# Patient Record
Sex: Female | Born: 1940 | Race: White | Hispanic: No | Smoking: Former smoker
Health system: Southern US, Community
[De-identification: ages and names within clinical notes are randomized; demographics above are authoritative.]

## PROBLEM LIST (undated history)

## (undated) DIAGNOSIS — E785 Hyperlipidemia, unspecified: Secondary | ICD-10-CM

## (undated) DIAGNOSIS — N3281 Overactive bladder: Secondary | ICD-10-CM

## (undated) DIAGNOSIS — K5790 Diverticulosis of intestine, part unspecified, without perforation or abscess without bleeding: Secondary | ICD-10-CM

## (undated) DIAGNOSIS — M810 Age-related osteoporosis without current pathological fracture: Secondary | ICD-10-CM

## (undated) DIAGNOSIS — F411 Generalized anxiety disorder: Secondary | ICD-10-CM

## (undated) DIAGNOSIS — N952 Postmenopausal atrophic vaginitis: Secondary | ICD-10-CM

## (undated) DIAGNOSIS — K635 Polyp of colon: Secondary | ICD-10-CM

## (undated) DIAGNOSIS — L409 Psoriasis, unspecified: Secondary | ICD-10-CM

## (undated) DIAGNOSIS — F17200 Nicotine dependence, unspecified, uncomplicated: Secondary | ICD-10-CM

## (undated) HISTORY — PX: TUBAL LIGATION: SHX77

## (undated) HISTORY — DX: Hyperlipidemia, unspecified: E78.5

## (undated) HISTORY — DX: Polyp of colon: K63.5

## (undated) HISTORY — DX: Nicotine dependence, unspecified, uncomplicated: F17.200

## (undated) HISTORY — DX: Generalized anxiety disorder: F41.1

## (undated) HISTORY — DX: Diverticulosis of intestine, part unspecified, without perforation or abscess without bleeding: K57.90

## (undated) HISTORY — DX: Psoriasis, unspecified: L40.9

## (undated) HISTORY — DX: Postmenopausal atrophic vaginitis: N95.2

## (undated) HISTORY — PX: APPENDECTOMY: SHX54

## (undated) HISTORY — DX: Overactive bladder: N32.81

## (undated) HISTORY — DX: Age-related osteoporosis without current pathological fracture: M81.0

---

## 1970-11-01 HISTORY — PX: ABDOMINAL HYSTERECTOMY: SHX81

## 1998-04-01 HISTORY — PX: PUBOVAGINAL SLING: SHX1035

## 1998-04-07 ENCOUNTER — Ambulatory Visit (HOSPITAL_COMMUNITY): Admission: RE | Admit: 1998-04-07 | Discharge: 1998-04-08 | Payer: Self-pay | Admitting: Urology

## 1999-03-24 ENCOUNTER — Ambulatory Visit (HOSPITAL_COMMUNITY): Admission: RE | Admit: 1999-03-24 | Discharge: 1999-03-24 | Payer: Self-pay | Admitting: Gastroenterology

## 1999-11-13 ENCOUNTER — Other Ambulatory Visit: Admission: RE | Admit: 1999-11-13 | Discharge: 1999-11-13 | Payer: Self-pay | Admitting: Obstetrics and Gynecology

## 2004-09-07 ENCOUNTER — Encounter: Admission: RE | Admit: 2004-09-07 | Discharge: 2004-09-07 | Payer: Self-pay | Admitting: Family Medicine

## 2004-12-25 HISTORY — PX: CARDIOVASCULAR STRESS TEST: SHX262

## 2005-04-23 ENCOUNTER — Ambulatory Visit (HOSPITAL_COMMUNITY): Admission: RE | Admit: 2005-04-23 | Discharge: 2005-04-23 | Payer: Self-pay | Admitting: Gastroenterology

## 2005-07-23 ENCOUNTER — Other Ambulatory Visit: Admission: RE | Admit: 2005-07-23 | Discharge: 2005-07-23 | Payer: Self-pay | Admitting: Family Medicine

## 2006-04-01 ENCOUNTER — Ambulatory Visit: Payer: Self-pay | Admitting: Family Medicine

## 2006-06-09 ENCOUNTER — Ambulatory Visit: Payer: Self-pay | Admitting: Family Medicine

## 2007-06-01 ENCOUNTER — Ambulatory Visit: Payer: Self-pay | Admitting: Family Medicine

## 2007-07-19 ENCOUNTER — Ambulatory Visit: Payer: Self-pay | Admitting: Family Medicine

## 2007-07-19 ENCOUNTER — Encounter: Admission: RE | Admit: 2007-07-19 | Discharge: 2007-07-19 | Payer: Self-pay | Admitting: Family Medicine

## 2007-08-23 ENCOUNTER — Ambulatory Visit: Payer: Self-pay | Admitting: Family Medicine

## 2008-02-01 ENCOUNTER — Ambulatory Visit: Payer: Self-pay | Admitting: Family Medicine

## 2008-04-18 ENCOUNTER — Ambulatory Visit: Payer: Self-pay | Admitting: Family Medicine

## 2008-07-29 ENCOUNTER — Ambulatory Visit: Payer: Self-pay | Admitting: Family Medicine

## 2008-11-28 ENCOUNTER — Ambulatory Visit: Payer: Self-pay | Admitting: Family Medicine

## 2009-01-27 ENCOUNTER — Ambulatory Visit: Payer: Self-pay | Admitting: Family Medicine

## 2009-07-10 ENCOUNTER — Ambulatory Visit: Payer: Self-pay | Admitting: Family Medicine

## 2009-08-14 ENCOUNTER — Ambulatory Visit: Payer: Self-pay | Admitting: Family Medicine

## 2010-05-19 ENCOUNTER — Ambulatory Visit: Payer: Self-pay | Admitting: Family Medicine

## 2010-06-03 ENCOUNTER — Ambulatory Visit: Payer: Self-pay | Admitting: Physician Assistant

## 2010-08-01 HISTORY — PX: COLONOSCOPY: SHX174

## 2010-08-31 LAB — HM COLONOSCOPY

## 2010-12-09 ENCOUNTER — Encounter: Payer: Self-pay | Admitting: Physician Assistant

## 2010-12-23 ENCOUNTER — Encounter (INDEPENDENT_AMBULATORY_CARE_PROVIDER_SITE_OTHER): Payer: Medicare Other | Admitting: Family Medicine

## 2010-12-23 DIAGNOSIS — E119 Type 2 diabetes mellitus without complications: Secondary | ICD-10-CM

## 2010-12-23 DIAGNOSIS — Z23 Encounter for immunization: Secondary | ICD-10-CM

## 2010-12-23 DIAGNOSIS — F329 Major depressive disorder, single episode, unspecified: Secondary | ICD-10-CM

## 2010-12-23 DIAGNOSIS — E7800 Pure hypercholesterolemia, unspecified: Secondary | ICD-10-CM

## 2011-03-19 NOTE — Op Note (Signed)
Marissa Oliver, Marissa Oliver                ACCOUNT NO.:  [id number]   MEDICAL RECORD NO.:  [id number]          PATIENT TYPE:  AMB   LOCATION:  ENDO                         FACILITY:  [facility]   PHYSICIAN:  Anselmo Rod, M.D.  DATE OF BIRTH:  [birth year]   DATE OF PROCEDURE:  04/23/2005  DATE OF DISCHARGE:                                 OPERATIVE REPORT   PROCEDURE PERFORMED:  Colonoscopy.   ENDOSCOPIST:  Charna Elizabeth, M.D.   INSTRUMENT USED:  Olympus video colonoscope.   INDICATIONS FOR PROCEDURE:  The patient is a 70 year old white female with a  personal history of polyps, undergoing a repeat colonoscopy to rule out  recurrent polyps.   PREPROCEDURE PREPARATION:  Informed consent was procured from the patient.  The patient was fasted for eight hours prior to the procedure and prepped  with a bottle of magnesium citrate and a gallon of GoLYTELY the night prior  to the procedure.  The risks and benefits of the procedure including a 10%  miss rate for colon polyps or cancers was discussed with the patient as  well.   PREPROCEDURE PHYSICAL:  The patient had stable vital signs.  Neck supple.  Chest clear to auscultation.  S1 and S2 regular.  Abdomen soft with normal  bowel sounds.   DESCRIPTION OF PROCEDURE:  The patient was placed in left lateral decubitus  position and sedated with 100 mg of Demerol and 10 mg of Versed in slow  incremental doses.  Once the patient was adequately sedated and maintained  on low flow oxygen and continuous cardiac monitoring, the Olympus video  colonoscope was advanced from the rectum to the cecum.  The appendicular  orifice and ileocecal valve were visualized and photographed.  There was  some inspissated stool in the appendicular orifice.  A few sigmoid  diverticula were seen, small internal hemorrhoids were appreciated on  retroflexion in the rectum.  No masses or polyps were identified.  The  patient tolerated the procedure well without  complication.   IMPRESSION:  1.  Small internal hemorrhoids.  2.  Sigmoid diverticulosis.  3.  No masses or polyps seen.   RECOMMENDATIONS:  1.  Continue high fiber diet with liberal fluid intake.  Brochures on      diverticulosis have been given to the patient for her education.  2.  Repeat colonoscopy is recommended in the next five years considering her      history of colonic polyps.  3.  She is to contact the office if she develops any abnormal symptoms prior      to that so that early evaluation can be undertaken.  4.  Outpatient followup as need arises in the future.       JNM/MEDQ  D:  04/23/2005  T:  04/23/2005  Job:  [id number]   cc:   Sharlot Gowda, M.D.  [address]  [place], [place] [address]  Fax: [phone]

## 2011-07-23 ENCOUNTER — Encounter: Payer: Self-pay | Admitting: Family Medicine

## 2011-07-26 ENCOUNTER — Other Ambulatory Visit: Payer: Medicare Other

## 2011-07-26 DIAGNOSIS — I10 Essential (primary) hypertension: Secondary | ICD-10-CM

## 2011-07-26 DIAGNOSIS — E119 Type 2 diabetes mellitus without complications: Secondary | ICD-10-CM

## 2011-07-26 DIAGNOSIS — E785 Hyperlipidemia, unspecified: Secondary | ICD-10-CM

## 2011-07-26 LAB — COMPREHENSIVE METABOLIC PANEL
BUN: 16 mg/dL (ref 6–23)
CO2: 25 mEq/L (ref 19–32)
Calcium: 9.6 mg/dL (ref 8.4–10.5)
Chloride: 103 mEq/L (ref 96–112)
Creat: 0.76 mg/dL (ref 0.50–1.10)
Total Bilirubin: 0.5 mg/dL (ref 0.3–1.2)

## 2011-07-26 LAB — LIPID PANEL
Cholesterol: 182 mg/dL (ref 0–200)
HDL: 53 mg/dL (ref >39)
Total CHOL/HDL Ratio: 3.4 Ratio
Triglycerides: 137 mg/dL (ref <150)
VLDL: 27 mg/dL (ref 0–40)

## 2011-07-27 NOTE — Progress Notes (Signed)
Patient scheduled for OV 08/11/11 @ 10:15.

## 2011-08-02 ENCOUNTER — Encounter: Payer: Self-pay | Admitting: Cardiovascular Disease

## 2011-08-12 ENCOUNTER — Ambulatory Visit (INDEPENDENT_AMBULATORY_CARE_PROVIDER_SITE_OTHER): Payer: Medicare Other | Admitting: Family Medicine

## 2011-08-12 ENCOUNTER — Encounter: Payer: Self-pay | Admitting: Family Medicine

## 2011-08-12 VITALS — BP 108/68 | HR 68 | Ht 62.0 in | Wt 175.0 lb

## 2011-08-12 DIAGNOSIS — Z23 Encounter for immunization: Secondary | ICD-10-CM

## 2011-08-12 DIAGNOSIS — F411 Generalized anxiety disorder: Secondary | ICD-10-CM

## 2011-08-12 DIAGNOSIS — M810 Age-related osteoporosis without current pathological fracture: Secondary | ICD-10-CM | POA: Insufficient documentation

## 2011-08-12 DIAGNOSIS — E7800 Pure hypercholesterolemia, unspecified: Secondary | ICD-10-CM

## 2011-08-12 DIAGNOSIS — E119 Type 2 diabetes mellitus without complications: Secondary | ICD-10-CM | POA: Insufficient documentation

## 2011-08-12 MED ORDER — PRAVASTATIN SODIUM 80 MG PO TABS: 80.0000 mg | ORAL_TABLET | Freq: Every day | ORAL | Status: DC

## 2011-08-12 MED ORDER — METFORMIN HCL 500 MG PO TABS: 500.0000 mg | ORAL_TABLET | Freq: Every day | ORAL | Status: DC

## 2011-08-12 MED ORDER — CITALOPRAM HYDROBROMIDE 20 MG PO TABS: 20.0000 mg | ORAL_TABLET | Freq: Every day | ORAL | Status: DC

## 2011-08-12 MED ORDER — ALENDRONATE SODIUM 70 MG PO TABS: 70.0000 mg | ORAL_TABLET | ORAL | Status: DC

## 2011-08-12 NOTE — Patient Instructions (Signed)
Please schedule your bone density test Please work on cutting back on your smoking, and quitting entirely Please try and watch the cholesterol a little more closely in your diet-- your cholesterol was higher than at last check  Low-Fat, Low-Saturated-Fat, Low-Cholesterol Diets Food Selection Guide BREADS, CEREALS, PASTA, RICE, DRIED PEAS, AND BEANS These products are high in carbohydrates and most are low in fat. Therefore, they can be increased in the diet as substitutes for fatty foods. They too, however, contain calories and should not be eaten in excess. Cereals can be eaten for snacks as well as for breakfast.  Include foods that contain fiber (fruits, vegetables, whole grains, and legumes). Research shows that fiber may lower blood cholesterol levels, especially the water-soluble fiber found in fruits, vegetables, oat products, and legumes. FRUITS AND VEGETABLES It is good to eat fruits and vegetables. Besides being sources of fiber, both are rich in vitamins and some minerals. They help you get the daily allowances of these nutrients. Fruits and vegetables can be used for snacks and desserts. MEATS Limit lean meat, chicken, [place], and fish to no more than 6 ounces per day. Beef, Pork, and Lamb  Use lean cuts of beef, pork, and lamb. Lean cuts include:   Extra-lean ground beef.   Arm roast.   Sirloin tip.   Center-cut ham.   Round steak.   Loin chops.   Rump roast.   Tenderloin.  Trim all fat off the outside of meats before cooking. It is not necessary to severely decrease the intake of red meat, but lean choices should be made. Lean meat is rich in protein and contains a highly absorbable form of iron. Premenopausal women, in particular, should avoid reducing lean red meat because this could increase the risk for low red blood cells (iron-deficiency anemia). Processed Meats Processed meats, such as bacon, bologna, salami, sausage, and hot dogs contain large quantities of  fat, are not rich in valuable nutrients, and should not be eaten very often. Organ Meats The organ meats, such as liver, sweetbreads, kidneys, and brain are very rich in cholesterol. They should be limited. Chicken and [place] These are good sources of protein. The fat of poultry can be reduced by removing the skin and underlying fat layers before cooking. Chicken and [place] can be substituted for lean red meat in the diet. Poultry should not be fried or covered with high-fat sauces. Fish and Shellfish Fish is a good source of protein. Shellfish contain cholesterol, but they usually are low in saturated fatty acids. The preparation of fish is important. Like chicken and [place], they should not be fried or covered with high-fat sauces. EGGS Egg yolks often are hidden in cooked and processed foods. Egg whites contain no fat or cholesterol. They can be eaten often. Try 1 to 2 egg whites instead of whole eggs in recipes or use egg substitutes that do not contain yolk. MILK AND DAIRY PRODUCTS Use skim or 1% milk instead of 2% or whole milk. Decrease whole milk, natural, and processed cheeses. Use nonfat or low-fat (2%) cottage cheese or low-fat cheeses made from vegetable oils. Choose nonfat or low-fat (1 to 2%) yogurt. Experiment with evaporated skim milk in recipes that call for heavy cream. Substitute low-fat yogurt or low-fat cottage cheese for sour cream in dips and salad dressings. Have at least 2 servings of low-fat dairy products, such as 2 glasses of skim (or 1%) milk each day to help get your daily calcium intake. FATS AND OILS Reduce the  total intake of fats, especially saturated fat. Butterfat, lard, and beef fats are high in saturated fat and cholesterol. These should be avoided as much as possible. Vegetable fats do not contain cholesterol, but certain vegetable fats, such as coconut oil, palm oil, and palm kernel oil are very high in saturated fats. These should be limited. These fats are often  used in bakery goods, processed foods, popcorn, oils, and nondairy creamers. Vegetable shortenings and some peanut butters contain hydrogenated oils, which are also saturated fats. Read the labels on these foods and check for saturated vegetable oils. Unsaturated vegetable oils and fats do not raise blood cholesterol. However, they should be limited because they are fats and are high in calories. Total fat should still be limited to 30% of your daily caloric intake. Desirable liquid vegetable oils are corn oil, cottonseed oil, olive oil, canola oil, safflower oil, soybean oil, and sunflower oil. Peanut oil is not as good, but small amounts are acceptable. Buy a heart-healthy tub margarine that has no partially hydrogenated oils in the ingredients. Mayonnaise and salad dressings often are made from unsaturated fats, but they should also be limited because of their high calorie and fat content. Seeds, nuts, peanut butter, olives, and avocados are high in fat, but the fat is mainly the unsaturated type. These foods should be limited mainly to avoid excess calories and fat. OTHER EATING TIPS Snacks  Most sweets should be limited as snacks. They tend to be rich in calories and fats, and their caloric content outweighs their nutritional value. Some good choices in snacks are graham crackers, melba toast, soda crackers, bagels (no egg), English muffins, fruits, and vegetables. These snacks are preferable to snack crackers, [place] fries, and chips. Popcorn should be air-popped or cooked in small amounts of liquid vegetable oil. Desserts Eat fruit, low-fat yogurt, and fruit ices instead of pastries, cake, and cookies. Sherbet, angel food cake, gelatin dessert, frozen low-fat yogurt, or other frozen products that do not contain saturated fat (pure fruit juice bars, frozen ice pops) are also acceptable.  COOKING METHODS Choose those methods that use little or no fat. They include:  Poaching.   Braising.    Steaming.   Grilling.   Baking.   Stir-frying.   Broiling.   Microwaving.  Foods can be cooked in a nonstick pan without added fat, or use a nonfat cooking spray in regular cookware. Limit fried foods and avoid frying in saturated fat. Add moisture to lean meats by using water, broth, cooking wines, and other nonfat or low-fat sauces along with the cooking methods mentioned above. Soups and stews should be chilled after cooking. The fat that forms on top after a few hours in the refrigerator should be skimmed off. When preparing meals, avoid using excess salt. Salt can contribute to raising blood pressure in some people. EATING AWAY FROM HOME Order entres, potatoes, and vegetables without sauces or butter. When meat exceeds the size of a deck of cards (3 to 4 ounces), the rest can be taken home for another meal. Choose vegetable or fruit salads and ask for low-calorie salad dressings to be served on the side. Use dressings sparingly. Limit high-fat toppings, such as bacon, crumbled eggs, cheese, sunflower seeds, and olives. Ask for heart-healthy tub margarine instead of butter. Document Released: 04/09/2002 Document Re-Released: 01/12/2010 [facility] Patient Information 2011 [place], [place].

## 2011-08-12 NOTE — Progress Notes (Signed)
Patient presents for med check.  She was last seen in February (late for her 6 month f/u). She continues to smoke.  Ex-husband has stage 3 lung cancer.  Smokes a couple of packs each week, and has questions regarding nicotine patches.  Diabetes follow-up:  Blood sugars are not checked.  Denies hypoglycemia.  Denies polydipsia and polyuria.  Last eye exam was January 2012.  Patient follows a low sugar diet and checks feet regularly without concerns.  Reports some throbbing pain in her left lateral calf.  Notices when she is sleeping at night, and sometimes during the day, and sometimes gets cramps in her feet.  Seeing Dr. Elease Hashimoto next week because she is having some palpitations and heart fluttering.  Drinking 4-5 cups/day.  Hyperlipidemia follow-up:  Patient admits to not following a low-fat, low cholesterol diet.  Compliant with medications and denies medication side effects. Admits to eating too many sweets and high fat foods.  Cholesterol went up 20 points since last check in February, and LDL is now 102.  Eggs once a week; Red meats 2-3x/week; +ranch dressing  Anxiety/depression is stable.  Continues to be easily irritated by her brother, but overall is doing fine.  Past Medical History  Diagnosis Date  . Dyslipidemia   . Diabetes mellitus 2005  . Smoker   . Hemorrhoids   . OAB (overactive bladder)   . Osteoporosis 07/2009  . GAD (generalized anxiety disorder)   . Diverticulosis   . Colon polyp     tubular adenoma (colonoscopy q5 yrs)  . Atrophic vaginitis     estrace cream rx'd by Dr. Vernie Ammons    Past Surgical History  Procedure Date  . Abdominal hysterectomy 1972    partial  . Pubovaginal sling 04/1998  . Tubal ligation   . Appendectomy   . Cardiovascular stress test 12/25/2004    EF 65%. OVERALL NORMAL STRESS CARDIOLITE. NO EVIDENCE  OF ISCHEMIA  . Colonoscopy 08/2010    History   Social History  . Marital Status: Single    Spouse Name: N/A    Number of Children: N/A  .  Years of Education: N/A   Occupational History  . Not on file.   Social History Main Topics  . Smoking status: Current Everyday Smoker -- 0.5 packs/day  . Smokeless tobacco: Never Used  . Alcohol Use: No  . Drug Use: No  . Sexually Active: Not on file   Other Topics Concern  . Not on file   Social History Narrative  . No narrative on file    Family History  Problem Relation Age of Onset  . Arthritis Mother   . Heart failure Mother   . Mental illness Father   . Kidney disease Father   . Stroke Father   . Heart attack Father   . Heart disease Brother   . Stroke Maternal Aunt   . Cancer Maternal Uncle   . Stroke Paternal Grandmother    Current outpatient prescriptions:alendronate (FOSAMAX) 70 MG tablet, Take 1 tablet (70 mg total) by mouth every 7 (seven) days. Take with a full glass of water on an empty stomach., Disp: 12 tablet, Rfl: 1;  citalopram (CELEXA) 20 MG tablet, Take 1 tablet (20 mg total) by mouth daily., Disp: 90 tablet, Rfl: 1;  metFORMIN (GLUCOPHAGE) 500 MG tablet, Take 1 tablet (500 mg total) by mouth daily., Disp: 90 tablet, Rfl: 1 pravastatin (PRAVACHOL) 80 MG tablet, Take 1 tablet (80 mg total) by mouth daily., Disp: 90 tablet, Rfl:  1;  DISCONTD: citalopram (CELEXA) 20 MG tablet, Take 20 mg by mouth daily.  , Disp: , Rfl: ;  DISCONTD: metFORMIN (GLUCOPHAGE) 500 MG tablet, Take 500 mg by mouth daily.  , Disp: , Rfl: ;  DISCONTD: pravastatin (PRAVACHOL) 80 MG tablet, Take 80 mg by mouth daily.  , Disp: , Rfl:   No Known Allergies  ROS: Denies fevers, URI symptoms, cough, shortness of breath, chest pain. + palpitations.  Denies swelling in feet, nausea, vomiting, diarrhea, skin rashes (other than having psoriasis), depression   PHYSICAL EXAM: BP 108/68  Pulse 68  Ht 5\' 2"  (1.575 m)  Wt 175 lb (79.379 kg)  BMI 32.01 kg/m2 Well developed, pleasant, overweight female in no distress Neck: no lymphadenopathy or thyromegaly Heart: regular rate and rhythm without  murmur Lungs: clear bilaterally Abdomen: soft, nontender, no organomegaly or mass Extremities: 2+ DP and PT pulses, no edema Skin: multiple erythematous papules and plaques, somewhat excoriated (consistent with her psoriasis) Psych: normal mood, affect, hygiene and grooming.  Slightly irritable in discussing politics today  ASSESSMENT/PLAN:  1. Type II or unspecified type diabetes mellitus without mention of complication, not stated as uncontrolled  metFORMIN (GLUCOPHAGE) 500 MG tablet, Hemoglobin A1c, Glucose, random  2. Need for prophylactic vaccination and inoculation against influenza  Flu vaccine greater than or equal to 3yo preservative free IM  3. Osteoporosis  alendronate (FOSAMAX) 70 MG tablet  4. Anxiety state, unspecified  citalopram (CELEXA) 20 MG tablet  5. Pure hypercholesterolemia  pravastatin (PRAVACHOL) 80 MG tablet, Lipid panel, Hepatic function panel   Low cholesterol diet reviewed.  Since lipids were at goal 8 months ago on same meds, will try dietary improvement rather than changing medication. Smoking cessation counseling given Recommended she gradually cut back on caffeine Schedule bone density test  6 months--recheck FLP, hepatic panel and glucose prior to OV

## 2011-08-17 ENCOUNTER — Institutional Professional Consult (permissible substitution): Payer: Medicare Other | Admitting: Cardiovascular Disease

## 2011-09-15 ENCOUNTER — Institutional Professional Consult (permissible substitution): Payer: Medicare Other | Admitting: Cardiovascular Disease

## 2011-10-22 ENCOUNTER — Emergency Department (HOSPITAL_COMMUNITY)
Admission: EM | Admit: 2011-10-22 | Discharge: 2011-10-23 | Disposition: A | Discharge status: Home / Self Care | Payer: Medicare Other | Attending: Emergency Medicine | Admitting: Emergency Medicine

## 2011-10-22 ENCOUNTER — Emergency Department (HOSPITAL_COMMUNITY): Payer: Medicare Other

## 2011-10-22 ENCOUNTER — Encounter (HOSPITAL_COMMUNITY): Payer: Self-pay | Admitting: *Deleted

## 2011-10-22 DIAGNOSIS — M79609 Pain in unspecified limb: Secondary | ICD-10-CM | POA: Insufficient documentation

## 2011-10-22 DIAGNOSIS — F17200 Nicotine dependence, unspecified, uncomplicated: Secondary | ICD-10-CM | POA: Insufficient documentation

## 2011-10-22 DIAGNOSIS — E119 Type 2 diabetes mellitus without complications: Secondary | ICD-10-CM | POA: Insufficient documentation

## 2011-10-22 DIAGNOSIS — M79604 Pain in right leg: Secondary | ICD-10-CM

## 2011-10-22 DIAGNOSIS — M549 Dorsalgia, unspecified: Secondary | ICD-10-CM

## 2011-10-22 DIAGNOSIS — K449 Diaphragmatic hernia without obstruction or gangrene: Secondary | ICD-10-CM | POA: Insufficient documentation

## 2011-10-22 DIAGNOSIS — R112 Nausea with vomiting, unspecified: Secondary | ICD-10-CM | POA: Insufficient documentation

## 2011-10-22 DIAGNOSIS — R1031 Right lower quadrant pain: Secondary | ICD-10-CM | POA: Insufficient documentation

## 2011-10-22 LAB — COMPREHENSIVE METABOLIC PANEL
ALT: 15 U/L (ref 0–35)
AST: 14 U/L (ref 0–37)
Albumin: 4.2 g/dL (ref 3.5–5.2)
Alkaline Phosphatase: 99 U/L (ref 39–117)
BUN: 17 mg/dL (ref 6–23)
CO2: 27 mEq/L (ref 19–32)
Calcium: 9.9 mg/dL (ref 8.4–10.5)
Chloride: 102 mEq/L (ref 96–112)
Creatinine, Ser: 0.7 mg/dL (ref 0.50–1.10)
GFR calc Af Amer: >90 mL/min (ref >90)
GFR calc non Af Amer: 85 mL/min — ABNORMAL LOW (ref >90)
Glucose, Bld: 153 mg/dL — ABNORMAL HIGH (ref 70–99)
Potassium: 3.7 mEq/L (ref 3.5–5.1)
Sodium: 138 mEq/L (ref 135–145)
Total Bilirubin: 0.3 mg/dL (ref 0.3–1.2)
Total Protein: 7.2 g/dL (ref 6.0–8.3)

## 2011-10-22 LAB — URINALYSIS, ROUTINE W REFLEX MICROSCOPIC
Bilirubin Urine: NEGATIVE
Glucose, UA: NEGATIVE mg/dL
Hgb urine dipstick: NEGATIVE
Ketones, ur: 15 mg/dL — AB
Leukocytes, UA: NEGATIVE
Nitrite: NEGATIVE
Protein, ur: NEGATIVE mg/dL
Specific Gravity, Urine: 1.015 (ref 1.005–1.030)
Urobilinogen, UA: 0.2 mg/dL (ref 0.0–1.0)
pH: 8 (ref 5.0–8.0)

## 2011-10-22 LAB — CBC
HCT: 40 % (ref 36.0–46.0)
Hemoglobin: 13.9 g/dL (ref 12.0–15.0)
MCH: 29.7 pg (ref 26.0–34.0)
MCHC: 34.8 g/dL (ref 30.0–36.0)
MCV: 85.5 fL (ref 78.0–100.0)
Platelets: 191 10*3/uL (ref 150–400)
RBC: 4.68 MIL/uL (ref 3.87–5.11)
RDW: 13.3 % (ref 11.5–15.5)
WBC: 6.7 10*3/uL (ref 4.0–10.5)

## 2011-10-22 LAB — LACTIC ACID, PLASMA: Lactic Acid, Venous: 1 mmol/L (ref 0.5–2.2)

## 2011-10-22 LAB — LIPASE, BLOOD: Lipase: 12 U/L (ref 11–59)

## 2011-10-22 MED ORDER — ONDANSETRON HCL 4 MG/2ML IJ SOLN
INTRAMUSCULAR | Status: AC
  Filled 2011-10-22: qty 2

## 2011-10-22 MED ORDER — SODIUM CHLORIDE 0.9 % IV BOLUS (SEPSIS)
1000.0000 mL | Freq: Once | INTRAVENOUS | Status: AC
  Administered 2011-10-22: 1000 mL via INTRAVENOUS

## 2011-10-22 MED ORDER — HYDROMORPHONE HCL PF 2 MG/ML IJ SOLN
2.0000 mg | Freq: Once | INTRAMUSCULAR | Status: AC
  Administered 2011-10-22: 2 mg via INTRAMUSCULAR
  Filled 2011-10-22: qty 1

## 2011-10-22 MED ORDER — HYDROMORPHONE HCL PF 1 MG/ML IJ SOLN
1.0000 mg | Freq: Once | INTRAMUSCULAR | Status: AC
  Administered 2011-10-23: 1 mg via INTRAVENOUS
  Filled 2011-10-22: qty 1

## 2011-10-22 MED ORDER — IOHEXOL 300 MG/ML  SOLN
100.0000 mL | Freq: Once | INTRAMUSCULAR | Status: AC | PRN
  Administered 2011-10-22: 100 mL via INTRAVENOUS

## 2011-10-22 MED ORDER — HYDROMORPHONE HCL PF 1 MG/ML IJ SOLN
1.0000 mg | Freq: Once | INTRAMUSCULAR | Status: AC
  Administered 2011-10-22: 1 mg via INTRAVENOUS
  Filled 2011-10-22: qty 1

## 2011-10-22 MED ORDER — DIAZEPAM 5 MG/ML IJ SOLN
2.5000 mg | Freq: Once | INTRAMUSCULAR | Status: AC
  Administered 2011-10-23: 2.5 mg via INTRAVENOUS
  Filled 2011-10-22: qty 2

## 2011-10-22 MED ORDER — OXYCODONE-ACETAMINOPHEN 5-325 MG PO TABS: 1.0000 | ORAL_TABLET | ORAL | Status: AC | PRN

## 2011-10-22 NOTE — ED Provider Notes (Signed)
History    70yF with poorly localized pain to R hip/R lower back/RLQ. Onset around 1500 today. Gradual onset. Doesn't remember what was doing when first noticed. "it just hurts." Denies trauma. No urinary complaints. Nausea and vomiting x1 when pain was severe. Sometimes worse with movement of hip. Currently denies. NO hx of similar pain. No fever or chills. No numbness, weakness or tingling. Not on blood thinning medications. Denies hx of cancer. denies hx of spinal surgery.  CSN: [id number]  Arrival date & time 10/22/11  1821   First MD Initiated Contact with Patient 10/22/11 2048      Chief Complaint  Patient presents with  . Abdominal Pain    (Consider location/radiation/quality/duration/timing/severity/associated sxs/prior treatment) HPI  Past Medical History  Diagnosis Date  . Dyslipidemia   . Diabetes mellitus 2005  . Smoker   . Hemorrhoids   . OAB (overactive bladder)   . Osteoporosis 07/2009  . GAD (generalized anxiety disorder)   . Diverticulosis   . Colon polyp     tubular adenoma (colonoscopy q5 yrs)  . Atrophic vaginitis     estrace cream rx'd by Dr. Vernie Ammons    Past Surgical History  Procedure Date  . Abdominal hysterectomy 1972    partial  . Pubovaginal sling 04/1998  . Tubal ligation   . Appendectomy   . Cardiovascular stress test 12/25/2004    EF 65%. OVERALL NORMAL STRESS CARDIOLITE. NO EVIDENCE  OF ISCHEMIA  . Colonoscopy 08/2010    Family History  Problem Relation Age of Onset  . Arthritis Mother   . Heart failure Mother   . Mental illness Father   . Kidney disease Father   . Stroke Father   . Heart attack Father   . Heart disease Brother   . Stroke Maternal Aunt   . Cancer Maternal Uncle   . Stroke Paternal Grandmother     History  Substance Use Topics  . Smoking status: Current Everyday Smoker -- 0.5 packs/day  . Smokeless tobacco: Never Used  . Alcohol Use: No    OB History    Grav Para Term Preterm Abortions TAB SAB Ect Mult  Living                  Review of Systems   Review of symptoms negative unless otherwise noted in HPI.   Allergies  Review of patient's allergies indicates no known allergies.  Home Medications   Current Outpatient Rx  Name Route Sig Dispense Refill  . METFORMIN HCL 500 MG PO TABS Oral Take 1 tablet (500 mg total) by mouth daily. 90 tablet 1  . PRAVASTATIN SODIUM 80 MG PO TABS Oral Take 1 tablet (80 mg total) by mouth daily. 90 tablet 1  . ALENDRONATE SODIUM 70 MG PO TABS Oral Take 1 tablet (70 mg total) by mouth every 7 (seven) days. Take with a full glass of water on an empty stomach. 12 tablet 1  . CITALOPRAM HYDROBROMIDE 20 MG PO TABS Oral Take 1 tablet (20 mg total) by mouth daily. 90 tablet 1    BP 120/48  Pulse 69  Temp(Src) 98.3 F (36.8 C) (Oral)  Resp 22  SpO2 94%  Physical Exam  Nursing note and vitals reviewed. Constitutional: She appears well-developed and well-nourished. No distress.  HENT:  Head: Normocephalic and atraumatic.  Eyes: Conjunctivae are normal. Pupils are equal, round, and reactive to light. Right eye exhibits no discharge. Left eye exhibits no discharge.  Neck: Neck supple.  Cardiovascular: Normal rate,  regular rhythm and normal heart sounds.  Exam reveals no gallop and no friction rub.   No murmur heard. Pulmonary/Chest: Effort normal and breath sounds normal. No respiratory distress.  Abdominal: Soft. She exhibits no distension and no mass. There is no tenderness. There is no guarding.       Abdomen soft and nontender. No masses palpated. No hernia appreciated.  Musculoskeletal: She exhibits no edema.       Moderate tenderness in area of R SI joint and laterally. No skin changes. No midline spinal tenderness. Neg straight leg test. No sigificant increase in pain with ROM of R hip.  Neurological: She is alert. No cranial nerve deficit. She exhibits normal muscle tone. Coordination normal.  Skin: Skin is warm and dry. She is not diaphoretic.    Psychiatric: She has a normal mood and affect. Her behavior is normal. Thought content normal.    ED Course  Procedures (including critical care time)  Labs Reviewed  URINALYSIS, ROUTINE W REFLEX MICROSCOPIC - Abnormal; Notable for the following:    APPearance CLOUDY (*)    Ketones, ur 15 (*)    All other components within normal limits  COMPREHENSIVE METABOLIC PANEL - Abnormal; Notable for the following:    Glucose, Bld 153 (*)    GFR calc non Af Amer 85 (*)    All other components within normal limits  CBC  LIPASE, BLOOD  LACTIC ACID, PLASMA   Ct Abdomen Pelvis W Contrast  10/22/2011  *RADIOLOGY REPORT*  Clinical Data: Right lower quadrant pain.  Nausea and vomiting.  CT ABDOMEN AND PELVIS WITH CONTRAST  Technique:  Multidetector CT imaging of the abdomen and pelvis was performed following the standard protocol during bolus administration of intravenous contrast.  Contrast: OMNIPAQUE IOHEXOL 300 MG/ML IV SOLN  Comparison: None.  Findings: A small hiatal hernia noted.  The abdominal parenchymal organs are normal in appearance.  Gallbladder is unremarkable.  No evidence of hydronephrosis.  No soft tissue masses or lymphadenopathy identified within the abdomen or pelvis.  Previous hysterectomy noted.  No evidence of inflammatory process or abnormal fluid collections.  Appendix is not well visualized, however there is no evidence of inflammatory process in the area the cecum.  No evidence of bowel wall thickening or dilatation. Severe pubic symphysis degenerative changes noted.  IMPRESSION:  1.  No evidence of appendicitis or other acute findings. 2.  Small hiatal hernia. 3.  Severe pubic symphysis degenerative changes.  Original Report Authenticated By: Danae Orleans, M.D.    11:41 PM Pt requesting additional pain meds prior to DC. I think this is reasonable. Pt has tolerated 3mg  dilaudid thus far with no significant CNS or respiratory depression. Discussed findings with pt and  family. Understand need for close fu. Strict return precautions discussed. Questions answered their satisfaction.  1. Abdominal pain   2. Back pain   3. Leg pain, right       MDM  70yf with R lower back/flank/abdominal pain. Conside AAA, renal colic, appy, colitis, sciatica, uti, cauda equina, pathologic fx/sprain/sciatica or other musculoskeletal etiology. CT unremarkable and not suggestive of etiology. UA not suggestive of infection. Pain not reproducible on exam so doubt fx, sciatica. No neuro complaints and non ocal neuro exam to suggest cauda equina. Pain is much more lateral as well. Suspect musculoskeletal given pain seems to be somewhat worse when pt flexes hip. Pt afebrile and nontoxic. Appears fairly comfortable when at rest. Plan symptomatic control at this time and outpt fu.  Raeford Razor, MD 10/22/11 [phone]

## 2011-10-22 NOTE — ED Notes (Addendum)
Per EMS- pt in c/o right leg pain radiating into back and abdomen, also n/v, general illness today, pt with 20g in LAC saline lock, given 4mg  zofran PTA- [facility] 119 for EMS

## 2011-11-18 LAB — HM DEXA SCAN

## 2011-12-02 ENCOUNTER — Encounter: Payer: Self-pay | Admitting: *Deleted

## 2011-12-02 ENCOUNTER — Telehealth: Payer: Self-pay | Admitting: *Deleted

## 2011-12-02 NOTE — Telephone Encounter (Signed)
Called patient and let her know that Dr.Knapp reviewed her Dexa results and she still has osteopenia, not osteoporosis. Continue alendronate, Ca, Vitamin D and exercise. Quit smoking. Recheck 2 years.

## 2012-02-14 ENCOUNTER — Other Ambulatory Visit: Payer: Medicare Other

## 2012-02-14 DIAGNOSIS — E7800 Pure hypercholesterolemia, unspecified: Secondary | ICD-10-CM

## 2012-02-14 DIAGNOSIS — E119 Type 2 diabetes mellitus without complications: Secondary | ICD-10-CM

## 2012-02-15 LAB — LIPID PANEL
HDL: 44 mg/dL (ref >39)
LDL Cholesterol: 93 mg/dL (ref 0–99)
Triglycerides: 169 mg/dL — ABNORMAL HIGH (ref <150)
VLDL: 34 mg/dL (ref 0–40)

## 2012-02-15 LAB — HEPATIC FUNCTION PANEL
ALT: 18 U/L (ref 0–35)
Albumin: 5 g/dL (ref 3.5–5.2)
Bilirubin, Direct: 0.1 mg/dL (ref 0.0–0.3)
Total Protein: 7.1 g/dL (ref 6.0–8.3)

## 2012-03-01 ENCOUNTER — Ambulatory Visit (INDEPENDENT_AMBULATORY_CARE_PROVIDER_SITE_OTHER): Payer: Medicare Other | Admitting: Family Medicine

## 2012-03-01 ENCOUNTER — Encounter: Payer: Self-pay | Admitting: Family Medicine

## 2012-03-01 VITALS — BP 118/76 | HR 68 | Ht 62.0 in | Wt 172.0 lb

## 2012-03-01 DIAGNOSIS — F411 Generalized anxiety disorder: Secondary | ICD-10-CM

## 2012-03-01 DIAGNOSIS — Z23 Encounter for immunization: Secondary | ICD-10-CM

## 2012-03-01 DIAGNOSIS — M810 Age-related osteoporosis without current pathological fracture: Secondary | ICD-10-CM

## 2012-03-01 DIAGNOSIS — N390 Urinary tract infection, site not specified: Secondary | ICD-10-CM

## 2012-03-01 DIAGNOSIS — R300 Dysuria: Secondary | ICD-10-CM

## 2012-03-01 DIAGNOSIS — E7800 Pure hypercholesterolemia, unspecified: Secondary | ICD-10-CM

## 2012-03-01 DIAGNOSIS — F17200 Nicotine dependence, unspecified, uncomplicated: Secondary | ICD-10-CM

## 2012-03-01 DIAGNOSIS — E119 Type 2 diabetes mellitus without complications: Secondary | ICD-10-CM

## 2012-03-01 LAB — POCT URINALYSIS DIPSTICK
Bilirubin, UA: NEGATIVE
Glucose, UA: NEGATIVE
Ketones, UA: NEGATIVE
Nitrite, UA: NEGATIVE
Spec Grav, UA: 1.015
pH, UA: 6

## 2012-03-01 MED ORDER — PRAVASTATIN SODIUM 80 MG PO TABS: 80.0000 mg | ORAL_TABLET | Freq: Every day | ORAL | Status: DC

## 2012-03-01 MED ORDER — SULFAMETHOXAZOLE-TRIMETHOPRIM 800-160 MG PO TABS: 1.0000 | ORAL_TABLET | Freq: Two times a day (BID) | ORAL | Status: AC

## 2012-03-01 MED ORDER — METFORMIN HCL 500 MG PO TABS: 500.0000 mg | ORAL_TABLET | Freq: Every day | ORAL | Status: DC

## 2012-03-01 MED ORDER — ALENDRONATE SODIUM 70 MG PO TABS: 70.0000 mg | ORAL_TABLET | ORAL | Status: DC

## 2012-03-01 MED ORDER — CITALOPRAM HYDROBROMIDE 20 MG PO TABS: 20.0000 mg | ORAL_TABLET | Freq: Every day | ORAL | Status: DC

## 2012-03-01 NOTE — Patient Instructions (Addendum)
Schedule annual diabetic eye exam.  Try and cut back on sweets and sugar in your diet.  For now, the medications seem to be controlling the diabetes and cholesterol (triglycerides were a little high, due to the sweets).  After completing antibiotics for urinary infection, if you are still having symptoms, schedule office visit with Dr. Lynelle Doctor.  If your symptoms have resolved, then schedule a nurse visit to have your urine rechecked.  Please quit smoking!  [facility] has free smoking cessation classes, and you can get free counseling through 1-800-QUITNOW.

## 2012-03-01 NOTE — Progress Notes (Signed)
Chief complaint: lab follow up/med check. Also complains of burning when she urinates x 10 days  HPI:  Diabetes follow-up:  Blood sugars are not checked.  Denies hypoglycemia.  Denies polydipsia and polyuria.  Last eye exam was 15 months ago.  She checks feet regularly without concerns.  Hyperlipidemia follow-up:  Compliant with medications and denies medication side effects.  Doesn't particularly watch her diet.  Admits to eating sweets, candy, ice cream  Dysuria:  Started while she was out of town. Thought it was related to vaginal dryness.  Symptoms got better, then it seemed to get worse. +dysuria, and some urgency and frequency.  She had seen Dr. Vernie Ammons who had prescribed a vaginal cream but she couldn't afford it ($100/tube).  Hadn't bothered her much, until she was out of town. Denies vaginal discharge.  Anxiety--reports that citalopram works well; needs refill.  Past Medical History  Diagnosis Date  . Dyslipidemia   . Diabetes mellitus 2005  . Smoker   . Hemorrhoids   . OAB (overactive bladder)   . Osteoporosis 07/2009  . GAD (generalized anxiety disorder)   . Diverticulosis   . Colon polyp     tubular adenoma (colonoscopy q5 yrs)  . Atrophic vaginitis     estrace cream rx'd by Dr. Vernie Ammons    Past Surgical History  Procedure Date  . Abdominal hysterectomy 1972    partial  . Pubovaginal sling 04/1998  . Tubal ligation   . Appendectomy   . Cardiovascular stress test 12/25/2004    EF 65%. OVERALL NORMAL STRESS CARDIOLITE. NO EVIDENCE  OF ISCHEMIA  . Colonoscopy 08/2010    History   Social History  . Marital Status: Divorced    Spouse Name: N/A    Number of Children: N/A  . Years of Education: N/A   Occupational History  . Not on file.   Social History Main Topics  . Smoking status: Current Everyday Smoker -- 0.5 packs/day  . Smokeless tobacco: Never Used  . Alcohol Use: No  . Drug Use: No  . Sexually Active: Not on file   Other Topics Concern  . Not on  file   Social History Narrative  . No narrative on file    Family History  Problem Relation Age of Onset  . Arthritis Mother   . Heart failure Mother   . Mental illness Father   . Kidney disease Father   . Stroke Father   . Heart attack Father   . Heart disease Brother   . Cancer Brother     prostate  . Stroke Maternal Aunt   . Cancer Maternal Uncle   . Stroke Paternal Grandmother    Current Outpatient Prescriptions on File Prior to Visit  Medication Sig Dispense Refill  . alendronate (FOSAMAX) 70 MG tablet Take 1 tablet (70 mg total) by mouth every 7 (seven) days. Take with a full glass of water on an empty stomach.  12 tablet  1  . citalopram (CELEXA) 20 MG tablet Take 1 tablet (20 mg total) by mouth daily.  90 tablet  1  . metFORMIN (GLUCOPHAGE) 500 MG tablet Take 1 tablet (500 mg total) by mouth daily.  90 tablet  1  . pravastatin (PRAVACHOL) 80 MG tablet Take 1 tablet (80 mg total) by mouth daily.  90 tablet  1   No Known Allergies  ROS: Denies fevers, nausea, vomiting, diarrhea, hematuria, skin rashes (has psoriasis). Denies chest pain, shortness of breath.  Occasional palpitation, possibly related to caffeine.  Denies headaches, dizziness, myalgias.  PHYSICAL EXAM: BP 118/76  Pulse 68  Ht 5\' 2"  (1.575 m)  Wt 172 lb (78.019 kg)  BMI 31.46 kg/m2 Well developed, pleasant overweight elderly female in no distress Neck: no lymphadenopathy, thyromegaly or carotid bruit Heart: regular rate and rhythm  Lungs: Scattered ronchi/wheeze, cleared with cough, good air movement Abdomen: soft, nontender, no mass Extremities: no edema, 2+ pulse.  No lesions, normal diabetic foot exam. Skin: no rashes Psych: somewhat irritable today.  Full range of affect  Lab Results  Component Value Date   HGBA1C 6.6* 02/14/2012   Lab Results  Component Value Date   CHOL 171 02/14/2012   HDL 44 02/14/2012   LDLCALC 93 02/14/2012   TRIG 169* 02/14/2012   CHOLHDL 3.9 02/14/2012     Chemistry        Component Value Date/Time   NA 138 10/22/2011 2125   K 3.7 10/22/2011 2125   CL 102 10/22/2011 2125   CO2 27 10/22/2011 2125   BUN 17 10/22/2011 2125   CREATININE 0.70 10/22/2011 2125   CREATININE 0.76 07/26/2011 0831      Component Value Date/Time   CALCIUM 9.9 10/22/2011 2125   ALKPHOS 97 02/14/2012 0837   AST 17 02/14/2012 0837   ALT 18 02/14/2012 0837   BILITOT 0.5 02/14/2012 0837     Urine dip: 1+ blood, 3+ leuks   ASSESSMENT/PLAN:  1. Type II or unspecified type diabetes mellitus without mention of complication, not stated as uncontrolled  metFORMIN (GLUCOPHAGE) 500 MG tablet  2. Burning with urination  POCT Urinalysis Dipstick  3. Pure hypercholesterolemia  pravastatin (PRAVACHOL) 80 MG tablet  4. Urinary tract infection, site not specified  sulfamethoxazole-trimethoprim (BACTRIM DS,SEPTRA DS) 800-160 MG per tablet   treat with Septra DS.  Pt didn't give enough urine specimen to send for culture--will try and return to provide specimen for sample.   5. Osteoporosis  alendronate (FOSAMAX) 70 MG tablet   DEXA UTD, continue meds  6. Anxiety state, unspecified  citalopram (CELEXA) 20 MG tablet   controlled  7. Tobacco use disorder     encouraged tobacco cessation, counseled regarding available options for help in quitting.  8. Need for Tdap vaccination  Tdap vaccine greater than or equal to 7yo IM   DM--due for diabetes eye exam. Controlled.  Continue current meds. Normal diabetic foot exam  UTI--Septra DS x 1 week.  Nurse visit to recheck urine dip if symptoms resolve; recommend OV if persistent symptoms.   F/u 6 months for med check, with fasting labs prior (orders entered)

## 2012-03-02 ENCOUNTER — Other Ambulatory Visit: Payer: Medicare Other

## 2012-03-02 DIAGNOSIS — N390 Urinary tract infection, site not specified: Secondary | ICD-10-CM

## 2012-03-04 LAB — CULTURE, URINE COMPREHENSIVE
Colony Count: NO GROWTH
Organism ID, Bacteria: NO GROWTH

## 2012-03-08 ENCOUNTER — Other Ambulatory Visit (INDEPENDENT_AMBULATORY_CARE_PROVIDER_SITE_OTHER): Payer: Medicare Other

## 2012-03-08 ENCOUNTER — Other Ambulatory Visit: Payer: Self-pay | Admitting: *Deleted

## 2012-03-08 DIAGNOSIS — N390 Urinary tract infection, site not specified: Secondary | ICD-10-CM

## 2012-03-08 LAB — POCT URINALYSIS DIPSTICK
Bilirubin, UA: NEGATIVE
Glucose, UA: NEGATIVE
Nitrite, UA: NEGATIVE
Urobilinogen, UA: NEGATIVE
pH, UA: 5

## 2012-03-10 LAB — CULTURE, URINE COMPREHENSIVE: Colony Count: NO GROWTH

## 2012-05-23 ENCOUNTER — Ambulatory Visit (INDEPENDENT_AMBULATORY_CARE_PROVIDER_SITE_OTHER): Payer: Medicare Other | Admitting: Medical

## 2012-05-23 ENCOUNTER — Encounter: Payer: Self-pay | Admitting: Medical

## 2012-05-23 VITALS — BP 110/60 | HR 80 | Temp 98.3°F | Resp 16 | Wt 171.0 lb

## 2012-05-23 DIAGNOSIS — R509 Fever, unspecified: Secondary | ICD-10-CM

## 2012-05-23 DIAGNOSIS — R5381 Other malaise: Secondary | ICD-10-CM

## 2012-05-23 LAB — POCT URINALYSIS DIPSTICK: Bilirubin, UA: NEGATIVE

## 2012-05-23 MED ORDER — CIPROFLOXACIN HCL 500 MG PO TABS: 500.0000 mg | ORAL_TABLET | Freq: Two times a day (BID) | ORAL | Status: AC

## 2012-05-23 NOTE — Progress Notes (Signed)
Subjective:   HPI  Marissa Oliver is a 71 y.o. female who presents with few day hx/o cold sweats, fever, runny nose, feeling bad since Friday 4 days ago.  Can't sleep.  Having some pain in left lower outer leg.  Using some Ibuprofen for headache and fever.  No sick contacts.  No other aggravating or relieving factors.  Been a while since last illness.  No known tick bites or rash.  Never checks her sugars, so unsure of recent sugar numbers.   No other c/o.  The following portions of the patient's history were reviewed and updated as appropriate: allergies, current medications, past family history, past medical history, past social history, past surgical history and problem list.  Past Medical History  Diagnosis Date  . Dyslipidemia   . Diabetes mellitus 2005  . Smoker   . Hemorrhoids   . OAB (overactive bladder)   . Osteoporosis 07/2009  . GAD (generalized anxiety disorder)   . Diverticulosis   . Colon polyp     tubular adenoma (colonoscopy q5 yrs)  . Atrophic vaginitis     estrace cream rx'd by Dr. Vernie Ammons    No Known Allergies   Review of Systems Gen: fever up to 101, +chills, sweats Heent: no cough, no ear pain, no ST Heart: no CP, palpitations Lungs: no SOB, dyspnea GI: no NVD, abdominal pain GU: negative Skin: no rash Neuro: no numbness or tingling, feels generally tired ROS reviewed and was negative other than noted in HPI or above.    Objective:   Physical Exam  General appearance: alert, no distress, WD/WN HEENT: normocephalic, sclerae anicteric, slight serous fluid behind TMs, nares patent, no discharge or erythema, pharynx normal Oral cavity: MMM, no lesions Neck: supple, no lymphadenopathy, no thyromegaly, no masses Heart: RRR, normal S1, S2, no murmurs Lungs: +cough with lung exam, few rhonchi scattered, but no no wheezes or rales Abdomen: +bs, soft, non tender, non distended, no masses, no hepatomegaly, no splenomegaly MSK: legs nontender, no obvious  deformity Pulses: 2+ symmetric, upper and lower extremities, normal cap refill  UA dipstick: Color, UA yellow Clarity, UA hazy Glucose, UA neg Bilirubin, UA neg Ketones, UA large Spec Grav, UA 1.015 Blood, UA large pH, UA 6.0 Protein, UA mod Urobilinogen, UA 1.0 Nitrite, UA neg Leukocytes, UA Trace  CXR: lung fields appear hyperinflated, but no mass, no pneumonia, no CHF or cardiomegaly.  No acute disease.  Will send CXR for radiology overread.    Glucose 139.  Assessment and Plan :     Encounter Diagnoses  Name Primary?  . Fever Yes  . Malaise    Her symptoms are vague.  She did have abnormal urinalysis and had UTI back in early part of this year.  We will send urine for culture and put her on Cipro empirically given the symptom and urine findings.    advised she rest, hydrate well, can use Ibuprofen or Aleve for fever/malaise, begin Cipro, and if not improving or worse in 2-3 days, return.

## 2012-05-26 ENCOUNTER — Telehealth: Payer: Self-pay | Admitting: Internal Medicine

## 2012-05-26 NOTE — Telephone Encounter (Signed)
Message copied by Joslyn Hy on Fri May 26, 2012  3:48 PM ------      Message from: Jac Canavan      Created: Fri May 26, 2012  3:42 PM       Call and see how she is feeling.  i saw her recently for fever.  Chest xray overread didn't show anything significant.  I she better?

## 2012-05-26 NOTE — Telephone Encounter (Signed)
Pt is coming in Tuesday to see Dr. Susann Givens. Pt is feeling good. But last couple days pt has felt bad and running low grade fever

## 2012-05-29 NOTE — Telephone Encounter (Signed)
May want to come in today if feeling worse

## 2012-05-29 NOTE — Telephone Encounter (Signed)
Patient was notified if feeling may need to come in today. Patient states that she is feeling better today so she will just come tomorrow. CLS

## 2012-05-30 ENCOUNTER — Encounter: Payer: Self-pay | Admitting: Family Medicine

## 2012-05-30 ENCOUNTER — Ambulatory Visit (INDEPENDENT_AMBULATORY_CARE_PROVIDER_SITE_OTHER): Payer: Medicare Other | Admitting: Family Medicine

## 2012-05-30 VITALS — BP 112/60 | HR 69 | Wt 170.0 lb

## 2012-05-30 DIAGNOSIS — R319 Hematuria, unspecified: Secondary | ICD-10-CM

## 2012-05-30 LAB — POCT URINALYSIS DIPSTICK
Bilirubin, UA: NEGATIVE
Nitrite, UA: NEGATIVE
Urobilinogen, UA: NEGATIVE
pH, UA: 5

## 2012-05-30 NOTE — Patient Instructions (Signed)
Call 800 Quit Now ,if you run into trouble with your smoking

## 2012-05-30 NOTE — Progress Notes (Signed)
  Subjective:    Patient ID: Marissa Oliver, female    DOB: 07/15/1941, 71 y.o.   MRN: [id number]  HPI She is here for recheck. She was seen approximately one week ago and treated with an antibiotic. The urine at that point did show red blood cells. The record indicates one other episode where she had blood in her urine. She's had no frequency or dysuria. In general she feels roughly 80% better. She also quit smoking approximately 8 days ago.   Review of Systems     Objective:   Physical Exam alert and in no distress. Tympanic membranes and canals are normal. Throat is clear. Tonsils are normal. Neck is supple without adenopathy or thyromegaly. Cardiac exam shows a regular sinus rhythm without murmurs or gallops. Lungs are clear to auscultation. Urine microscopic shows scattered red cells with epithelial cells.      Assessment & Plan:   1. Blood in urine  POCT Urinalysis Dipstick   I will do watchful waiting concerning the blood in her urine since the microscopic was negative. Encouraged her to continue with her smoking cessation. Did give her the 800 number to call. If he has further difficulty. Encouraged her to call me.

## 2012-07-27 ENCOUNTER — Encounter: Payer: Self-pay | Admitting: *Deleted

## 2012-08-14 ENCOUNTER — Other Ambulatory Visit: Payer: Medicare Other

## 2012-08-15 ENCOUNTER — Other Ambulatory Visit: Payer: Medicare Other

## 2012-08-15 DIAGNOSIS — Z23 Encounter for immunization: Secondary | ICD-10-CM

## 2012-08-15 MED ORDER — INFLUENZA VIRUS VACC SPLIT PF IM SUSP: 0.5000 mL | Freq: Once | INTRAMUSCULAR | Status: DC

## 2012-09-04 ENCOUNTER — Encounter: Payer: Medicare Other | Admitting: Family Medicine

## 2012-09-07 ENCOUNTER — Ambulatory Visit (INDEPENDENT_AMBULATORY_CARE_PROVIDER_SITE_OTHER): Payer: Medicare Other | Admitting: Medical

## 2012-09-07 ENCOUNTER — Encounter: Payer: Self-pay | Admitting: Medical

## 2012-09-07 VITALS — BP 140/70 | HR 64 | Temp 97.8°F | Resp 16 | Wt 169.0 lb

## 2012-09-07 DIAGNOSIS — E119 Type 2 diabetes mellitus without complications: Secondary | ICD-10-CM

## 2012-09-07 DIAGNOSIS — E7800 Pure hypercholesterolemia, unspecified: Secondary | ICD-10-CM

## 2012-09-07 DIAGNOSIS — R300 Dysuria: Secondary | ICD-10-CM

## 2012-09-07 LAB — POCT URINALYSIS DIPSTICK
Bilirubin, UA: NEGATIVE
Glucose, UA: NEGATIVE
Leukocytes, UA: NEGATIVE
Nitrite, UA: NEGATIVE
Urobilinogen, UA: NEGATIVE

## 2012-09-07 LAB — COMPREHENSIVE METABOLIC PANEL
Albumin: 4.1 g/dL (ref 3.5–5.2)
Alkaline Phosphatase: 84 U/L (ref 39–117)
Calcium: 9.5 mg/dL (ref 8.4–10.5)
Chloride: 102 mEq/L (ref 96–112)
Glucose, Bld: 92 mg/dL (ref 70–99)
Potassium: 3.9 mEq/L (ref 3.5–5.3)
Sodium: 138 mEq/L (ref 135–145)
Total Protein: 6.9 g/dL (ref 6.0–8.3)

## 2012-09-07 LAB — LIPID PANEL
Cholesterol: 166 mg/dL (ref 0–200)
Total CHOL/HDL Ratio: 3.3 Ratio

## 2012-09-07 LAB — HEMOGLOBIN A1C
Hgb A1c MFr Bld: 6.9 % — ABNORMAL HIGH (ref <5.7)
Mean Plasma Glucose: 151 mg/dL — ABNORMAL HIGH (ref <117)

## 2012-09-07 NOTE — Progress Notes (Signed)
Subjective: Here for c/o dysuria.   Burns with urination, has urgency, some frequency.  Has some vaginal dryness.  Urine was milky appearing the other day.  Trying to drink a lot water to clear this up.   Denies fever, back pain, but does have left flank pressure.  Denies prolapse but has had sling procedure in the past.  Sees urology, Dr. Ronal Fear.  Diabetes - not checking glucose.  Doesn't eat a lot of fast food.  Cooks mostly at home.  Cooks a lot of sweets lately.  Not really exercising.  Compliant with medication for cholesterol and blood pressure.   No new c/o. Fasting today, was suppose to come back soon for fasting labs, so she is here for this.   Been on fosamax about 2 years, no c/o.   Past Medical History  Diagnosis Date  . Dyslipidemia   . Diabetes mellitus 2005  . Smoker   . Hemorrhoids   . OAB (overactive bladder)   . Osteoporosis 07/2009  . GAD (generalized anxiety disorder)   . Diverticulosis   . Colon polyp     tubular adenoma (colonoscopy q5 yrs)  . Atrophic vaginitis     estrace cream rx'd by Dr. Vernie Ammons   ROS as in HPI    Objective:   Physical Exam  Filed Vitals:   09/07/12 1124  BP: 140/70  Pulse: 64  Temp: 97.8 F (36.6 C)  Resp: 16    General appearance: alert, no distress, WD/WN Skin: few small ecchymotic lesions of both forearms, chronic per pt HEENT: normocephalic, sclerae anicteric, TMs pearly, nares patent, no discharge or erythema, pharynx normal Oral cavity: MMM, no lesions, denture upper, edentulous lower Neck: supple, no lymphadenopathy, no thyromegaly, no masses Heart: RRR, normal S1, S2, no murmurs Lungs: CTA bilaterally, no wheezes, rhonchi, or rales Abdomen: +bs, soft, non tender, non distended, no masses, no hepatomegaly, no splenomegaly Pulses: 2+ symmetric, upper and lower extremities, normal cap refill Feet: 2nd left base of toenail with purplish eccchymosi from recently dropping plate on her toe, left volar foot with round 4mm  raised flesh color papular lesion, no sores or ulcers, no obvious callous    Assessment and Plan :    Encounter Diagnoses  Name Primary?  . Dysuria Yes  . Type II or unspecified type diabetes mellitus without mention of complication, not stated as uncontrolled   . Pure hypercholesterolemia    Dysuria - urinalysis not strongly suggestive of UTI.  She has had similar symptoms in recent past, hx/o bladder sling.  Will send urine for culture.   DM type II - labs today, c/t Metformin 500mg  daily.  Hyperlipidemia - labs today, c/t same medication.  Follow-up pending lab

## 2012-09-08 ENCOUNTER — Encounter: Payer: Medicare Other | Admitting: Family Medicine

## 2012-09-08 LAB — MICROALBUMIN / CREATININE URINE RATIO: Microalb, Ur: 0.77 mg/dL (ref 0.00–1.89)

## 2012-09-09 LAB — URINE CULTURE: Organism ID, Bacteria: NO GROWTH

## 2012-09-18 ENCOUNTER — Other Ambulatory Visit: Payer: Self-pay | Admitting: Family Medicine

## 2012-12-05 ENCOUNTER — Ambulatory Visit (INDEPENDENT_AMBULATORY_CARE_PROVIDER_SITE_OTHER): Payer: Medicare Other | Admitting: Medical

## 2012-12-05 ENCOUNTER — Encounter: Payer: Self-pay | Admitting: Medical

## 2012-12-05 VITALS — BP 140/70 | HR 69 | Temp 98.4°F | Resp 16 | Wt 187.0 lb

## 2012-12-05 DIAGNOSIS — R300 Dysuria: Secondary | ICD-10-CM

## 2012-12-05 DIAGNOSIS — R350 Frequency of micturition: Secondary | ICD-10-CM

## 2012-12-05 DIAGNOSIS — N3941 Urge incontinence: Secondary | ICD-10-CM

## 2012-12-05 DIAGNOSIS — N952 Postmenopausal atrophic vaginitis: Secondary | ICD-10-CM

## 2012-12-05 DIAGNOSIS — R3915 Urgency of urination: Secondary | ICD-10-CM

## 2012-12-05 LAB — POCT URINALYSIS DIPSTICK
Bilirubin, UA: NEGATIVE
Glucose, UA: NEGATIVE
Spec Grav, UA: <=1.005
Urobilinogen, UA: NEGATIVE

## 2012-12-05 MED ORDER — CIPROFLOXACIN HCL 500 MG PO TABS: 500.0000 mg | ORAL_TABLET | Freq: Two times a day (BID) | ORAL | Status: DC

## 2012-12-05 NOTE — Progress Notes (Signed)
Subjective:  Marissa Oliver is a 72 y.o. female who complains of abnormal smelling urine, burning with urination, dysuria, frequency, suprapubic pressure, urgency and burning with urination. She has had symptoms for 3 days. Patient also complains of hx/o urinary tract infection few times in the last several months.. Patient denies back pain, fever and vaginal discharge. Patient does not have a history of recurrent UTI. Patient does not have a history of pyelonephritis.  She has seen urology in the past for bladder symptoms, has been treated for atrophic vaginitis, but says the cream was too expensive so not using Estrace cream.does have mild incontinence with full bladder, having trouble getting to the bathroom fast enough   Past Medical History  Diagnosis Date  . Dyslipidemia   . Diabetes mellitus 2005  . Smoker   . Hemorrhoids   . OAB (overactive bladder)   . Osteoporosis 07/2009  . GAD (generalized anxiety disorder)   . Diverticulosis   . Colon polyp     tubular adenoma (colonoscopy q5 yrs)  . Atrophic vaginitis     estrace cream rx'd by Dr. Vernie Ammons   Past Surgical History  Procedure Date  . Abdominal hysterectomy 1972    partial  . Pubovaginal sling 04/1998  . Tubal ligation   . Appendectomy   . Cardiovascular stress test 12/25/2004    EF 65%. OVERALL NORMAL STRESS CARDIOLITE. NO EVIDENCE  OF ISCHEMIA  . Colonoscopy 08/2010   ROS as in subjective  Objective:    Filed Vitals:   12/05/12 1335  BP: 140/70  Pulse: 69  Temp: 98.4 F (36.9 C)  Resp: 16    General appearance: alert, no distress, WD/WN, female Oral cavity: MMM Abdomen: +bs, soft, non tender, non distended, no masses, no hepatomegaly, no splenomegaly, no bruits Back: no CVA tenderness Pulses: 2+ symmetric GU/Gyn - declines  Urinalysis:   Color, UA yellow Clarity, UA clear Glucose, UA neg Bilirubin, UA neg Ketones, UA neg Spec Grav, UA <=1.005 Blood, UA small pH, UA 6.0 Protein, UA neg Urobilinogen, UA  negative Nitrite, UA neg Leukocytes, UA Trace   Assessment and Plan:   Encounter Diagnoses  Name Primary?  . Dysuria Yes  . Urinary urgency   . Urinary frequency   . Atrophic vaginitis   . Urge incontinence    Discussed her concerns today, symptoms, urinalysis findings.  Will send for culture.  Will treat empirically for UTI.  Urine cultures in May, July and November negative.  Reviewed prior Urology notes which suggested Atrophic changes were a primary issue with these symptoms.   Advised if culture is negative, consider restarting treatment for atrophic vaginitis, recheck for pelvic exam, and consider further eval for incontinence.

## 2012-12-07 LAB — URINE CULTURE: Colony Count: >=100000

## 2013-03-13 ENCOUNTER — Telehealth: Payer: Self-pay | Admitting: Internal Medicine

## 2013-03-13 MED ORDER — PRAVASTATIN SODIUM 80 MG PO TABS: 80.0000 mg | ORAL_TABLET | Freq: Every day | ORAL | Status: DC

## 2013-03-13 MED ORDER — METFORMIN HCL 500 MG PO TABS: 500.0000 mg | ORAL_TABLET | Freq: Every day | ORAL | Status: DC

## 2013-03-13 NOTE — Telephone Encounter (Signed)
Pt states that she is out of her meds and needs about a 2 week supply of them sent to [facility] on [address]. She has moved her appt to next week

## 2013-03-14 ENCOUNTER — Encounter: Payer: Medicare Other | Admitting: Medical

## 2013-03-21 ENCOUNTER — Ambulatory Visit (INDEPENDENT_AMBULATORY_CARE_PROVIDER_SITE_OTHER): Payer: Medicare Other | Admitting: Medical

## 2013-03-21 ENCOUNTER — Encounter: Payer: Self-pay | Admitting: Medical

## 2013-03-21 VITALS — BP 140/70 | HR 74 | Temp 97.7°F | Resp 16 | Wt 174.0 lb

## 2013-03-21 DIAGNOSIS — E119 Type 2 diabetes mellitus without complications: Secondary | ICD-10-CM

## 2013-03-21 DIAGNOSIS — M949 Disorder of cartilage, unspecified: Secondary | ICD-10-CM

## 2013-03-21 DIAGNOSIS — IMO0001 Reserved for inherently not codable concepts without codable children: Secondary | ICD-10-CM

## 2013-03-21 DIAGNOSIS — E785 Hyperlipidemia, unspecified: Secondary | ICD-10-CM

## 2013-03-21 DIAGNOSIS — F17200 Nicotine dependence, unspecified, uncomplicated: Secondary | ICD-10-CM

## 2013-03-21 DIAGNOSIS — R030 Elevated blood-pressure reading, without diagnosis of hypertension: Secondary | ICD-10-CM

## 2013-03-21 DIAGNOSIS — Z23 Encounter for immunization: Secondary | ICD-10-CM

## 2013-03-21 DIAGNOSIS — M899 Disorder of bone, unspecified: Secondary | ICD-10-CM

## 2013-03-21 DIAGNOSIS — M8580 Other specified disorders of bone density and structure, unspecified site: Secondary | ICD-10-CM

## 2013-03-21 DIAGNOSIS — E669 Obesity, unspecified: Secondary | ICD-10-CM

## 2013-03-21 LAB — HEPATIC FUNCTION PANEL
ALT: 22 U/L (ref 0–35)
AST: 19 U/L (ref 0–37)
Albumin: 4.7 g/dL (ref 3.5–5.2)

## 2013-03-21 LAB — LIPID PANEL
Cholesterol: 179 mg/dL (ref 0–200)
HDL: 43 mg/dL (ref >39)
Total CHOL/HDL Ratio: 4.2 Ratio

## 2013-03-21 NOTE — Progress Notes (Signed)
Subjective: Here for med check, routine f/u on diabetes, cholesterol, medications.   Not checking glucose.  Doesn't eat a lot of fast food.  Cooks mostly at home.  Cooks a lot of sweets lately.  Not really exercising.  Compliant with medication for cholesterol.   No new c/o. Fasting today.   Been on fosamax about 2 years, no c/o.  No new issues.    Past Medical History  Diagnosis Date  . Dyslipidemia   . Diabetes mellitus 2005  . Smoker   . Hemorrhoids   . OAB (overactive bladder)   . Osteoporosis 07/2009  . GAD (generalized anxiety disorder)   . Diverticulosis   . Colon polyp     tubular adenoma (colonoscopy q5 yrs)  . Atrophic vaginitis     estrace cream rx'd by Dr. Vernie Ammons   ROS as in HPI    Objective:   Physical Exam  Filed Vitals:   03/21/13 1426  BP: 140/70  Pulse: 74  Temp: 97.7 F (36.5 C)  Resp: 16    General appearance: alert, no distress, WD/WN Oral cavity: MMM, no lesions, denture upper, edentulous lower Neck: supple, no lymphadenopathy, no thyromegaly, no masses Heart: RRR, normal S1, S2, no murmurs Lungs: CTA bilaterally, no wheezes, rhonchi, or rales Abdomen: +bs, soft, non tender, non distended, no masses, no hepatomegaly, no splenomegaly Pulses: 2+ symmetric, upper and lower extremities, normal cap refill   Assessment and Plan :    Encounter Diagnoses  Name Primary?  . Type II or unspecified type diabetes mellitus without mention of complication, not stated as uncontrolled Yes  . Hyperlipidemia   . Elevated blood pressure   . Obesity, unspecified   . Osteopenia   . Tobacco use disorder   . Need for shingles vaccine    advised she c/t current medications, advised 10 lb weight loss through healthy diet, exercise.  Advised she start walking regularly for exercise, cut out the sweets which seems to be her biggest current issue.   Advised she begin Lisinopril for elevated BP and renal protection, but she wants to try losing some weight and eating  better first.  reviewed last bone density scan from 11/2011, osteopenia.  C/t Fosamax, advised OTC Vit D daily.  Advised smoking cessation.   Advised she check insurer about shingles vaccine, counseled on the vaccine.

## 2013-03-22 LAB — HEMOGLOBIN A1C: Mean Plasma Glucose: 143 mg/dL — ABNORMAL HIGH (ref <117)

## 2013-03-22 LAB — VITAMIN D 25 HYDROXY (VIT D DEFICIENCY, FRACTURES): Vit D, 25-Hydroxy: 16 ng/mL — ABNORMAL LOW (ref 30–89)

## 2013-03-28 ENCOUNTER — Other Ambulatory Visit: Payer: Self-pay | Admitting: Medical

## 2013-03-28 ENCOUNTER — Other Ambulatory Visit: Payer: Self-pay | Admitting: Family Medicine

## 2013-03-28 ENCOUNTER — Telehealth: Payer: Self-pay | Admitting: Family Medicine

## 2013-03-28 MED ORDER — METFORMIN HCL 500 MG PO TABS: 500.0000 mg | ORAL_TABLET | Freq: Every day | ORAL | Status: DC

## 2013-03-28 MED ORDER — CITALOPRAM HYDROBROMIDE 20 MG PO TABS: 20.0000 mg | ORAL_TABLET | Freq: Every day | ORAL | Status: DC

## 2013-03-28 MED ORDER — EZETIMIBE-SIMVASTATIN 10-20 MG PO TABS: 1.0000 | ORAL_TABLET | Freq: Every day | ORAL | Status: DC

## 2013-03-28 MED ORDER — ALENDRONATE SODIUM 70 MG PO TABS: 70.0000 mg | ORAL_TABLET | ORAL | Status: DC

## 2013-03-28 NOTE — Telephone Encounter (Signed)
PT called for lab results and also needs refill on all 4 of her meds to mail order that we have on file. Pt   Ph [phone]

## 2013-03-28 NOTE — Telephone Encounter (Signed)
See this and other messages

## 2013-03-28 NOTE — Telephone Encounter (Signed)
Sent medications to the pharmacy. CLS

## 2013-03-29 ENCOUNTER — Telehealth: Payer: Self-pay | Admitting: Family Medicine

## 2013-03-29 ENCOUNTER — Other Ambulatory Visit: Payer: Self-pay | Admitting: Family Medicine

## 2013-03-29 MED ORDER — PRAVASTATIN SODIUM 80 MG PO TABS: 80.0000 mg | ORAL_TABLET | Freq: Every day | ORAL | Status: DC

## 2013-03-29 MED ORDER — ATORVASTATIN CALCIUM 40 MG PO TABS: 40.0000 mg | ORAL_TABLET | Freq: Every day | ORAL | Status: DC

## 2013-03-29 MED ORDER — METFORMIN HCL 500 MG PO TABS: 500.0000 mg | ORAL_TABLET | Freq: Every day | ORAL | Status: DC

## 2013-03-29 NOTE — Telephone Encounter (Signed)
Pt called and said we call in Vytorin and it has a $75.00 copay and she cannot afford this.  Pt states she told Karren Burly yesterday that the Simvisating 40 mg and the Atorvastating 40 mg with 90 day  Supply that she could get for free and that is what she would like to try.  She is going out of town and it waiting a phone call. [phone].  Please advise.

## 2013-03-29 NOTE — Telephone Encounter (Signed)
I spoke with Marissa Oliver.  OK to call in Atorvastatin 40 mg for 90 day mail order as pt gets it free.  Pt aware.  Pt is out of her Metformin and out of her Prevastatin I will call in 2 week supply until she receives her mail order.  Offered her Crestor 10 mg samples and she declined.

## 2013-03-30 ENCOUNTER — Telehealth: Payer: Self-pay | Admitting: Family Medicine

## 2013-03-30 ENCOUNTER — Other Ambulatory Visit: Payer: Self-pay | Admitting: Medical

## 2013-03-30 MED ORDER — METFORMIN HCL 500 MG PO TABS: 500.0000 mg | ORAL_TABLET | Freq: Every day | ORAL | Status: DC

## 2013-03-30 MED ORDER — CITALOPRAM HYDROBROMIDE 20 MG PO TABS: 20.0000 mg | ORAL_TABLET | Freq: Every day | ORAL | Status: DC

## 2013-03-30 MED ORDER — ERGOCALCIFEROL 1.25 MG (50000 UT) PO CAPS: 50000.0000 [IU] | ORAL_CAPSULE | ORAL | Status: DC

## 2013-03-30 MED ORDER — ALENDRONATE SODIUM 70 MG PO TABS: 70.0000 mg | ORAL_TABLET | ORAL | Status: DC

## 2013-03-30 MED ORDER — ATORVASTATIN CALCIUM 40 MG PO TABS: 40.0000 mg | ORAL_TABLET | Freq: Every day | ORAL | Status: DC

## 2013-03-30 NOTE — Telephone Encounter (Signed)
Patient called and left a message for me to call her back. I called and she was not at home. CLS

## 2013-04-02 ENCOUNTER — Telehealth: Payer: Self-pay | Admitting: Internal Medicine

## 2013-04-02 MED ORDER — ERGOCALCIFEROL 1.25 MG (50000 UT) PO CAPS: 50000.0000 [IU] | ORAL_CAPSULE | ORAL | Status: DC

## 2013-04-02 NOTE — Telephone Encounter (Signed)
Mail order will not provide her VIT D 50,000

## 2013-04-02 NOTE — Telephone Encounter (Signed)
All refills was sent out to [facility]. CLS

## 2013-04-10 ENCOUNTER — Telehealth: Payer: Self-pay | Admitting: Internal Medicine

## 2013-04-10 MED ORDER — ALENDRONATE SODIUM 70 MG PO TABS: 70.0000 mg | ORAL_TABLET | ORAL | Status: DC

## 2013-04-10 MED ORDER — METFORMIN HCL 500 MG PO TABS: 500.0000 mg | ORAL_TABLET | Freq: Every day | ORAL | Status: DC

## 2013-04-10 NOTE — Telephone Encounter (Signed)
Pt needed a 90 day refill for metformin 500mg ,  Fosamax 70mg 

## 2013-04-12 ENCOUNTER — Other Ambulatory Visit: Payer: Self-pay | Admitting: Medical

## 2013-04-12 NOTE — Telephone Encounter (Signed)
Is this ok?

## 2013-08-28 ENCOUNTER — Ambulatory Visit (INDEPENDENT_AMBULATORY_CARE_PROVIDER_SITE_OTHER): Payer: Medicare Other | Admitting: Medical

## 2013-08-28 ENCOUNTER — Encounter: Payer: Self-pay | Admitting: Medical

## 2013-08-28 VITALS — BP 110/60 | HR 80 | Temp 97.7°F | Resp 16 | Wt 172.0 lb

## 2013-08-28 DIAGNOSIS — Z23 Encounter for immunization: Secondary | ICD-10-CM

## 2013-08-28 DIAGNOSIS — F17200 Nicotine dependence, unspecified, uncomplicated: Secondary | ICD-10-CM

## 2013-08-28 DIAGNOSIS — E559 Vitamin D deficiency, unspecified: Secondary | ICD-10-CM | POA: Insufficient documentation

## 2013-08-28 DIAGNOSIS — E119 Type 2 diabetes mellitus without complications: Secondary | ICD-10-CM

## 2013-08-28 DIAGNOSIS — R300 Dysuria: Secondary | ICD-10-CM

## 2013-08-28 DIAGNOSIS — E785 Hyperlipidemia, unspecified: Secondary | ICD-10-CM | POA: Insufficient documentation

## 2013-08-28 LAB — POCT GLYCOSYLATED HEMOGLOBIN (HGB A1C): Hemoglobin A1C: 7.1

## 2013-08-28 LAB — POCT URINALYSIS DIPSTICK
Bilirubin, UA: NEGATIVE
Glucose, UA: NEGATIVE
Nitrite, UA: NEGATIVE
Urobilinogen, UA: NEGATIVE

## 2013-08-28 MED ORDER — METFORMIN HCL 500 MG PO TABS: 500.0000 mg | ORAL_TABLET | Freq: Two times a day (BID) | ORAL | Status: DC

## 2013-08-28 MED ORDER — CITALOPRAM HYDROBROMIDE 20 MG PO TABS: 20.0000 mg | ORAL_TABLET | Freq: Every day | ORAL | Status: DC

## 2013-08-28 MED ORDER — ALENDRONATE SODIUM 70 MG PO TABS: 70.0000 mg | ORAL_TABLET | ORAL | Status: DC

## 2013-08-28 NOTE — Progress Notes (Signed)
Subjective:    Marissa Oliver is a 72 y.o. female who presents for chronic disease f/u on Type 2 diabetes mellitus, dyslipidemia, Vit D deficiency.  Diabetes: Home blood sugar records: she doesn'r check her sugars, doesn't have a glucometer Current symptoms/problems include none and have been unchanged Daily foot checks: not checking,  Any foot concerns: no Last eye exam: has visit scheduled December Has spent last 10 weeks caring for aunt in [place].   Medication compliance: Current diet: in general, a "healthy" diet  , some good and some bad, drinks 1 soda daily, sometimes eats candy.  Weakness is potatoes.  Eats stewed potatoes with butter. Current exercise: none Known diabetic complications:none Cardiovascular risk factors: advanced age (older than 40 for men, 28 for women), diabetes mellitus and dyslipidemia   Vit D deficiency - taking Vit D  Dyslipidemia - last visit I advised change to vytorin but she couldn't afford, so instead is using Lipitor.  Smokes 1 pack every 3 days.  No alcohol.    Been having recent burning with urination, drinking more water, has pressure over left kidney.     The following portions of the patient's history were reviewed and updated as appropriate: allergies, current medications, past family history, past medical history, past social history, past surgical history and problem list.  ROS as in subjective above    Objective:    BP 110/60  Pulse 80  Temp(Src) 97.7 F (36.5 C) (Oral)  Resp 16  Wt 172 lb (78.019 kg)  BMI 31.45 kg/m2  General appearance: alert, no distress, WD/WN Neck: supple, no lymphadenopathy, no thyromegaly, no masses Heart: RRR, normal S1, S2, no murmurs Lungs: CTA bilaterally, no wheezes, rhonchi, or rales Abdomen: +bs, soft, non tender, non distended, no masses, no hepatomegaly, no splenomegaly Pulses: 2+ symmetric, upper and lower extremities, normal cap refill Ext: no edema   Assessment:   Encounter Diagnoses   Name Primary?  . Type II or unspecified type diabetes mellitus without mention of complication, not stated as uncontrolled Yes  . Dyslipidemia   . Unspecified vitamin D deficiency   . Need for prophylactic vaccination and inoculation against influenza   . Tobacco use disorder   . Dysuria      Plan:   Diabetes Mellitus type 2: Education: Reviewed 'ABCs' of diabetes management (respective goals in parentheses):  A1C (<7), blood pressure (<130/80), and cholesterol (LDL <100)   Diabetes mellitus Type II, under satisfactory control.   Increase Metformin to 500mg  BID.   She doesn't seem to compelled to change her diet, and she likes her potatoes and bread.  Compliance at present is estimated to be fair. Efforts to improve compliance (if necessary) will be directed at dietary modifications: diabetic diet, increased exercise and regular blood sugar monitoring: daily.    Blood pressure: normal blood pressure .   An ACE/ARB is not currently part of their treatment regimen.   Dyslipidemia under fair control. .  A statin is currently part of their treatment regimen.   Discussed general issues about diabetes pathophysiology and management. Counseling at today's visit: focused on the need to adhere to the prescribed ADA diet. Addressed ADA diet. Suggested low cholesterol diet. Encouraged aerobic exercise. Discussed foot care. Reminded to get yearly retinal exam. Discussed sick day management.    Vit D deficiency - pending labs, compliant with prescription Vit D  Tobacco use - advised she stop tobacco  Counseled on the influenza virus vaccine.  Vaccine information sheet given.  Influenza vaccine given  after consent obtained.  Dysuria - culture sent  Follow up: 28mo

## 2013-08-29 ENCOUNTER — Other Ambulatory Visit: Payer: Self-pay | Admitting: Medical

## 2013-08-29 LAB — LIPID PANEL
Cholesterol: 132 mg/dL (ref 0–200)
HDL: 47 mg/dL (ref >39)
Total CHOL/HDL Ratio: 2.8 Ratio
VLDL: 23 mg/dL (ref 0–40)

## 2013-08-29 LAB — COMPREHENSIVE METABOLIC PANEL
ALT: 18 U/L (ref 0–35)
Alkaline Phosphatase: 90 U/L (ref 39–117)
CO2: 24 mEq/L (ref 19–32)
Creat: 0.73 mg/dL (ref 0.50–1.10)
Glucose, Bld: 141 mg/dL — ABNORMAL HIGH (ref 70–99)
Potassium: 4.1 mEq/L (ref 3.5–5.3)
Sodium: 134 mEq/L — ABNORMAL LOW (ref 135–145)
Total Bilirubin: 0.5 mg/dL (ref 0.3–1.2)

## 2013-08-29 LAB — VITAMIN D 25 HYDROXY (VIT D DEFICIENCY, FRACTURES): Vit D, 25-Hydroxy: 40 ng/mL (ref 30–89)

## 2013-08-29 LAB — MICROALBUMIN / CREATININE URINE RATIO: Microalb, Ur: 0.6 mg/dL (ref 0.00–1.89)

## 2013-08-29 MED ORDER — ATORVASTATIN CALCIUM 40 MG PO TABS: 40.0000 mg | ORAL_TABLET | Freq: Every day | ORAL | Status: DC

## 2013-08-30 ENCOUNTER — Other Ambulatory Visit: Payer: Self-pay | Admitting: Medical

## 2013-08-30 ENCOUNTER — Other Ambulatory Visit: Payer: Self-pay | Admitting: *Deleted

## 2013-08-30 LAB — URINE CULTURE: Colony Count: >=100000

## 2013-08-30 MED ORDER — CIPROFLOXACIN HCL 500 MG PO TABS: 500.0000 mg | ORAL_TABLET | Freq: Two times a day (BID) | ORAL | Status: DC

## 2013-10-02 ENCOUNTER — Encounter: Payer: Self-pay | Admitting: Internal Medicine

## 2013-11-16 ENCOUNTER — Telehealth: Payer: Self-pay | Admitting: Medical

## 2013-11-16 NOTE — Telephone Encounter (Signed)
I fax the form over to [facility]. CLS

## 2013-11-19 ENCOUNTER — Emergency Department (HOSPITAL_COMMUNITY)
Admission: EM | Admit: 2013-11-19 | Discharge: 2013-11-19 | Disposition: A | Discharge status: Home / Self Care | Payer: Medicare HMO | Attending: Emergency Medicine | Admitting: Emergency Medicine

## 2013-11-19 ENCOUNTER — Encounter (HOSPITAL_COMMUNITY): Payer: Self-pay | Admitting: Emergency Medicine

## 2013-11-19 ENCOUNTER — Emergency Department (HOSPITAL_COMMUNITY): Payer: Medicare HMO

## 2013-11-19 DIAGNOSIS — Z87448 Personal history of other diseases of urinary system: Secondary | ICD-10-CM | POA: Insufficient documentation

## 2013-11-19 DIAGNOSIS — Z860100 Personal history of colon polyps, unspecified: Secondary | ICD-10-CM | POA: Insufficient documentation

## 2013-11-19 DIAGNOSIS — M810 Age-related osteoporosis without current pathological fracture: Secondary | ICD-10-CM | POA: Insufficient documentation

## 2013-11-19 DIAGNOSIS — Z8679 Personal history of other diseases of the circulatory system: Secondary | ICD-10-CM | POA: Insufficient documentation

## 2013-11-19 DIAGNOSIS — E119 Type 2 diabetes mellitus without complications: Secondary | ICD-10-CM | POA: Insufficient documentation

## 2013-11-19 DIAGNOSIS — E785 Hyperlipidemia, unspecified: Secondary | ICD-10-CM | POA: Insufficient documentation

## 2013-11-19 DIAGNOSIS — Z8742 Personal history of other diseases of the female genital tract: Secondary | ICD-10-CM | POA: Insufficient documentation

## 2013-11-19 DIAGNOSIS — F411 Generalized anxiety disorder: Secondary | ICD-10-CM | POA: Insufficient documentation

## 2013-11-19 DIAGNOSIS — M25559 Pain in unspecified hip: Secondary | ICD-10-CM | POA: Insufficient documentation

## 2013-11-19 DIAGNOSIS — Z87891 Personal history of nicotine dependence: Secondary | ICD-10-CM | POA: Insufficient documentation

## 2013-11-19 DIAGNOSIS — Z79899 Other long term (current) drug therapy: Secondary | ICD-10-CM | POA: Insufficient documentation

## 2013-11-19 LAB — HM DEXA SCAN

## 2013-11-19 MED ORDER — HYDROMORPHONE HCL PF 1 MG/ML IJ SOLN
0.5000 mg | Freq: Once | INTRAMUSCULAR | Status: AC
  Administered 2013-11-19: 0.5 mg via INTRAMUSCULAR
  Filled 2013-11-19: qty 1

## 2013-11-19 MED ORDER — OXYCODONE-ACETAMINOPHEN 5-325 MG PO TABS: 1.0000 | ORAL_TABLET | Freq: Four times a day (QID) | ORAL | Status: DC | PRN

## 2013-11-19 MED ORDER — OXYCODONE-ACETAMINOPHEN 5-325 MG PO TABS
1.0000 | ORAL_TABLET | Freq: Once | ORAL | Status: AC
  Administered 2013-11-19: 1 via ORAL
  Filled 2013-11-19: qty 1

## 2013-11-19 NOTE — ED Notes (Signed)
Bed: [facility] Expected date: 11/19/13 Expected time: 8:23 PM Means of arrival: Ambulance Comments: Hip pain

## 2013-11-19 NOTE — Discharge Instructions (Signed)
Hip Pain  The hips join the upper legs to the lower pelvis. The bones, cartilage, tendons, and muscles of the hip joint perform a lot of work each day holding your body weight and allowing you to move around.  Hip pain is a common symptom. It can range from a minor ache to severe pain on 1 or both hips. Pain may be felt on the inside of the hip joint near the groin, or the outside near the buttocks and upper thigh. There may be swelling or stiffness as well. It occurs more often when a person walks or performs activity. There are many reasons hip pain can develop.  CAUSES   It is important to work with your caregiver to identify the cause since many conditions can impact the bones, cartilage, muscles, and tendons of the hips. Causes for hip pain include:   Broken (fractured) bones.   Separation of the thighbone from the hip socket (dislocation).   Torn cartilage of the hip joint.   Swelling (inflammation) of a tendon (tendonitis), the sac within the hip joint (bursitis), or a joint.   A weakening in the abdominal wall (hernia), affecting the nerves to the hip.   Arthritis in the hip joint or lining of the hip joint.   Pinched nerves in the back, hip, or upper thigh.   A bulging disc in the spine (herniated disc).   Rarely, bone infection or cancer.  DIAGNOSIS   The location of your hip pain will help your caregiver understand what may be causing the pain. A diagnosis is based on your medical history, your symptoms, results from your physical exam, and results from diagnostic tests. Diagnostic tests may include X-ray exams, a computerized magnetic scan (magnetic resonance imaging, MRI), or bone scan.  TREATMENT   Treatment will depend on the cause of your hip pain. Treatment may include:   Limiting activities and resting until symptoms improve.   Crutches or other walking supports (a cane or brace).   Ice, elevation, and compression.   Physical therapy or home exercises.   Shoe inserts or special  shoes.   Losing weight.   Medications to reduce pain.   Undergoing surgery.  HOME CARE INSTRUCTIONS    Only take over-the-counter or prescription medicines for pain, discomfort, or fever as directed by your caregiver.   Put ice on the injured area:   Put ice in a plastic bag.   Place a towel between your skin and the bag.   Leave the ice on for 15-20 minutes at a time, 03-04 times a day.   Keep your leg raised (elevated) when possible to lessen swelling.   Avoid activities that cause pain.   Follow specific exercises as directed by your caregiver.   Sleep with a pillow between your legs on your most comfortable side.   Record how often you have hip pain, the location of the pain, and what it feels like. This information may be helpful to you and your caregiver.   Ask your caregiver about returning to work or sports and whether you should drive.   Follow up with your caregiver for further exams, therapy, or testing as directed.  SEEK MEDICAL CARE IF:    Your pain or swelling continues or worsens after 1 week.   You are feeling unwell or have chills.   You have increasing difficulty with walking.   You have a loss of sensation or other new symptoms.   You have questions or concerns.  SEEK   IMMEDIATE MEDICAL CARE IF:    You cannot put weight on the affected hip.   You have fallen.   You have a sudden increase in pain and swelling in your hip.   You have a fever.  MAKE SURE YOU:    Understand these instructions.   Will watch your condition.   Will get help right away if you are not doing well or get worse.  Document Released: 04/07/2010 Document Revised: 01/10/2012 Document Reviewed: 04/07/2010  [employer] Patient Information 2014 [employer].

## 2013-11-19 NOTE — ED Notes (Signed)
Pt arrived to the ED with a complaint of non traumatic hip pain.  Pt states that the pain has been going on for about a week.  Per EMS pt was able to walk from the bedroom to the living room to get onto the EMS stretcher.  Pt rates her pain 10/10.  Pt states pain is on the right side in the lower pelvic area radiating to the right flank area.  Pt states she has had no problems urinating

## 2013-11-19 NOTE — ED Notes (Signed)
Pt is able to move her leg in a full range of motion.  Pt is doing so in an effort to relieve her pain.

## 2013-11-19 NOTE — ED Provider Notes (Signed)
CSN: [id number]     Arrival date & time 11/19/13  2033 History   First MD Initiated Contact with Patient 11/19/13 2052     Chief Complaint  Patient presents with  . Hip Pain   (Consider location/radiation/quality/duration/timing/severity/associated sxs/prior Treatment) Patient is a 73 y.o. female presenting with hip pain. The history is provided by the patient.  Hip Pain This is a new problem. Pertinent negatives include no chest pain, no abdominal pain, no headaches and no shortness of breath.   patient has had pain in her right hip area for over a week. It is worse with movement and worse with certain positions. She states she had a bone density test today and if her to have her hip out in that position. No numbness weakness. No diarrhea constipation. No dysuria. No nausea or vomiting. No rash. No trauma Past Medical History  Diagnosis Date  . Dyslipidemia   . Diabetes mellitus 2005  . Smoker   . Hemorrhoids   . OAB (overactive bladder)   . Osteoporosis 07/2009  . GAD (generalized anxiety disorder)   . Diverticulosis   . Colon polyp     tubular adenoma (colonoscopy q5 yrs)  . Atrophic vaginitis     estrace cream rx'd by Dr. Karsten Ro   Past Surgical History  Procedure Laterality Date  . Abdominal hysterectomy  1972    partial  . Pubovaginal sling  04/1998  . Tubal ligation    . Appendectomy    . Cardiovascular stress test  12/25/2004    EF 65%. OVERALL NORMAL STRESS CARDIOLITE. NO EVIDENCE  OF ISCHEMIA  . Colonoscopy  08/2010   Family History  Problem Relation Age of Onset  . Arthritis Mother   . Heart failure Mother   . Mental illness Father   . Kidney disease Father   . Stroke Father   . Heart attack Father   . Heart disease Brother   . Cancer Brother     prostate  . Stroke Maternal Aunt   . Cancer Maternal Uncle   . Stroke Paternal Grandmother    History  Substance Use Topics  . Smoking status: Former Smoker -- 0.50 packs/day    Quit date: 05/22/2012  .  Smokeless tobacco: Never Used  . Alcohol Use: No   OB History   Grav Para Term Preterm Abortions TAB SAB Ect Mult Living                 Review of Systems  Constitutional: Negative for activity change and appetite change.  Eyes: Negative for pain.  Respiratory: Negative for chest tightness and shortness of breath.   Cardiovascular: Negative for chest pain and leg swelling.  Gastrointestinal: Negative for nausea, vomiting, abdominal pain and diarrhea.  Genitourinary: Negative for flank pain.  Musculoskeletal: Negative for back pain and neck stiffness.  Skin: Negative for rash.  Neurological: Negative for weakness, numbness and headaches.  Psychiatric/Behavioral: Negative for behavioral problems.    Allergies  Review of patient's allergies indicates no known allergies.  Home Medications   Current Outpatient Rx  Name  Route  Sig  Dispense  Refill  . alendronate (FOSAMAX) 70 MG tablet   Oral   Take 70 mg by mouth every 7 (seven) days. Sunday Take with a full glass of water on an empty stomach.         . citalopram (CELEXA) 20 MG tablet   Oral   Take 1 tablet (20 mg total) by mouth daily.   90 tablet  1   . ergocalciferol (VITAMIN D2) 50000 UNITS capsule   Oral   Take 1 capsule (50,000 Units total) by mouth once a week.   12 capsule   3     90 day supply   . metFORMIN (GLUCOPHAGE) 500 MG tablet   Oral   Take 1 tablet (500 mg total) by mouth 2 (two) times daily with a meal.   180 tablet   1   . pravastatin (PRAVACHOL) 20 MG tablet   Oral   Take 20 mg by mouth daily.         . traMADol (ULTRAM) 50 MG tablet   Oral   Take by mouth every 6 (six) hours as needed (pain).         Marland Kitchen oxyCODONE-acetaminophen (PERCOCET/ROXICET) 5-325 MG per tablet   Oral   Take 1-2 tablets by mouth every 6 (six) hours as needed for severe pain.   10 tablet   0    BP 181/71  Pulse 65  Temp(Src) 97.4 F (36.3 C) (Oral)  Resp 18  SpO2 95% Physical Exam  Nursing note and  vitals reviewed. Constitutional: She is oriented to person, place, and time. She appears well-developed and well-nourished.  HENT:  Head: Normocephalic and atraumatic.  Eyes: EOM are normal. Pupils are equal, round, and reactive to light.  Neck: Normal range of motion. Neck supple.  Cardiovascular: Normal rate, regular rhythm and normal heart sounds.   No murmur heard. Pulmonary/Chest: Effort normal and breath sounds normal. No respiratory distress. She has no wheezes. She has no rales.  Abdominal: Soft. Bowel sounds are normal. She exhibits no distension. There is no tenderness. There is no rebound and no guarding.  Musculoskeletal: Normal range of motion.  Some pain to right hip with some flexion and external rotation. No rash. Range of motion intact. No CVA tenderness. No abdominal pain.  Neurological: She is alert and oriented to person, place, and time. No cranial nerve deficit.  Skin: Skin is warm and dry.  Psychiatric: She has a normal mood and affect. Her speech is normal.    ED Course  Procedures (including critical care time) Labs Review Labs Reviewed - No data to display Imaging Review Dg Hip Complete Right  11/19/2013   CLINICAL DATA:  Intermittent right hip pain for 1 week, no known injury  EXAM: RIGHT HIP - COMPLETE 2+ VIEW  COMPARISON:  None.  FINDINGS: No fracture or dislocation. Mild degenerative change of the right hip with joint space loss, subchondral sclerosis and osteophytosis. No definite evidence of avascular necrosis.  Similar mild appearing degenerative change is suspected within the contralateral left hip the likely less severe.  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  Multiple phleboliths overlie the lower pelvis. Moderate colonic stool burden. Regional soft tissues appear normal.  IMPRESSION: 1. No acute findings. 2. Mild degenerate change of the right hip. 3. 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   Electronically Signed   By: Sandi Mariscal M.D.   On: 11/19/2013 21:42    EKG Interpretation   None       MDM   1. Hip pain    Patient with right hip pain. Worse with movement. No rash. X-ray showed just some degenerative changes. No abdominal tenderness. No masses. No dysuria. Will discharge home with pain medicines. If develops dysuria or fever may need more urgent followup    Jasper Riling. Alvino Chapel, MD 11/19/13  2339 

## 2013-11-21 ENCOUNTER — Encounter: Payer: Self-pay | Admitting: Internal Medicine

## 2014-01-29 ENCOUNTER — Telehealth: Payer: Self-pay | Admitting: Family Medicine

## 2014-01-29 ENCOUNTER — Encounter: Payer: Self-pay | Admitting: Medical

## 2014-01-29 ENCOUNTER — Ambulatory Visit (INDEPENDENT_AMBULATORY_CARE_PROVIDER_SITE_OTHER): Payer: Medicare HMO | Admitting: Medical

## 2014-01-29 ENCOUNTER — Other Ambulatory Visit: Payer: Self-pay | Admitting: Medical

## 2014-01-29 VITALS — BP 140/70 | HR 72 | Temp 97.7°F | Resp 14 | Ht 61.0 in | Wt 173.0 lb

## 2014-01-29 DIAGNOSIS — Z01411 Encounter for gynecological examination (general) (routine) with abnormal findings: Secondary | ICD-10-CM

## 2014-01-29 DIAGNOSIS — E785 Hyperlipidemia, unspecified: Secondary | ICD-10-CM

## 2014-01-29 DIAGNOSIS — R32 Unspecified urinary incontinence: Secondary | ICD-10-CM | POA: Insufficient documentation

## 2014-01-29 DIAGNOSIS — N952 Postmenopausal atrophic vaginitis: Secondary | ICD-10-CM

## 2014-01-29 DIAGNOSIS — M949 Disorder of cartilage, unspecified: Secondary | ICD-10-CM

## 2014-01-29 DIAGNOSIS — F17200 Nicotine dependence, unspecified, uncomplicated: Secondary | ICD-10-CM

## 2014-01-29 DIAGNOSIS — E559 Vitamin D deficiency, unspecified: Secondary | ICD-10-CM

## 2014-01-29 DIAGNOSIS — M8580 Other specified disorders of bone density and structure, unspecified site: Secondary | ICD-10-CM | POA: Insufficient documentation

## 2014-01-29 DIAGNOSIS — N318 Other neuromuscular dysfunction of bladder: Secondary | ICD-10-CM

## 2014-01-29 DIAGNOSIS — M899 Disorder of bone, unspecified: Secondary | ICD-10-CM

## 2014-01-29 DIAGNOSIS — Z2882 Immunization not carried out because of caregiver refusal: Secondary | ICD-10-CM

## 2014-01-29 DIAGNOSIS — F411 Generalized anxiety disorder: Secondary | ICD-10-CM

## 2014-01-29 DIAGNOSIS — R9389 Abnormal findings on diagnostic imaging of other specified body structures: Secondary | ICD-10-CM

## 2014-01-29 DIAGNOSIS — Z1239 Encounter for other screening for malignant neoplasm of breast: Secondary | ICD-10-CM

## 2014-01-29 DIAGNOSIS — Z0000 Encounter for general adult medical examination without abnormal findings: Secondary | ICD-10-CM

## 2014-01-29 DIAGNOSIS — E119 Type 2 diabetes mellitus without complications: Secondary | ICD-10-CM

## 2014-01-29 DIAGNOSIS — N3281 Overactive bladder: Secondary | ICD-10-CM | POA: Insufficient documentation

## 2014-01-29 LAB — POCT URINALYSIS DIPSTICK
Bilirubin, UA: NEGATIVE
Blood, UA: NEGATIVE
GLUCOSE UA: NEGATIVE
Ketones, UA: NEGATIVE
Leukocytes, UA: NEGATIVE
NITRITE UA: NEGATIVE
PH UA: 6
Protein, UA: NEGATIVE
Spec Grav, UA: <=1.005
Urobilinogen, UA: NEGATIVE

## 2014-01-29 LAB — HEMOGLOBIN A1C
Hgb A1c MFr Bld: 7.4 % — ABNORMAL HIGH (ref <5.7)
Mean Plasma Glucose: 166 mg/dL — ABNORMAL HIGH (ref <117)

## 2014-01-29 LAB — CBC WITH DIFFERENTIAL/PLATELET
BASOS ABS: 0 10*3/uL (ref 0.0–0.1)
BASOS PCT: 0 % (ref 0–1)
Eosinophils Absolute: 0.1 10*3/uL (ref 0.0–0.7)
Eosinophils Relative: 1 % (ref 0–5)
HCT: 40.3 % (ref 36.0–46.0)
Hemoglobin: 13.8 g/dL (ref 12.0–15.0)
LYMPHS PCT: 36 % (ref 12–46)
Lymphs Abs: 1.9 10*3/uL (ref 0.7–4.0)
MCH: 29.7 pg (ref 26.0–34.0)
MCHC: 34.2 g/dL (ref 30.0–36.0)
MCV: 86.7 fL (ref 78.0–100.0)
MONO ABS: 0.3 10*3/uL (ref 0.1–1.0)
Monocytes Relative: 6 % (ref 3–12)
NEUTROS ABS: 3 10*3/uL (ref 1.7–7.7)
Neutrophils Relative %: 57 % (ref 43–77)
PLATELETS: 184 10*3/uL (ref 150–400)
RBC: 4.65 MIL/uL (ref 3.87–5.11)
RDW: 14.1 % (ref 11.5–15.5)
WBC: 5.3 10*3/uL (ref 4.0–10.5)

## 2014-01-29 LAB — LIPID PANEL
CHOL/HDL RATIO: 2.9 ratio
Cholesterol: 137 mg/dL (ref 0–200)
HDL: 48 mg/dL (ref >39)
LDL CALC: 62 mg/dL (ref 0–99)
Triglycerides: 136 mg/dL (ref <150)
VLDL: 27 mg/dL (ref 0–40)

## 2014-01-29 LAB — COMPREHENSIVE METABOLIC PANEL
ALT: 26 U/L (ref 0–35)
AST: 17 U/L (ref 0–37)
Albumin: 4.7 g/dL (ref 3.5–5.2)
Alkaline Phosphatase: 88 U/L (ref 39–117)
BILIRUBIN TOTAL: 0.4 mg/dL (ref 0.2–1.2)
BUN: 13 mg/dL (ref 6–23)
CO2: 26 mEq/L (ref 19–32)
Calcium: 9.5 mg/dL (ref 8.4–10.5)
Chloride: 104 mEq/L (ref 96–112)
Creat: 0.74 mg/dL (ref 0.50–1.10)
Glucose, Bld: 143 mg/dL — ABNORMAL HIGH (ref 70–99)
POTASSIUM: 4.1 meq/L (ref 3.5–5.3)
Sodium: 139 mEq/L (ref 135–145)
Total Protein: 6.9 g/dL (ref 6.0–8.3)

## 2014-01-29 MED ORDER — ERGOCALCIFEROL 1.25 MG (50000 UT) PO CAPS: 50000.0000 [IU] | ORAL_CAPSULE | ORAL | Status: DC

## 2014-01-29 NOTE — Assessment & Plan Note (Signed)
In general doing okay, stopped Celexa several months ago and does not want to go on any type of medication for mood

## 2014-01-29 NOTE — Patient Instructions (Signed)
  Thank you for giving me the opportunity to serve you today.    Your diagnosis today includes: Encounter Diagnoses  Name Primary?  . Routine general medical examination at a health care facility Yes  . Type II or unspecified type diabetes mellitus without mention of complication, not stated as uncontrolled   . Dyslipidemia   . Osteopenia   . Atrophic vaginitis   . Screening for breast cancer   . Urinary incontinence   . Tobacco use disorder   . Unspecified vitamin D deficiency   . Overactive bladder   . Generalized anxiety disorder   . Vaccine refused by parent   . Abnormal pelvic exam      Specific recommendations today include:  Check your insurance coverage for having a repeat pneumococcal vaccine. You have had one prior but the current recommendation is a second booster with pneumococcal 13/pneumonia vaccine  Diabetes type 2-we will call with lab results and medication recommendations.   Presumably we will continue metformin 500 twice daily  Continue your cholesterol medication, we will check labs today  I am referring you to gynecology for abnormal pelvic exam, but also discussed with him other medication options for overactive bladder and atrophic vaginitis  Stop tobacco please!  Restart prescription vitamin D once weekly  Go for mammogram  We will plan a followup with gastroenterology next year for potential repeat colonoscopy  Osteopenia-let me think about options as the Fosamax may not be working as good as we would like  Follow up: pending labs and referrals

## 2014-01-29 NOTE — Assessment & Plan Note (Signed)
Saw urology in November at my request, was given a cream but couldn't afford the cream

## 2014-01-29 NOTE — Assessment & Plan Note (Signed)
Still smoking with no desire to quit

## 2014-01-29 NOTE — Assessment & Plan Note (Signed)
Does not check her sugars. She is compliant with medication, says she is trying to eat pretty healthy. Has seen her eye doctor within the last 12 months, checks her feet regularly, no foot concerns no other diabetic symptoms

## 2014-01-29 NOTE — Telephone Encounter (Signed)
She can return for nurse visit for Prevnar 13. I believe she had PPSV 23 over a year ago, but double check.

## 2014-01-29 NOTE — Assessment & Plan Note (Signed)
She ran out of prescription vitamin D. She is not taking any vitamin D at the moment

## 2014-01-29 NOTE — Assessment & Plan Note (Signed)
Taking Fosamax once weekly for several years.

## 2014-01-29 NOTE — Assessment & Plan Note (Signed)
Lately overactive bladder hasn't been as much of a problem . I referred her to urology back in November, however she says that she couldn't afford the medication I prescribed

## 2014-01-29 NOTE — Assessment & Plan Note (Signed)
Compliant with medication without complaint 

## 2014-01-29 NOTE — Telephone Encounter (Signed)
Patient called back and said she can get the pneumonia vaccine for medical purpose's Dx. She also needs a Rx for Vitamin D. She said she will pick it up tomorrow. CLS  She made her own G[facility] appointment at [facility]. CLS

## 2014-01-29 NOTE — Progress Notes (Signed)
Subjective:   HPI  Marissa Oliver is a 73 y.o. female who presents for a complete physical and f/u on chronic medical problems.  Medical care team includes:  Dr. Cathie Olden, cardiology in the past  Dermatology, Dr. Allyson Sabal  Opthalmology, Dr. Charlotta Newton, PA-C here for primary care  Dr. Consuella Lose, Urology   Preventative care: Last ophthalmology visit:YES LAST EYE EXAM 10/2013 [place] Last dental visit:N/A FALSE TEETH Last colonoscopy:08/2010 Last mammogram:2012 Last gynecological exam:? Last EKG:10/2004 Last labs:2014  Prior vaccinations: TD or Tdap:03/2012 Influenza:08/2013 Pneumococcal:12/2010 Shingles/Zostavax:N/A Other: bone density- 11/2013  Advanced directive:YES Health care power of attorney:YES Living will:YES  Concerns: Can't afford some of the medicaitons.  Wants to stop Celexa.  Already stopped this.    Current smoker Still smoking with no desire to quit  Atrophic vaginitis Saw urology in November at my request, was given a cream but couldn't afford the cream  Anxiety state, unspecified In general doing okay, stopped Celexa several months ago and does not want to go on any type of medication for mood  Dyslipidemia Compliant with medication without complaint  Overactive bladder Lately overactive bladder hasn't been as much of a problem . I referred her to urology back in November, however she says that she couldn't afford the medication I prescribed  Type II or unspecified type diabetes mellitus without mention of complication, not stated as uncontrolled Does not check her sugars. She is compliant with medication, says she is trying to eat pretty healthy. Has seen her eye doctor within the last 12 months, checks her feet regularly, no foot concerns no other diabetic symptoms  Unspecified vitamin D deficiency She ran out of prescription vitamin D. She is not taking any vitamin D at the moment  Osteopenia Taking Fosamax once  weekly for several years.     Reviewed their medical, surgical, family, social, medication, and allergy history and updated chart as appropriate.  Past Medical History  Diagnosis Date  . Dyslipidemia   . Diabetes mellitus 2005  . Smoker   . Hemorrhoids   . OAB (overactive bladder)   . GAD (generalized anxiety disorder)   . Diverticulosis   . Colon polyp     tubular adenoma (colonoscopy q5 yrs)  . Atrophic vaginitis     estrace cream rx'd by Dr. Karsten Ro  . Osteopenia 11/2013     Bone Density, loss of bone density compared to prior exam 2012    Past Surgical History  Procedure Laterality Date  . Abdominal hysterectomy  1972    partial  . Pubovaginal sling  04/1998  . Tubal ligation    . Appendectomy    . Cardiovascular stress test  12/25/2004    EF 65%. OVERALL NORMAL STRESS CARDIOLITE. NO EVIDENCE  OF ISCHEMIA  . Colonoscopy  08/2010    History   Social History  . Marital Status: Divorced    Spouse Name: N/A    Number of Children: N/A  . Years of Education: N/A   Occupational History  . Not on file.   Social History Main Topics  . Smoking status: Current Every Day Smoker -- 0.50 packs/day    Last Attempt to Quit: 05/22/2012  . Smokeless tobacco: Never Used  . Alcohol Use: No  . Drug Use: No  . Sexual Activity: Not Currently   Other Topics Concern  . Not on file   Social History Narrative  . No narrative on file    Family History  Problem Relation Age of Onset  .  Arthritis Mother   . Heart failure Mother   . Mental illness Father   . Kidney disease Father   . Stroke Father   . Heart attack Father   . Heart disease Brother   . Cancer Brother     prostate  . Stroke Maternal Aunt   . Cancer Maternal Uncle   . Stroke Paternal Grandmother     Current outpatient prescriptions:alendronate (FOSAMAX) 70 MG tablet, Take 70 mg by mouth every 7 (seven) days. Sunday Take with a full glass of water on an empty stomach., Disp: , Rfl: ;  citalopram (CELEXA)  20 MG tablet, Take 1 tablet (20 mg total) by mouth daily., Disp: 90 tablet, Rfl: 1;  metFORMIN (GLUCOPHAGE) 500 MG tablet, Take 1 tablet (500 mg total) by mouth 2 (two) times daily with a meal., Disp: 180 tablet, Rfl: 1 pravastatin (PRAVACHOL) 20 MG tablet, Take 20 mg by mouth daily., Disp: , Rfl: ;  ergocalciferol (VITAMIN D2) 50000 UNITS capsule, Take 1 capsule (50,000 Units total) by mouth once a week., Disp: 12 capsule, Rfl: 3  No Known Allergies     Review of Systems Constitutional: -fever, -chills, -sweats, -unexpected weight change, -decreased appetite, -fatigue Allergy: -sneezing, -itching, -congestion Dermatology: -changing moles, --rash, -lumps ENT: -runny nose, -ear pain, -sore throat, -hoarseness, -sinus pain, -teeth pain, - ringing in ears, -hearing loss, -nosebleeds Cardiology: -chest pain, -palpitations, -swelling, -difficulty breathing when lying flat, -waking up short of breath Respiratory: -cough, -shortness of breath, -difficulty breathing with exercise or exertion, -wheezing, -coughing up blood Gastroenterology: -abdominal pain, -nausea, -vomiting, -diarrhea, -constipation, -blood in stool, -changes in bowel movement, -difficulty swallowing or eating Hematology: -bleeding, -bruising  Musculoskeletal: -joint aches, -muscle aches, -joint swelling, -back pain, -neck pain, -cramping, -changes in gait Ophthalmology: denies vision changes, eye redness, itching, discharge Urology: -burning with urination, -difficulty urinating, -blood in urine, -urinary frequency, -urgency, -incontinence Neurology: -headache, -weakness, -tingling, -numbness, -memory loss, -falls, -dizziness Psychology: -depressed mood, -agitation, -sleep problems     Objective:   Physical Exam  BP 140/70  Pulse 72  Temp(Src) 97.7 F (36.5 C) (Oral)  Resp 14  Ht 5\' 1"  (1.549 m)  Wt 173 lb (78.472 kg)  BMI 32.70 kg/m2  General appearance: alert, no distress, WD/WN, white female Skin: scattered brown  aging spots of arms bilat, small areas of ecchymosis of forearms bilat, several small relatively flat cherry hemangiomas of right and left upper chest, left breast with 30mm round cherry hemangioma, right cheek with 47mm somewhat raised round lesion, slight pearly characteristic HEENT: normocephalic, conjunctiva/corneas normal, sclerae anicteric, PERRLA, EOMi, nares patent, no discharge or erythema, pharynx normal Oral cavity: MMM, tongue normal, teeth - denture upper and lower Neck: supple, no lymphadenopathy, no thyromegaly, no masses, normal ROM, no bruits Chest: non tender, normal shape and expansion Heart: RRR, normal S1, S2, no murmurs Lungs: CTA bilaterally, no wheezes, rhonchi, or rales Abdomen: +bs, soft, non tender, non distended, no masses, no hepatomegaly, no splenomegaly, no bruits Back: non tender, normal ROM, no scoliosis Musculoskeletal: upper extremities non tender, no obvious deformity, normal ROM throughout, lower extremities non tender, no obvious deformity, normal ROM throughout Extremities: no edema, no cyanosis, no clubbing Pulses: 2+ symmetric, upper extremities, normal cap refill, 1+ pedal pulses Neurological: feet sensation seem somewhat decrease bilat in general with monofilament exam, otherwise alert, oriented x 3, CN2-12 intact, strength normal upper extremities and lower extremities, sensation normal throughout, DTRs 2+ throughout, no cerebellar signs, gait normal Psychiatric: normal affect, behavior normal, pleasant  Breast:  nontender, no masses or lumps, no skin changes, no nipple discharge or inversion, no axillary lymphadenopathy Gyn: atrophic external changes, urethra normal appearing, there seems to be an area with nodular tissue of superior vaginal wall just posterior to urethra, seems somewhat abnormal, unable to do speculum exam due to discomfort, no abnormal vaginal discharge.  Uterus and adnexa not enlarged, nontender, no masses.  Exam chaperoned by  nurse. Rectal: anus normal tone, occult negative stool, no mass    Assessment and Plan :    Encounter Diagnoses  Name Primary?  . Routine general medical examination at a health care facility Yes  . Type II or unspecified type diabetes mellitus without mention of complication, not stated as uncontrolled   . Dyslipidemia   . Osteopenia   . Atrophic vaginitis   . Screening for breast cancer   . Urinary incontinence   . Tobacco use disorder   . Unspecified vitamin D deficiency   . Overactive bladder   . Generalized anxiety disorder   . Vaccine refused by parent   . Abnormal pelvic exam       Physical exam - discussed healthy lifestyle, diet, exercise, preventative care, vaccinations, and addressed their concerns.  Handout given. Blood pressure was elevated today but it is usually normal. Consider adding ACEi given history of diabetes Labs today for routine followup on diabetes and cholesterol.  She is compliant with medication Anxiety-of late things are going okay, she has already stopped Celexa and refuses to restart this Osteopenia-discussed her most recent bone density scan in the changes from prior, consider other options of Fosamax may not be working as well as we would like Vitamin D deficiency-restart prescription vitamin D Referral to gynecology for abnormal vaginal exam, atrophic vaginitis and overactive bladder.  We referred to urology last year, she says no exam was done, and the medications were too expensive. She will go for updated mammogram Advise she go back and see dermatology yearly for screening with Dr. Allyson Sabal   Specific recommendations today include:  Check your insurance coverage for having a repeat pneumococcal vaccine. You have had one prior but the current recommendation is a second booster with pneumococcal 13/pneumonia vaccine  Diabetes type 2-we will call with lab results and medication recommendations.   Presumably we will continue metformin 500 twice  daily  Continue your cholesterol medication, we will check labs today  I am referring you to gynecology for abnormal pelvic exam, but also discussed with him other medication options for overactive bladder and atrophic vaginitis  Stop tobacco please!  Restart prescription vitamin D once weekly  Go for mammogram  We will plan a followup with gastroenterology next year for potential repeat colonoscopy  Osteopenia-let me think about options as the Fosamax may not be working as good as we would like  Follow up: pending labs and referrals

## 2014-01-29 NOTE — Telephone Encounter (Signed)
PATIENT IS AWARE OF HER APPOINTMENT TO HAVE HER MAMMOGRAM ON 02/06/14 @ 915 AM AT [place] ON 02/13/14 @ 1000 AM. CLS [phone] FAX # [phone].

## 2014-01-30 ENCOUNTER — Other Ambulatory Visit (INDEPENDENT_AMBULATORY_CARE_PROVIDER_SITE_OTHER): Payer: Medicare HMO

## 2014-01-30 ENCOUNTER — Telehealth: Payer: Self-pay | Admitting: Family Medicine

## 2014-01-30 ENCOUNTER — Other Ambulatory Visit: Payer: Self-pay | Admitting: Medical

## 2014-01-30 DIAGNOSIS — Z23 Encounter for immunization: Secondary | ICD-10-CM

## 2014-01-30 MED ORDER — PRAVASTATIN SODIUM 20 MG PO TABS: 20.0000 mg | ORAL_TABLET | Freq: Every day | ORAL | Status: DC

## 2014-01-30 MED ORDER — METFORMIN HCL 1000 MG PO TABS: 1000.0000 mg | ORAL_TABLET | Freq: Two times a day (BID) | ORAL | Status: DC

## 2014-01-30 NOTE — Telephone Encounter (Signed)
Pt dropped off advanced directive. I am sending back in folder to be signed off on.

## 2014-01-31 ENCOUNTER — Telehealth: Payer: Self-pay | Admitting: Medical

## 2014-02-06 LAB — HM MAMMOGRAPHY

## 2014-02-12 LAB — HM MAMMOGRAPHY: HM Mammogram: NEGATIVE

## 2014-02-14 ENCOUNTER — Other Ambulatory Visit: Payer: Self-pay

## 2014-02-14 NOTE — Telephone Encounter (Signed)
Based on nurse's med reconciliation last visit she is on pravastatin which is what I wanted her to use

## 2014-02-14 NOTE — Telephone Encounter (Signed)
Advised patient to use pravastatin. Patient has received Rx from pharmacy, and will call if she has any problems with this medication. Patient is requesting a Rx for fosamax or another Rx for osteoporosis. She mentioned that you were going to check with Dr. Redmond School and decide. Whatever is decided she does NOT want it to be sent to the pharmacy. She prefers to pick up written rx and she will need a 3 month supply. Thanks

## 2014-02-14 NOTE — Telephone Encounter (Signed)
Marissa Oliver, Looks like this patient was on Atorvastatin2014, now pravastatin, which do you want her to use? She is confused.

## 2014-02-19 ENCOUNTER — Encounter: Payer: Self-pay | Admitting: Internal Medicine

## 2014-02-26 ENCOUNTER — Encounter: Payer: Self-pay | Admitting: Medical

## 2014-02-26 ENCOUNTER — Encounter: Payer: Self-pay | Admitting: Gynecology

## 2014-02-26 ENCOUNTER — Other Ambulatory Visit (HOSPITAL_COMMUNITY)
Admission: RE | Admit: 2014-02-26 | Discharge: 2014-02-26 | Disposition: A | Discharge status: Home / Self Care | Payer: Medicare HMO | Source: Ambulatory Visit | Attending: Gynecology | Admitting: Gynecology

## 2014-02-26 ENCOUNTER — Ambulatory Visit (INDEPENDENT_AMBULATORY_CARE_PROVIDER_SITE_OTHER): Payer: Medicare HMO | Admitting: Gynecology

## 2014-02-26 VITALS — BP 128/88 | Wt 173.0 lb

## 2014-02-26 DIAGNOSIS — Z1272 Encounter for screening for malignant neoplasm of vagina: Secondary | ICD-10-CM

## 2014-02-26 DIAGNOSIS — N952 Postmenopausal atrophic vaginitis: Secondary | ICD-10-CM

## 2014-02-26 DIAGNOSIS — N3281 Overactive bladder: Secondary | ICD-10-CM

## 2014-02-26 DIAGNOSIS — N318 Other neuromuscular dysfunction of bladder: Secondary | ICD-10-CM

## 2014-02-26 DIAGNOSIS — Z1151 Encounter for screening for human papillomavirus (HPV): Secondary | ICD-10-CM | POA: Insufficient documentation

## 2014-02-26 DIAGNOSIS — Z124 Encounter for screening for malignant neoplasm of cervix: Secondary | ICD-10-CM | POA: Insufficient documentation

## 2014-02-26 MED ORDER — NONFORMULARY OR COMPOUNDED ITEM: Status: DC

## 2014-02-26 NOTE — Patient Instructions (Addendum)
Hormone Therapy At menopause, your body begins making less estrogen and progesterone hormones. This causes the body to stop having menstrual periods. This is because estrogen and progesterone hormones control your periods and menstrual cycle. A lack of estrogen may cause symptoms such as:  Hot flushes (or hot flashes).  Vaginal dryness.  Dry skin.  Loss of sex drive.  Risk of bone loss (osteoporosis). When this happens, you may choose to take hormone therapy to get back the estrogen lost during menopause. When the hormone estrogen is given alone, it is usually referred to as ET (Estrogen Therapy). When the hormone progestin is combined with estrogen, it is generally called HT (Hormone Therapy). This was formerly known as hormone replacement therapy (HRT). Your caregiver can help you make a decision on what will be best for you. The decision to use HT seems to change often as new studies are done. Many studies do not agree on the benefits of hormone replacement therapy. LIKELY BENEFITS OF HT INCLUDE PROTECTION FROM:  Hot Flushes (also called hot flashes) - A hot flush is a sudden feeling of heat that spreads over the face and body. The skin may redden like a blush. It is connected with sweats and sleep disturbance. Women going through menopause may have hot flushes a few times a month or several times per day depending on the woman.  Osteoporosis (bone loss)- Estrogen helps guard against bone loss. After menopause, a woman's bones slowly lose calcium and become weak and brittle. As a result, bones are more likely to break. The hip, wrist, and spine are affected most often. Hormone therapy can help slow bone loss after menopause. Weight bearing exercise and taking calcium with vitamin D also can help prevent bone loss. There are also medications that your caregiver can prescribe that can help prevent osteoporosis.  Vaginal Dryness - Loss of estrogen causes changes in the vagina. Its lining may  become thin and dry. These changes can cause pain and bleeding during sexual intercourse. Dryness can also lead to infections. This can cause burning and itching. (Vaginal estrogen treatment can help relieve pain, itching, and dryness.)  Urinary Tract Infections are more common after menopause because of lack of estrogen. Some women also develop urinary incontinence because of low estrogen levels in the vagina and bladder.  Possible other benefits of estrogen include a positive effect on mood and short-term memory in women. RISKS AND COMPLICATIONS  Using estrogen alone without progesterone causes the lining of the uterus to grow. This increases the risk of lining of the uterus (endometrial) cancer. Your caregiver should give another hormone called progestin if you have a uterus.  Women who take combined (estrogen and progestin) HT appear to have an increased risk of breast cancer. The risk appears to be small, but increases throughout the time that HT is taken.  Combined therapy also makes the breast tissue slightly denser which makes it harder to read mammograms (breast X-rays).  Combined, estrogen and progesterone therapy can be taken together every day, in which case there may be spotting of blood. HT therapy can be taken cyclically in which case you will have menstrual periods. Cyclically means HT is taken for a set amount of days, then not taken, then this process is repeated.  HT may increase the risk of stroke, heart attack, breast cancer and forming blood clots in your leg.  Transdermal estrogen (estrogen that is absorbed through the skin with a patch or a cream) may have more positive results with:    Cholesterol.  Blood pressure.  Blood clots. Having the following conditions may indicate you should not have HT:  Endometrial cancer.  Liver disease.  Breast cancer.  Heart disease.  History of blood clots.  Stroke. TREATMENT   If you choose to take HT and have a uterus,  usually estrogen and progestin are prescribed.  Your caregiver will help you decide the best way to take the medications.  Possible ways to take estrogen include:  Pills.  Patches.  Gels.  Sprays.  Vaginal estrogen cream, rings and tablets.  It is best to take the lowest dose possible that will help your symptoms and take them for the shortest period of time that you can.  Hormone therapy can help relieve some of the problems (symptoms) that affect women at menopause. Before making a decision about HT, talk to your caregiver about what is best for you. Be well informed and comfortable with your decisions. HOME CARE INSTRUCTIONS   Follow your caregivers advice when taking the medications.  A Pap test is done to screen for cervical cancer.  The first Pap test should be done at age 51.  Between ages 71 and 85, Pap tests are repeated every 2 years.  Beginning at age 56, you are advised to have a Pap test every 3 years as long as your past 3 Pap tests have been normal.  Some women have medical problems that increase the chance of getting cervical cancer. Talk to your caregiver about these problems. It is especially important to talk to your caregiver if a new problem develops soon after your last Pap test. In these cases, your caregiver may recommend more frequent screening and Pap tests.  The above recommendations are the same for women who have or have not gotten the vaccine for HPV (Human Papillomavirus).  If you had a hysterectomy for a problem that was not a cancer or a condition that could lead to cancer, then you no longer need Pap tests. However, even if you no longer need a Pap test, a regular exam is a good idea to make sure no other problems are starting.   If you are between ages 14 and 52, and you have had normal Pap tests going back 10 years, you no longer need Pap tests. However, even if you no longer need a Pap test, a regular exam is a good idea to make sure no  other problems are starting.   If you have had past treatment for cervical cancer or a condition that could lead to cancer, you need Pap tests and screening for cancer for at least 20 years after your treatment.  If Pap tests have been discontinued, risk factors (such as a new sexual partner) need to be re-assessed to determine if screening should be resumed.  Some women may need screenings more often if they are at high risk for cervical cancer.  Get mammograms done as per the advice of your caregiver. SEEK IMMEDIATE MEDICAL CARE IF:  You develop abnormal vaginal bleeding.  You have pain or swelling in your legs, shortness of breath, or chest pain.  You develop dizziness or headaches.  You have lumps or changes in your breasts or armpits.  You have slurred speech.  You develop weakness or numbness of your arms or legs.  You have pain, burning, or bleeding when urinating.  You develop abdominal pain. Document Released: 07/17/2003 Document Revised: 01/10/2012 Document Reviewed: 11/04/2010 [facility] Patient Information 2014 [place], [place]. Oxybutynin tablets What is this medicine?  OXYBUTYNIN (ox i BYOO ti nin) is used to treat overactive bladder. This medicine reduces the amount of bathroom visits. It may also help to control wetting accidents. This medicine may be used for other purposes; ask your health care provider or pharmacist if you have questions. COMMON BRAND NAME(S): Ditropan What should I tell my health care provider before I take this medicine? They need to know if you have any of these conditions: -dementia -difficulty passing urine -glaucoma -intestinal obstruction -kidney disease -liver disease -an unusual or allergic reaction to oxybutynin, other medicines, foods, dyes, or preservatives -pregnant or trying to get pregnant -breast-feeding How should I use this medicine? Take this medicine by mouth with a glass of water. Follow the directions on the  prescription label. You can take this medicine with or without food. Take your medicine at regular intervals. Do not take your medicine more often than directed. Talk to your pediatrician regarding the use of this medicine in children. Special care may be needed. While this drug may be prescribed for children as young as 5 years for selected conditions, precautions do apply. Overdosage: If you think you have taken too much of this medicine contact a poison control center or emergency room at once. NOTE: This medicine is only for you. Do not share this medicine with others. What if I miss a dose? If you miss a dose, take it as soon as you can. If it is almost time for your next dose, take only that dose. Do not take double or extra doses. What may interact with this medicine? -antihistamines for allergy, cough and cold -atropine -certain medicines for bladder problems like oxybutynin, tolterodine -certain medicines for Parkinson's disease like benztropine, trihexyphenidyl -certain medicines for stomach problems like dicyclomine, hyoscyamine -certain medicines for travel sickness like scopolamine -clarithromycin -erythromycin -ipratropium -medicines for fungal infections, like fluconazole, itraconazole, ketoconazole or voriconazole This list may not describe all possible interactions. Give your health care provider a list of all the medicines, herbs, non-prescription drugs, or dietary supplements you use. Also tell them if you smoke, drink alcohol, or use illegal drugs. Some items may interact with your medicine. What should I watch for while using this medicine? It may take a few weeks to notice the full benefit from this medicine. You may need to limit your intake tea, coffee, caffeinated sodas, and alcohol. These drinks may make your symptoms worse. You may get drowsy or dizzy. Do not drive, use machinery, or do anything that needs mental alertness until you know how this medicine affects you.  Do not stand or sit up quickly, especially if you are an older patient. This reduces the risk of dizzy or fainting spells. Alcohol may interfere with the effect of this medicine. Avoid alcoholic drinks. Your mouth may get dry. Chewing sugarless gum or sucking hard candy, and drinking plenty of water may help. Contact your doctor if the problem does not go away or is severe. This medicine may cause dry eyes and blurred vision. If you wear contact lenses, you may feel some discomfort. Lubricating drops may help. See your eyecare professional if the problem does not go away or is severe. Avoid extreme heat. This medicine can cause you to sweat less than normal. Your body temperature could increase to dangerous levels, which may lead to heat stroke. What side effects may I notice from receiving this medicine? Side effects that you should report to your doctor or health care professional as soon as possible: -allergic reactions like skin rash,  itching or hives, swelling of the face, lips, or tongue -agitation -breathing problems -confusion -fever -flushing (reddening of the skin) -hallucinations -memory loss -pain or difficulty passing urine -palpitations -unusually weak or tired Side effects that usually do not require medical attention (report to your doctor or health care professional if they continue or are bothersome): -constipation -headache -sexual difficulties (impotence) This list may not describe all possible side effects. Call your doctor for medical advice about side effects. You may report side effects to FDA at 1-800-FDA-1088. Where should I keep my medicine? Keep out of the reach of children. Store at room temperature between 15 and 30 degrees C (59 and 86 degrees F). Protect from moisture and humidity. Throw away any unused medicine after the expiration date. NOTE: This sheet is a summary. It may not cover all possible information. If you have questions about this medicine, talk  to your doctor, pharmacist, or health care provider.  2014, Elsevier/Gold Standard. (2008-05-14 16:50:01)

## 2014-02-26 NOTE — Progress Notes (Signed)
   Patient is a 73 year old who was instructed by her primary care provider to see a gynecologist in reference to abnormal finding during her medical exam on 01/29/2014 as follows:  "Gyn: atrophic external changes, urethra normal appearing, there seems to be an area with nodular tissue of superior vaginal wall just posterior to urethra, seems somewhat abnormal, unable to do speculum exam due to discomfort, no abnormal vaginal discharge. Uterus and adnexa not enlarged, nontender, no masses"  The patient has complained of vaginal dryness and irritation. She's currently on no estrogen replacement therapy. Patient also with history of overactive bladder had seen the urologist and had been placed on Mason but patient short time because of cost and is still having symptoms.  Patient with hysterectomy many years ago for benign entity denies any history of abnormal Pap smears prior to her hysterectomy are after. She does not recall when she had a Pap smear.  Exam: Bartholin urethra Skene was within normal limits Vagina: Atrophic changes Vaginal cuff intact Bimanual exam no palpable mass or tenderness Rectal exam deferred  Assessment/plan: Postmenopausal patient with prior history of total abdominal hysterectomy for benign entities with no abnormalities and pelvic exam with the exception of vaginal atrophy. Patient denies any past history of breast cancer. We discussed placing her on a low dose vaginal estrogen to apply twice a week. Also because of the expanse from her previous anticholinergic agent she is going to be prescribed Ditropan 5 mg 1 by mouth daily which is more cost-effective. Patient denies any history of glaucoma. Literature and information on medication was provided. She will continue to follow with her primary care provider. See past medical history list outlined her past medical history and medications. Pap smear was done today. Patient with history of osteopenia had been on Fosamax for  over 4 years but is no longer taking it.

## 2014-03-06 ENCOUNTER — Other Ambulatory Visit: Payer: Self-pay | Admitting: Medical

## 2014-03-06 MED ORDER — RISEDRONATE SODIUM 35 MG PO TABS: 35.0000 mg | ORAL_TABLET | ORAL | Status: DC

## 2014-03-07 ENCOUNTER — Telehealth: Payer: Self-pay

## 2014-03-07 NOTE — Telephone Encounter (Signed)
Patient states insurance cost for Actonel is too great and needs another generic Rx other than alendronate. Plan?

## 2014-03-07 NOTE — Telephone Encounter (Signed)
Advised patient Marissa Oliver had sent Actonel to her mail order pharmacy, with instructions and that it was to replace her fosamax. Patient requests written Rx for Vit D 50,000 units, 1 tablet po q week #12 mailed to patient.

## 2014-03-07 NOTE — Telephone Encounter (Signed)
Patient says she doesn't want to spend money on this issue and she is not going to take any medications for osteoporosis.

## 2014-03-07 NOTE — Telephone Encounter (Signed)
Message copied by Louie Bun on Thu Mar 07, 2014 12:51 PM ------      Message from: Carlena Hurl      Created: Wed Mar 06, 2014  9:03 AM       I changed her to Actonel instead of Boniva for osteoporosis.   Begin this instead, once weekly on empty stomach first thing in the morning, remain upright for an hour (don't lie down).  This will replace Fosamax.            shane       ----- Message -----         From: Jake Shark Scales         Sent: 02/05/2014  12:46 PM           To: Carlena Hurl, PA-C                        ----- Message -----         From: Carlena Hurl, PA-C         Sent: 01/30/2014   7:59 AM           To: Jake Shark Scales            Discuss bisphosphonates with MD       ------

## 2014-03-07 NOTE — Telephone Encounter (Signed)
pls have her talk to her insurer or pharmacist about other option such as Boniva, Reclast?

## 2014-03-11 NOTE — Telephone Encounter (Signed)
I recommend she take at least 1200mg  calcium + 2000 Vit D daily OTC.   She does have osteoporosis per scan, so I do recommend some form of preventative treatment which includes regular exercise, some form of weight bearing exercise, and ideally a medication like Fosamax to help reduce bone loss.    options include once monthly Boniva, the Actonel that was sent, there is a nasal spray, and there is once yearly reclast infusion.  If she wants to refrain for now, then document her refusal after giving her those options.

## 2014-03-11 NOTE — Telephone Encounter (Signed)
I went over the message from Marissa Oliver [facility] with the patient. She states that she will take the medication if it fits into her budget because she doesn't have a lot of money. She states that she called over to her pharmacy to try and get the cost but they said they needed prior auth first. CLS

## 2014-03-15 ENCOUNTER — Encounter: Payer: Self-pay | Admitting: Cardiovascular Disease

## 2014-03-16 ENCOUNTER — Telehealth: Payer: Self-pay | Admitting: Medical

## 2014-03-18 NOTE — Telephone Encounter (Signed)
Recv'd response back on P.A., ins requires step therapy needs trial of Ibandronate.  Do you want to switch?

## 2014-03-18 NOTE — Telephone Encounter (Signed)
What are they requiring? She failed Fosamax

## 2014-03-19 ENCOUNTER — Other Ambulatory Visit: Payer: Self-pay | Admitting: Medical

## 2014-03-19 MED ORDER — IBANDRONATE SODIUM 150 MG PO TABS: 150.0000 mg | ORAL_TABLET | ORAL | Status: DC

## 2014-03-19 NOTE — Telephone Encounter (Signed)
I sent Boniva once monthly

## 2014-03-19 NOTE — Progress Notes (Signed)
I fax the patients Rx over to her pharmacy. CLS

## 2014-03-19 NOTE — Telephone Encounter (Signed)
Ins company called & pt must have trial of Boniva/ibandronate.  Per pt she has not tried this medication.  Per insurance company ibandronate 150 mg  is Tier 1 medication.  She is willing to try if it's not too expensive. Please switch to Ibandronate 150mg  to mail order pharmacy for 90 days

## 2014-03-28 ENCOUNTER — Ambulatory Visit (INDEPENDENT_AMBULATORY_CARE_PROVIDER_SITE_OTHER): Payer: Medicare HMO | Admitting: Cardiovascular Disease

## 2014-03-28 ENCOUNTER — Encounter: Payer: Self-pay | Admitting: Cardiovascular Disease

## 2014-03-28 VITALS — BP 164/75 | HR 67 | Ht 61.0 in | Wt 169.0 lb

## 2014-03-28 DIAGNOSIS — R9431 Abnormal electrocardiogram [ECG] [EKG]: Secondary | ICD-10-CM

## 2014-03-28 DIAGNOSIS — E7800 Pure hypercholesterolemia, unspecified: Secondary | ICD-10-CM

## 2014-03-28 DIAGNOSIS — E785 Hyperlipidemia, unspecified: Secondary | ICD-10-CM

## 2014-03-28 DIAGNOSIS — F17200 Nicotine dependence, unspecified, uncomplicated: Secondary | ICD-10-CM

## 2014-03-28 NOTE — Progress Notes (Signed)
Marissa Oliver Date of Birth  August 16, 1941       [place] [address], [address], [place], [place]  [address]   [place], [place]  [address] [place]   Fax  [phone]     Fax [phone]  Problem List: 1. Hypertension 2. Diabetes Mellitus 3. Hyperlipidemia   History of Present Illness:  Marissa Oliver presents today for further evaluation of some palpitations that she's had recently.  I've seen her in the past for evaluation but do not have the old chart available. She has risk factors for cardiac disease including diabetes mellitus, cigarette smoking, dyslipidemia. She is the sister of another patient of mine - [employer].    She has had some fluttering in her chest, lasts only 1 second or so.  Not associated with any CP, dyspnea, syncope.   No angina.  Not exercising.   She has a history of abnormal EKG. We performed a stress/ Lexiscan  myoview  in 2006 which was normal.  She continues to smoke.  _+ Fhx of CAD ( brother)   Current Outpatient Prescriptions on File Prior to Visit  Medication Sig Dispense Refill  . ergocalciferol (VITAMIN D2) 50000 UNITS capsule Take 1 capsule (50,000 Units total) by mouth once a week.  12 capsule  3  . pravastatin (PRAVACHOL) 20 MG tablet Take 1 tablet (20 mg total) by mouth daily.  90 tablet  3   No current facility-administered medications on file prior to visit.    No Known Allergies  Past Medical History  Diagnosis Date  . Dyslipidemia   . Diabetes mellitus 2005  . Smoker   . Hemorrhoids   . OAB (overactive bladder)   . GAD (generalized anxiety disorder)   . Diverticulosis   . Colon polyp     tubular adenoma (colonoscopy q5 yrs)  . Atrophic vaginitis     estrace cream rx'd by Dr. Karsten Ro  . Osteopenia 11/2013     Bone Density, loss of bone density compared to prior exam 2012  . Psoriasis     mild, sees [facility]    Past Surgical History   Procedure Laterality Date  . Abdominal hysterectomy  1972    partial  . Pubovaginal sling  04/1998  . Tubal ligation    . Appendectomy    . Cardiovascular stress test  12/25/2004    EF 65%. OVERALL NORMAL STRESS CARDIOLITE. NO EVIDENCE  OF ISCHEMIA  . Colonoscopy  08/2010    History  Smoking status  . Current Every Day Smoker -- 0.50 packs/day  . Last Attempt to Quit: 05/22/2012  Smokeless tobacco  . Never Used    History  Alcohol Use No    Family History  Problem Relation Age of Onset  . Arthritis Mother   . Heart failure Mother   . Mental illness Father   . Kidney disease Father   . Stroke Father   . Heart attack Father   . Heart disease Brother   . Cancer Brother     prostate  . Stroke Maternal Aunt   . Cancer Maternal Uncle   . Stroke Paternal Grandmother     Reviw of Systems:  Reviewed in the HPI.  All other systems are negative.  Physical Exam: Blood pressure 164/75, pulse 67, height 5\' 1"  (1.549 m), weight 169 lb (76.658 kg). Wt Readings from Last 3 Encounters:  03/28/14 169  lb (76.658 kg)  02/26/14 173 lb (78.472 kg)  01/29/14 173 lb (78.472 kg)     General: Well developed, well nourished, in no acute distress.  Head: Normocephalic, atraumatic, sclera non-icteric, mucus membranes are moist,   Neck: Supple. Carotids are 2 + without bruits. No JVD   Lungs: Clear   Heart: RR, normal S1S2  Abdomen: Soft, non-tender, non-distended with normal bowel sounds.  Msk:  Strength and tone are normal   Extremities: No clubbing or cyanosis. No edema.  Distal pedal pulses are 2+ and equal    Neuro: CN II - XII intact.  Alert and oriented X 3.   Psych:  Normal   ECG: Mar 28, 2014:  NSR at 67/  T wave inversion  V1-V3  Assessment / Plan:

## 2014-03-28 NOTE — Assessment & Plan Note (Addendum)
She has an abnormal ECG.  She has not had any angina or dyspnea.  She has palpitations that I think are PVCs.    I've encouraged her to exercise on a regular basis. We have advised her to stop smoking. This point she's not having any symptoms of angina do not think that a stress test as indicated. I would have a low threshold to perform a stress test if he develops any symptoms.    I will continue to see her on a yearly basis.

## 2014-03-28 NOTE — Assessment & Plan Note (Signed)
Well controlled at present.  Continue current meds.

## 2014-03-28 NOTE — Patient Instructions (Signed)
Your physician recommends that you continue on your current medications as directed. Please refer to the Current Medication list given to you today.  Your physician wants you to follow-up in: 1 year with Dr. Nahser.  You will receive a reminder letter in the mail two months in advance. If you don't receive a letter, please call our office to schedule the follow-up appointment.  

## 2014-03-28 NOTE — Assessment & Plan Note (Signed)
Advised her to stop.

## 2014-05-21 ENCOUNTER — Telehealth: Payer: Self-pay

## 2014-05-21 NOTE — Telephone Encounter (Signed)
Called pt to inform her she is past due for diabetes check left message to please call and make appointment

## 2014-07-15 ENCOUNTER — Telehealth: Payer: Self-pay | Admitting: Medical

## 2014-07-15 ENCOUNTER — Ambulatory Visit (INDEPENDENT_AMBULATORY_CARE_PROVIDER_SITE_OTHER): Payer: Medicare HMO | Admitting: Medical

## 2014-07-15 ENCOUNTER — Encounter: Payer: Self-pay | Admitting: Medical

## 2014-07-15 VITALS — BP 120/70 | HR 77 | Temp 98.0°F | Resp 14 | Wt 168.0 lb

## 2014-07-15 DIAGNOSIS — E785 Hyperlipidemia, unspecified: Secondary | ICD-10-CM

## 2014-07-15 DIAGNOSIS — E669 Obesity, unspecified: Secondary | ICD-10-CM

## 2014-07-15 DIAGNOSIS — T457X5A Adverse effect of anticoagulant antagonists, vitamin K and other coagulants, initial encounter: Secondary | ICD-10-CM

## 2014-07-15 DIAGNOSIS — E119 Type 2 diabetes mellitus without complications: Secondary | ICD-10-CM

## 2014-07-15 LAB — COMPREHENSIVE METABOLIC PANEL
ALK PHOS: 88 U/L (ref 39–117)
ALT: 18 U/L (ref 0–35)
AST: 16 U/L (ref 0–37)
Albumin: 4.6 g/dL (ref 3.5–5.2)
BUN: 15 mg/dL (ref 6–23)
CO2: 27 meq/L (ref 19–32)
Calcium: 9.6 mg/dL (ref 8.4–10.5)
Chloride: 104 mEq/L (ref 96–112)
Creat: 0.7 mg/dL (ref 0.50–1.10)
GLUCOSE: 112 mg/dL — AB (ref 70–99)
Potassium: 4 mEq/L (ref 3.5–5.3)
SODIUM: 138 meq/L (ref 135–145)
TOTAL PROTEIN: 6.8 g/dL (ref 6.0–8.3)
Total Bilirubin: 0.5 mg/dL (ref 0.2–1.2)

## 2014-07-15 LAB — POCT GLYCOSYLATED HEMOGLOBIN (HGB A1C): Hemoglobin A1C: 7.1

## 2014-07-15 MED ORDER — PRAVASTATIN SODIUM 20 MG PO TABS: 20.0000 mg | ORAL_TABLET | Freq: Every day | ORAL | Status: DC

## 2014-07-15 MED ORDER — SAXAGLIPTIN-METFORMIN ER 5-500 MG PO TB24: 1.0000 | ORAL_TABLET | Freq: Every day | ORAL | Status: DC

## 2014-07-15 NOTE — Progress Notes (Signed)
  Subjective:   Marissa Oliver is an 73 y.o. female who presents for follow up of Type 2 diabetes mellitus and recheck on chronic issues.   Patient is checking home blood sugars.   Home blood sugar records: 120 to 135 Current symptoms include: none. Patient denies no concerns.  Patient is checking their feet daily. Foot concerns (callous, ulcer, wound, thickened nails, toenail fungus, skin fungus, hammer toe): none Last dilated eye exam within a year  Current treatments: metformin 500mg  daily but causes loose stool. Medication compliance: good  Current diet: well balanced Current exercise: none Known diabetic complications: none  Compliant with pravastatin without complaint.  The following portions of the patient's history were reviewed and updated as appropriate: allergies, current medications, past family history, past medical history, past social history, past surgical history and problem list.  ROS as in subjective above    Objective:   BP 120/70  Pulse 77  Temp(Src) 98 F (36.7 C) (Oral)  Resp 14  Wt 168 lb (76.204 kg) Wt Readings from Last 3 Encounters:  07/15/14 168 lb (76.204 kg)  03/28/14 169 lb (76.658 kg)  02/26/14 173 lb (78.472 kg)   Gen: wd, wn, nad Lungs: clear Heart: RRR, normal s1, s2, no murmurs Ext: no edema Pluses normal   Assessment:   Encounter Diagnoses  Name Primary?  . Type II or unspecified type diabetes mellitus without mention of complication, not stated as uncontrolled Yes  . Dyslipidemia   . Obesity   . Adverse effect of procoagulant medication, initial encounter      Plan:   I reviewed the interim history which includes her visit with gynecology and cardiology  Diabetes type 2-hemoglobin A1c 7.1% today, a little improved compared to last time.  She is not tolerating metformin 500 mg even at once daily due to diarrhea .  Changed to Kombiglyze XR 5/500 mg daily samples and prescription given. Continue efforts at healthy diet,  daily foot checks, yearly eye Dr. visit, and it sounds like she is eating a little healthier at this point  Dyslipidemia-last lipid profile was at goal, continue current medication  Obesity-she has lost a little weight since last visit, continue efforts to lose weight  Metformin intolerance noted in allergies.  She'll return in October for high dose influenza vaccine  Labs today, we will call lab results.

## 2014-07-15 NOTE — Telephone Encounter (Signed)
Please track down her last eye Dr. visit, and if it has been more than one year she needs an updated eye Dr. Visit  Please remind her to take an 81 mg baby aspirin daily for heart health as well as vitamin D 1000 units daily

## 2014-07-16 LAB — MICROALBUMIN / CREATININE URINE RATIO
CREATININE, URINE: 84.7 mg/dL
MICROALB UR: 0.5 mg/dL (ref 0.00–1.89)
MICROALB/CREAT RATIO: 5.9 mg/g (ref 0.0–30.0)

## 2014-07-16 NOTE — Telephone Encounter (Signed)
Patient is aware to take her Vitamin D and her 81 mg of Asa. CLS

## 2014-07-17 ENCOUNTER — Telehealth: Payer: Self-pay | Admitting: Family Medicine

## 2014-07-17 ENCOUNTER — Other Ambulatory Visit: Payer: Self-pay | Admitting: Medical

## 2014-07-17 MED ORDER — GLIPIZIDE 5 MG PO TABS: 5.0000 mg | ORAL_TABLET | Freq: Two times a day (BID) | ORAL | Status: DC

## 2014-07-17 MED ORDER — METFORMIN HCL ER 500 MG PO TB24: 500.0000 mg | ORAL_TABLET | Freq: Every day | ORAL | Status: DC

## 2014-07-17 NOTE — Telephone Encounter (Signed)
Did she use the coupon card?

## 2014-07-17 NOTE — Telephone Encounter (Signed)
She said no , her insurance doesn't cover this medication at all and she will not be able to pay for this. She wants to stay with the Metformin. CLS

## 2014-07-17 NOTE — Telephone Encounter (Signed)
Patient is aware of [facility] message about the medications. CLS

## 2014-07-17 NOTE — Telephone Encounter (Signed)
Patient called and said that the new DM medication is not covered under her insurance it is going to cost her $450 for a 90 day supply and she can't afford that. She said she will continue to take the metformin and she will take it like she is suppose to and she will diet. She said she took the metformin this morning and she didn't have diarrhea. She said she needs a refill sent to [facility] on the Metformin. 90 day supply please.

## 2014-07-17 NOTE — Telephone Encounter (Signed)
First of all, if she did not try to use the coupon card, of course it will be expensive  If whatever reason if that doesn't work, then have her change to the following that I sent to the pharmacy  Glucophage XR 500 once daily which is the extended release metformin  Begin glipizide twice daily we should be cheap generic/ this also is for diabetes  Recheck in 65mo

## 2014-07-24 ENCOUNTER — Telehealth: Payer: Self-pay

## 2014-07-24 NOTE — Telephone Encounter (Signed)
PATIENT CALLED Monday MORNING REPORTED THAT THE NEW MED YOU GAVE HER MADE BLOOD SUGAR TO LOW SHE HAD BEEN GETTING READINGS OF 54 AND BELOW SO I TOLD HER WITH JCL IN STATION WITH ME TO STOP THE NEW MED AND YOU WOULD BE BACK Monday AND I WOULD SEND YOU THE MESSAGE PATIENT AGREED

## 2014-07-28 NOTE — Telephone Encounter (Signed)
Have her just take the Glucophage 500mg  XR once daily and hold off on Glipizide for now.  The Glucophage will not cause low readings.  We may need to add back Glipizide if sugars staying over 130 in the mornings.  Call back in 2 wk with update on glucose readings.

## 2014-07-29 NOTE — Telephone Encounter (Signed)
Patient is aware of the message from [facility]. CLS

## 2014-09-02 ENCOUNTER — Encounter: Payer: Self-pay | Admitting: Medical

## 2014-10-08 LAB — HM DIABETES EYE EXAM

## 2014-10-09 ENCOUNTER — Encounter: Payer: Self-pay | Admitting: Internal Medicine

## 2014-10-15 ENCOUNTER — Ambulatory Visit (INDEPENDENT_AMBULATORY_CARE_PROVIDER_SITE_OTHER): Payer: Medicare HMO | Admitting: Medical

## 2014-10-15 ENCOUNTER — Encounter: Payer: Self-pay | Admitting: Medical

## 2014-10-15 ENCOUNTER — Telehealth: Payer: Self-pay | Admitting: Family Medicine

## 2014-10-15 ENCOUNTER — Other Ambulatory Visit: Payer: Self-pay | Admitting: Family Medicine

## 2014-10-15 VITALS — BP 132/80 | HR 66 | Temp 97.7°F | Resp 15 | Wt 168.0 lb

## 2014-10-15 DIAGNOSIS — M8580 Other specified disorders of bone density and structure, unspecified site: Secondary | ICD-10-CM

## 2014-10-15 DIAGNOSIS — E559 Vitamin D deficiency, unspecified: Secondary | ICD-10-CM | POA: Insufficient documentation

## 2014-10-15 DIAGNOSIS — F411 Generalized anxiety disorder: Secondary | ICD-10-CM

## 2014-10-15 DIAGNOSIS — E119 Type 2 diabetes mellitus without complications: Secondary | ICD-10-CM

## 2014-10-15 LAB — POCT GLYCOSYLATED HEMOGLOBIN (HGB A1C): Hemoglobin A1C: 6.5

## 2014-10-15 MED ORDER — PRAVASTATIN SODIUM 20 MG PO TABS: 20.0000 mg | ORAL_TABLET | Freq: Every day | ORAL | Status: DC

## 2014-10-15 MED ORDER — PAROXETINE HCL 20 MG PO TABS: ORAL_TABLET | ORAL | Status: DC

## 2014-10-15 MED ORDER — ALENDRONATE SODIUM 70 MG PO TABS: 70.0000 mg | ORAL_TABLET | ORAL | Status: DC

## 2014-10-15 NOTE — Telephone Encounter (Signed)
Patient called and said her medications was sent to the wrong pharmacy. They should have went to the mail order pharmacy. I called and cancelled [facility] order and resent to [place] mail order. Per the patients request

## 2014-10-15 NOTE — Progress Notes (Signed)
  Subjective:   Marissa Oliver is an 73 y.o. female who presents for follow up of Type 2 diabetes mellitus, anxiety, and vit d deficiency.    Diabetes type II -  Patient is not checking home blood sugars.   Home blood sugar records: patient does not check sugars Current symptoms include: none. Patient denies none.  Patient is checking their feet daily. Foot concerns (callous, ulcer, wound, thickened nails, toenail fungus, skin fungus, hammer toe): no concerns Last dilated eye exam  09/2014 Current treatments: didn't tolerate glipizide due to low sugar.   Taking the metformin XR.   Kombiglyze too expensive.   Medication compliance: good  Current diet: in general, a "healthy" diet   Current exercise: some not much Known diabetic complications: none  Anxiety - Having a lot of nerve problems.  Her and brother don't get along to well, but they live together.  When they discuss things or try to talk, he blows up and gets upset.   She got scammed out of $400 recently, and just has a lot going on.    Vitamin D deficiency - compliant with prescription Vit D.   Takes medication I prescribed prior for osteopenia, but can't recall the name.   The following portions of the patient's history were reviewed and updated as appropriate: allergies, current medications, past family history, past medical history, past social history, past surgical history and problem list.  ROS as in subjective above    Objective:   Gen: wd, wn, nad Heart: RRR, normal s1, s2, no murmurs Lungs clear Pulses normal Ext: no edema Skin: warm, dry  Assessment:   Encounter Diagnoses  Name Primary?  . Diabetes type 2, controlled Yes  . Generalized anxiety disorder   . Vitamin D deficiency   . Osteopenia     Plan:   Diabetes type 2 - HgbA1C 6.5% today.  C/t Metformin XR.  C/t healthy diet, get exercise regularly.  F/u March 2016.  Generalized anxiety - discussed her concerns.  Begin Paxil 20mg , 1/2 tablet daily  the first week, then 1 tablet daily  Vit D deficiency - c/t erocalciferol weekly  Osteopenia - c/t Fosamax  Call back in 2-3 weeks to let me know how Paxil is working

## 2014-10-16 ENCOUNTER — Other Ambulatory Visit: Payer: Self-pay | Admitting: Family Medicine

## 2014-10-16 MED ORDER — METFORMIN HCL ER 500 MG PO TB24: 500.0000 mg | ORAL_TABLET | Freq: Every day | ORAL | Status: DC

## 2015-01-15 ENCOUNTER — Ambulatory Visit (INDEPENDENT_AMBULATORY_CARE_PROVIDER_SITE_OTHER): Payer: Medicare HMO | Admitting: Medical

## 2015-01-15 ENCOUNTER — Encounter: Payer: Self-pay | Admitting: Medical

## 2015-01-15 VITALS — BP 112/80 | HR 73 | Resp 14 | Wt 169.0 lb

## 2015-01-15 DIAGNOSIS — E559 Vitamin D deficiency, unspecified: Secondary | ICD-10-CM | POA: Diagnosis not present

## 2015-01-15 DIAGNOSIS — E119 Type 2 diabetes mellitus without complications: Secondary | ICD-10-CM | POA: Diagnosis not present

## 2015-01-15 DIAGNOSIS — Z720 Tobacco use: Secondary | ICD-10-CM | POA: Diagnosis not present

## 2015-01-15 DIAGNOSIS — F17200 Nicotine dependence, unspecified, uncomplicated: Secondary | ICD-10-CM

## 2015-01-15 DIAGNOSIS — M8580 Other specified disorders of bone density and structure, unspecified site: Secondary | ICD-10-CM | POA: Diagnosis not present

## 2015-01-15 DIAGNOSIS — E785 Hyperlipidemia, unspecified: Secondary | ICD-10-CM | POA: Diagnosis not present

## 2015-01-15 LAB — CBC
HEMATOCRIT: 41.4 % (ref 36.0–46.0)
Hemoglobin: 14 g/dL (ref 12.0–15.0)
MCH: 29.3 pg (ref 26.0–34.0)
MCHC: 33.8 g/dL (ref 30.0–36.0)
MCV: 86.6 fL (ref 78.0–100.0)
MPV: 10.2 fL (ref 8.6–12.4)
Platelets: 173 10*3/uL (ref 150–400)
RBC: 4.78 MIL/uL (ref 3.87–5.11)
RDW: 13.9 % (ref 11.5–15.5)
WBC: 4.7 10*3/uL (ref 4.0–10.5)

## 2015-01-15 LAB — ALT: ALT: 16 U/L (ref 0–35)

## 2015-01-15 MED ORDER — ALENDRONATE SODIUM 70 MG PO TABS: 70.0000 mg | ORAL_TABLET | ORAL | Status: DC

## 2015-01-15 MED ORDER — ERGOCALCIFEROL 1.25 MG (50000 UT) PO CAPS: 50000.0000 [IU] | ORAL_CAPSULE | ORAL | Status: DC

## 2015-01-15 MED ORDER — LISINOPRIL 5 MG PO TABS: 5.0000 mg | ORAL_TABLET | Freq: Every day | ORAL | Status: DC

## 2015-01-15 MED ORDER — PRAVASTATIN SODIUM 20 MG PO TABS: 20.0000 mg | ORAL_TABLET | Freq: Every day | ORAL | Status: DC

## 2015-01-15 MED ORDER — METFORMIN HCL ER 500 MG PO TB24: 500.0000 mg | ORAL_TABLET | Freq: Every day | ORAL | Status: DC

## 2015-01-15 MED ORDER — PAROXETINE HCL 20 MG PO TABS: ORAL_TABLET | ORAL | Status: DC

## 2015-01-15 NOTE — Progress Notes (Signed)
  Subjective:   Marissa Oliver is an 74 y.o. female who presents for follow up of Type 2 diabetes mellitus and chronic issues.   Patient is not checking home blood sugars.   Home blood sugar records: patient does not check sugars Current symptoms include: none. Patient denies no concerns.  Patient is checking their feet daily. Foot concerns (callous, ulcer, wound, thickened nails, toenail fungus, skin fungus, hammer toe): no concerns  Last dilated eye exam 11/2014  Current treatments: metformin xr daily Medication compliance: good  Current diet: eating okay....she said she will start eating better Current exercise: sometimes Known diabetic complications: none  Compliant with Fosamax, ergocalciferol, Pravachol, Paxil daily without complaint. Mood is fine.  The following portions of the patient's history were reviewed and updated as appropriate: allergies, current medications, past family history, past medical history, past social history, past surgical history and problem list.  ROS as in subjective above    Objective:   BP 112/80 mmHg  Pulse 73  Resp 14  Wt 169 lb (76.658 kg)  General appearence: alert, no distress, WD/WN  Oral cavity: MMM, no lesions Neck: supple, no lymphadenopathy, no thyromegaly, no masses Heart: RRR, normal S1, S2, no murmurs Lungs: CTA bilaterally, no wheezes, rhonchi, or rales Abdomen: +bs, soft, non tender, non distended, no masses, no hepatomegaly, no splenomegaly Pulses: 2+ symmetric, upper and lower extremities, normal cap refill   Assessment:   Encounter Diagnoses  Name Primary?  . Diabetes type 2, controlled Yes  . Dyslipidemia   . Vitamin D deficiency   . Osteopenia   . Smoker      Plan:   DM type 2 - hemoglobin A1c 6.8% today, continue metformin xR 500 mg daily, continue healthy diabetic diet, eye Dr. visit yearly, daily foot checks. Add lisinopril daily for renal protection Dyslipidemia-labs today, continue current  medication Vitamin D deficiency-continue ergocalciferol once weekly Osteopenia-continue Fosamax weekly Smoker-advise she quit completely although she has cut down She has follow-up planned for colonoscopy in October 2016 due to polyps

## 2015-01-16 LAB — VITAMIN D 25 HYDROXY (VIT D DEFICIENCY, FRACTURES): Vit D, 25-Hydroxy: 33 ng/mL (ref 30–100)

## 2015-01-16 NOTE — Progress Notes (Signed)
LM to [facility] 

## 2015-04-16 ENCOUNTER — Telehealth: Payer: Self-pay

## 2015-04-16 NOTE — Telephone Encounter (Signed)
Patient said last year Dr. Moshe Salisbury prescribed cream for her for vaginal dryness. She is having a problem with it now and it has become painful.  She at first indicated she might want to get Estradiol cream again but upon discussion she is not sure what the pain is from. I asked her would she like office visit to be examined. She said she would like that. Claudia picked up on her to schedule appt.

## 2015-04-18 ENCOUNTER — Ambulatory Visit (INDEPENDENT_AMBULATORY_CARE_PROVIDER_SITE_OTHER): Payer: Medicare HMO | Admitting: Gynecology

## 2015-04-18 ENCOUNTER — Encounter: Payer: Self-pay | Admitting: Gynecology

## 2015-04-18 VITALS — BP 132/76

## 2015-04-18 DIAGNOSIS — R3989 Other symptoms and signs involving the genitourinary system: Secondary | ICD-10-CM

## 2015-04-18 DIAGNOSIS — N3281 Overactive bladder: Secondary | ICD-10-CM | POA: Diagnosis not present

## 2015-04-18 DIAGNOSIS — N952 Postmenopausal atrophic vaginitis: Secondary | ICD-10-CM | POA: Diagnosis not present

## 2015-04-18 DIAGNOSIS — Z01419 Encounter for gynecological examination (general) (routine) without abnormal findings: Secondary | ICD-10-CM | POA: Diagnosis not present

## 2015-04-18 DIAGNOSIS — N3000 Acute cystitis without hematuria: Secondary | ICD-10-CM | POA: Diagnosis not present

## 2015-04-18 DIAGNOSIS — N393 Stress incontinence (female) (male): Secondary | ICD-10-CM

## 2015-04-18 DIAGNOSIS — M8580 Other specified disorders of bone density and structure, unspecified site: Secondary | ICD-10-CM | POA: Diagnosis not present

## 2015-04-18 LAB — URINALYSIS W MICROSCOPIC + REFLEX CULTURE
BILIRUBIN URINE: NEGATIVE
CRYSTALS: NONE SEEN
Casts: NONE SEEN
Glucose, UA: NEGATIVE mg/dL
Ketones, ur: NEGATIVE mg/dL
Nitrite: NEGATIVE
PROTEIN: NEGATIVE mg/dL
Specific Gravity, Urine: 1.015 (ref 1.005–1.030)
Urobilinogen, UA: 0.2 mg/dL (ref 0.0–1.0)
pH: 7 (ref 5.0–8.0)

## 2015-04-18 MED ORDER — NITROFURANTOIN MONOHYD MACRO 100 MG PO CAPS: 100.0000 mg | ORAL_CAPSULE | Freq: Two times a day (BID) | ORAL | Status: DC

## 2015-04-18 MED ORDER — ESTRADIOL 10 MCG VA TABS: 1.0000 | ORAL_TABLET | VAGINAL | Status: DC

## 2015-04-18 NOTE — Progress Notes (Signed)
Marissa Oliver 04-Aug-1941 [id number]   History:    74 y.o.  for annual gyn exam who was complaining burning in the vagina along with urinary frequency. She is currently being followed by Dr. Karsten Ro for overactive bladder and is in the process of having percutaneous tibial nerve stimulation placed. He recently gave her samples of peridium that she is taking daily because of her bladder spasms. She denies any fever, chills, nausea, vomiting or back pain. 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 Patient has a history of total abdominal hysterectomy for benign entities many years ago. She does smoke a pack a cigarettes every 3 days despite having been counseled in the past. She stated she had a benign colon polyp removed 5 years ago and needs follow-up. She denies any prior history of any abnormal Pap smear the last was in 2015. I reviewed her last bone density study in 2015. Her PCP Dr. Redmond School has been treating her for osteopenia. Her bone density in 2015 demonstrated that the lowest T score was -2.3 at the AP spine with 3.4% decrease in bone mineralization when compared with previous study. She is currently on Fosamax 70 mg every weekly.  Past medical history,surgical history, family history and social history were all reviewed and documented in the EPIC chart.  Gynecologic History No LMP recorded. Patient is postmenopausal. Contraception: status post hysterectomy Last Pap: 2015. Results were: normal Last mammogram: 2015. Results were: normal  Obstetric History OB History  Gravida Para Term Preterm AB SAB TAB Ectopic Multiple Living  3 3        3     # Outcome Date GA Lbr Len/2nd Weight Sex Delivery Anes PTL Lv  3 Para           2 Para           1 Para                ROS: A ROS was performed and pertinent positives and negatives are included in the history.  GENERAL: No fevers or  chills. HEENT: No change in vision, no earache, sore throat or sinus congestion. NECK: No pain or stiffness. CARDIOVASCULAR: No chest pain or pressure. No palpitations. PULMONARY: No shortness of breath, cough or wheeze. GASTROINTESTINAL: No abdominal pain, nausea, vomiting or diarrhea, melena or bright red blood per rectum. GENITOURINARY: No urinary frequency, urgency, hesitancy or dysuria. MUSCULOSKELETAL: No joint or muscle pain, no back pain, no recent trauma. DERMATOLOGIC: No rash, no itching, no lesions. ENDOCRINE: No polyuria, polydipsia, no heat or cold intolerance. No recent change in weight. HEMATOLOGICAL: No anemia or easy bruising or bleeding. NEUROLOGIC: No headache, seizures, numbness, tingling or weakness. PSYCHIATRIC: No depression, no loss of interest in normal activity or change in sleep pattern.     Exam: chaperone present  BP 132/76 mmHg  There is no weight on file to calculate BMI.  General appearance : Well developed well nourished female. No acute distress HEENT: Eyes: no retinal hemorrhage or exudates,  Neck supple, trachea midline, no carotid bruits, no thyroidmegaly Lungs: Clear to auscultation, no rhonchi or wheezes, or rib retractions  Heart: Regular rate and rhythm, no murmurs or gallops Breast:Examined in sitting and supine position were symmetrical in appearance, no palpable masses or tenderness,  no skin retraction, no nipple inversion, no nipple discharge, no skin discoloration, no axillary or supraclavicular lymphadenopathy Abdomen:  no palpable masses or tenderness, no rebound or guarding Extremities: no edema or skin discoloration or tenderness  Pelvic:  Bartholin, Urethra, Skene Glands: Within normal limits             Vagina: No gross lesions or discharge, vaginal atrophy  Cervix: Absent  Uterus  absent  Adnexa  Without masses or tenderness  Anus and perineum  normal   Rectovaginal  normal sphincter tone without palpated masses or tenderness              Hemoccult PCP provides   Urinalysis: 11-20 WBC, 0-2 RBC, many bacteria  Assessment/Plan:  74 y.o. female for annual exam with clinical evidence of urinary tract infection she will be prescribed Macrobid one by mouth twice a day for 7 days. She should continue to take her peridium once weekly. She was instructed to follow-up with her urologist as previously had been recommended by him. We counseled her once again on the detrimental effects of smoking. She is taking her calcium and vitamin D. She needs to discuss with her primary care physician about a possible drug holiday since she's been on Fosamax for several years. We discussed the potential risk such as osteonecrosis of the jaw or spontaneous subtrochanteric fractures. She also needs to check with him about her next colonoscopy. Pap smears are no longer indicated based on her age and the new guidelines. I have given her prescription for Vagifem 10 g to apply intravaginally twice a week for vaginal atrophy and irritation and discomfort. Her PCP has been doing her blood work.   Terrance Mass MD, 11:57 AM 04/18/2015

## 2015-04-18 NOTE — Patient Instructions (Signed)
Interstitial Cystitis Interstitial cystitis (IC) is a condition that results in discomfort or pain in the bladder and the surrounding pelvic region. The symptoms can be different from case to case and even in the same individual. People may experience:  Mild discomfort.  Pressure.  Tenderness.  Intense pain in the bladder and pelvic area. CAUSES  Because IC varies so much in symptoms and severity, people studying this disease believe it is not one but several diseases. Some caregivers use the term painful bladder syndrome (PBS) to describe cases with painful urinary symptoms. This may not meet the strictest definition of IC. The term IC / PBS includes all cases of urinary pain that cannot be connected to other causes, such as infection or urinary stones.  SYMPTOMS  Symptoms may include:  An urgent need to urinate.  A frequent need to urinate.  A combination of these symptoms. Pain may change in intensity as the bladder fills with urine or as it empties. Women's symptoms often get worse during menstruation. They may sometimes experience pain with vaginal intercourse. Some of the symptoms of IC / PBS seem like those of bacterial infection. Tests do not show infection. IC / PBS is far more common in women than in men.  DIAGNOSIS  The diagnosis of IC / PBS is based on:  Presence of pain related to the bladder, usually along with problems of frequency and urgency.  Not finding other diseases that could cause the symptoms.  Diagnostic tests that help rule out other diseases include:  Urinalysis.  Urine culture.  Cystoscopy.  Biopsy of the bladder wall.  Distension of the bladder under anesthesia.  Urine cytology.  Laboratory examination of prostate secretions. A biopsy is a tissue sample that can be looked at under a microscope. Samples of the bladder and urethra may be removed during a cystoscopy. A biopsy helps rule out bladder cancer. TREATMENT  Scientists have not yet  found a cure for IC / PBS. Patients with IC / PBS do not get better with antibiotic therapy. Caregivers cannot predict who will respond best to which treatment. Symptoms may disappear without explanation. Disappearing symptoms may coincide with an event such as a change in diet or treatment. Even when symptoms disappear, they may return after days, weeks, months, or years.  Because the causes of IC / PBS are unknown, current treatments are aimed at relieving symptoms. Many people are helped by one or a combination of the treatments. As researchers learn more about IC / PBS, the list of potential treatments will change. Patients should discuss their options with a caregiver. SURGERY  Surgery should be considered only if all available treatments have failed and the pain is disabling. Many approaches and techniques are used. Each approach has its own advantages and complications. Advantages and complications should be discussed with a urologist. Your caregiver may recommend consulting another urologist for a second opinion. Most caregivers are reluctant to operate because the outcome is unpredictable. Some people still have symptoms after surgery.  People considering surgery should discuss the potential risks and benefits, side effects, and long- and short-term complications with their family, as well as with people who have already had the procedure. Surgery requires anesthesia, hospitalization, and in some cases weeks or months of recovery. As the complexity of the procedure increases, so do the chances for complications and for failure. HOME CARE INSTRUCTIONS   All drugs, even those sold over the counter, have side effects. Patients should always consult a caregiver before using any  drug for an extended amount of time. Only take over-the-counter or prescription medicines for pain, discomfort, or fever as directed by your caregiver.  Many patients feel that smoking makes their symptoms worse. How the  by-products of tobacco that are excreted in the urine affect IC / PBS is unknown. Smoking is the major known cause of bladder cancer. One of the best things smokers can do for their bladder and their overall health is to quit.  Many patients feel that gentle stretching exercises help relieve IC / PBS symptoms.  Methods vary, but basically patients decide to empty their bladder at designated times and use relaxation techniques and distractions to keep to the schedule. Gradually, patients try to lengthen the time between scheduled voids. A diary in which to record voiding times is usually helpful in keeping track of progress. MAKE SURE YOU:   Understand these instructions.  Will watch your condition.  Will get help right away if you are not doing well or get worse. Document Released: 06/18/2004 Document Revised: 01/10/2012 Document Reviewed: 09/02/2008 [facility] Patient Information 2015 [place], [place]. This information is not intended to replace advice given to you by your health care provider. Make sure you discuss any questions you have with your health care provider. Estradiol vaginal tablets What is this medicine? ESTRADIOL (es tra DYE ole) vaginal tablet is used to help relieve symptoms of vaginal irritation and dryness that occurs in some women during menopause. This medicine may be used for other purposes; ask your health care provider or pharmacist if you have questions. COMMON BRAND NAME(S): Vagifem What should I tell my health care provider before I take this medicine? They need to know if you have any of these conditions: -abnormal vaginal bleeding -blood vessel disease or blood clots -breast, cervical, endometrial, ovarian, liver, or uterine cancer -dementia -diabetes -gallbladder disease -heart disease or recent heart attack -high blood pressure -high cholesterol -high level of calcium in the blood -hysterectomy -kidney disease -liver disease -migraine headaches -protein  C deficiency -protein S deficiency -stroke -systemic lupus erythematosus (SLE) -tobacco smoker -an unusual or allergic reaction to estrogens, other hormones, medicines, foods, dyes, or preservatives -pregnant or trying to get pregnant -breast-feeding How should I use this medicine? This medicine is only for use in the vagina. Do not take by mouth. Wash your hands before and after use. Read package directions carefully. Unwrap the pre-filled applicator package. Lie on your back, part and bend your knees. Gently insert the applicator tip high in the vagina and push the plunger to release the tablet into the vagina. Gently remove the applicator. Throw away the applicator after use. Do not use your medicine more often than directed. Finish the full course prescribed by your doctor or health care professional even if you think your condition is better. Do not stop using except on the advice of your doctor or health care professional. Talk to your pediatrician regarding the use of this medicine in children. A patient package insert for the product will be given with each prescription and refill. Read this sheet carefully each time. The sheet may change frequently. Overdosage: If you think you have taken too much of this medicine contact a poison control center or emergency room at once. NOTE: This medicine is only for you. Do not share this medicine with others. What if I miss a dose? If you miss a dose, take it as soon as you can. If it is almost time for your next dose, take only that dose. Do  not take double or extra doses. What may interact with this medicine? Do not take this medicine with any of the following medications: -aromatase inhibitors like aminoglutethimide, anastrozole, exemestane, letrozole, testolactone This medicine may also interact with the following medications: -antibiotics used to treat tuberculosis like rifabutin, rifampin and rifapentene -raloxifene or  tamoxifen -warfarin This list may not describe all possible interactions. Give your health care provider a list of all the medicines, herbs, non-prescription drugs, or dietary supplements you use. Also tell them if you smoke, drink alcohol, or use illegal drugs. Some items may interact with your medicine. What should I watch for while using this medicine? Visit your health care professional for regular checks on your progress. You will need a regular breast and pelvic exam. You should also discuss the need for regular mammograms with your health care professional, and follow his or her guidelines. This medicine can make your body retain fluid, making your fingers, hands, or ankles swell. Your blood pressure can go up. Contact your doctor or health care professional if you feel you are retaining fluid. If you have any reason to think you are pregnant; stop taking this medicine at once and contact your doctor or health care professional. Tobacco smoking increases the risk of getting a blood clot or having a stroke, especially if you are more than 76 years old. You are strongly advised not to smoke. If you wear contact lenses and notice visual changes, or if the lenses begin to feel uncomfortable, consult your eye care specialist. If you are going to have elective surgery, you may need to stop taking this medicine beforehand. Consult your health care professional for advice prior to scheduling the surgery. What side effects may I notice from receiving this medicine? Side effects that you should report to your doctor or health care professional as soon as possible: -allergic reactions like skin rash, itching or hives, swelling of the face, lips, or tongue -breast tissue changes or discharge -changes in vision -chest pain -confusion, trouble speaking or understanding -dark urine -general ill feeling or flu-like symptoms -light-colored stools -nausea, vomiting -pain, swelling, warmth in the  leg -right upper belly pain -severe headaches -shortness of breath -sudden numbness or weakness of the face, arm or leg -trouble walking, dizziness, loss of balance or coordination -unusual vaginal bleeding -yellowing of the eyes or skin Side effects that usually do not require medical attention (report to your doctor or health care professional if they continue or are bothersome): -hair loss -increased hunger or thirst -increased urination -symptoms of vaginal infection like itching, irritation or unusual discharge -unusually weak or tired This list may not describe all possible side effects. Call your doctor for medical advice about side effects. You may report side effects to FDA at 1-800-FDA-1088. Where should I keep my medicine? Keep out of the reach of children. Store at room temperature between 15 and 30 degrees C (59 and 86 degrees F). Throw away any unused medicine after the expiration date. NOTE: This sheet is a summary. It may not cover all possible information. If you have questions about this medicine, talk to your doctor, pharmacist, or health care provider.  2015, [employer]. (2011-01-20 09:08:58)

## 2015-04-21 LAB — URINE CULTURE

## 2015-04-28 ENCOUNTER — Encounter: Payer: Self-pay | Admitting: Medical

## 2015-04-28 ENCOUNTER — Ambulatory Visit (INDEPENDENT_AMBULATORY_CARE_PROVIDER_SITE_OTHER): Payer: Medicare HMO | Admitting: Medical

## 2015-04-28 VITALS — BP 130/70 | HR 72 | Temp 97.8°F | Resp 14 | Wt 169.0 lb

## 2015-04-28 DIAGNOSIS — N952 Postmenopausal atrophic vaginitis: Secondary | ICD-10-CM

## 2015-04-28 DIAGNOSIS — N390 Urinary tract infection, site not specified: Secondary | ICD-10-CM

## 2015-04-28 DIAGNOSIS — M8580 Other specified disorders of bone density and structure, unspecified site: Secondary | ICD-10-CM

## 2015-04-28 DIAGNOSIS — R300 Dysuria: Secondary | ICD-10-CM

## 2015-04-28 DIAGNOSIS — N3281 Overactive bladder: Secondary | ICD-10-CM

## 2015-04-28 MED ORDER — CIPROFLOXACIN HCL 500 MG PO TABS: 500.0000 mg | ORAL_TABLET | Freq: Two times a day (BID) | ORAL | Status: DC

## 2015-04-28 MED ORDER — MIRABEGRON ER 25 MG PO TB24: 25.0000 mg | ORAL_TABLET | Freq: Every day | ORAL | Status: DC

## 2015-04-29 ENCOUNTER — Encounter: Payer: Self-pay | Admitting: Medical

## 2015-04-29 NOTE — Progress Notes (Signed)
Subjective:  Marissa Oliver is a 74 y.o. female who complains of ongoing urinary tract infection.  She saw gynecology, Dr. Toney Rakes recently for physical and UTI symptoms.   Has completed a course of Macrobid, but still c/o urinary discomfort, this morning had cloudy urine, still has pelvic pressure.   She recently started vaginal tablet for atrophic vaginitis.   She denies fever, back pain, nausea, vomiting, vaginal discharge, chills.   She did see urology in the past year or so for recurrent UTI, had cystoscopy, took medication briefly for OAB. No other aggravating or relieving factors.  No other c/o.  Past Medical History  Diagnosis Date  . Dyslipidemia   . Diabetes mellitus 2005  . Smoker   . Hemorrhoids   . OAB (overactive bladder)   . GAD (generalized anxiety disorder)   . Diverticulosis   . Colon polyp     tubular adenoma (colonoscopy q5 yrs)  . Atrophic vaginitis     estrace cream rx'd by Dr. Karsten Ro  . Osteopenia 11/2013     Bone Density, loss of bone density compared to prior exam 2012  . Psoriasis     mild, sees [facility]    ROS as in subjective  Reviewed allergies, medications, past medical, surgical, and social history.    Objective: Filed Vitals:   04/28/15 1341  BP: 130/70  Pulse: 72  Temp: 97.8 F (36.6 C)  Resp: 14    General appearance: alert, no distress, WD/WN, female Abdomen: +bs, soft, non tender, non distended, no masses, no hepatomegaly, no splenomegaly, no bruits Back: no CVA tenderness GU: deferred     Assessment: Encounter Diagnoses  Name Primary?  . Dysuria Yes  . Urinary tract infection without hematuria, site unspecified   . Atrophic vaginitis   . Osteopenia   . OAB (overactive bladder)      Plan: I reviewed her recent physical notes with Dr. Toney Rakes, recent UA and culture as well as today's UA.   She has finished macrobid.  Begin 3-5 days of Cipro as it seems she is improving but not completely cleared of UTI symptoms.   Reviewed prior several cultures.   Advised she restarted medication for OAB as it did help prior.   C/t the new hormone tablet vaginally she just started per gynecology for atrophic vaginitis .    After reviewing notes from Dr. Toney Rakes, there was concern about taking a drug holiday from [place], however after reviewing her records, she was started on this in 2013, and there have been periods of months where she wasn't compliant.   Thus, she may have been on this 2.5-3 years total at best.   C/t Fosamax for now.    Call or return if worse or not improving.  Return soon for routine diabetes f/u.

## 2015-05-16 ENCOUNTER — Encounter: Payer: Medicare HMO | Admitting: Gynecology

## 2015-07-11 ENCOUNTER — Other Ambulatory Visit: Payer: Self-pay | Admitting: Family Medicine

## 2015-07-11 NOTE — Telephone Encounter (Signed)
Is this okay to refill? 

## 2015-07-23 ENCOUNTER — Encounter: Payer: Medicare HMO | Admitting: Medical

## 2015-08-04 ENCOUNTER — Encounter: Payer: Self-pay | Admitting: Medical

## 2015-08-04 ENCOUNTER — Ambulatory Visit (INDEPENDENT_AMBULATORY_CARE_PROVIDER_SITE_OTHER): Payer: Medicare HMO | Admitting: Medical

## 2015-08-04 VITALS — BP 118/62 | HR 64 | Temp 98.0°F | Wt 171.0 lb

## 2015-08-04 DIAGNOSIS — E119 Type 2 diabetes mellitus without complications: Secondary | ICD-10-CM | POA: Diagnosis not present

## 2015-08-04 DIAGNOSIS — I10 Essential (primary) hypertension: Secondary | ICD-10-CM | POA: Insufficient documentation

## 2015-08-04 DIAGNOSIS — R69 Illness, unspecified: Secondary | ICD-10-CM | POA: Diagnosis not present

## 2015-08-04 DIAGNOSIS — E785 Hyperlipidemia, unspecified: Secondary | ICD-10-CM | POA: Diagnosis not present

## 2015-08-04 DIAGNOSIS — F17200 Nicotine dependence, unspecified, uncomplicated: Secondary | ICD-10-CM | POA: Diagnosis not present

## 2015-08-04 DIAGNOSIS — Z23 Encounter for immunization: Secondary | ICD-10-CM

## 2015-08-04 NOTE — Progress Notes (Signed)
Subjective:   Marissa Oliver is an 74 y.o. female who presents for follow up of Type 2 diabetes mellitus and chronic issues.   Patient is not checking home blood sugars.   Home blood sugar records: patient does not check sugars Current symptoms include: none. Patient denies no concerns.  Patient is checking their feet daily. Foot concerns (callous, ulcer, wound, thickened nails, toenail fungus, skin fungus, hammer toe): no concerns  Last dilated eye exam 11/2014  Current treatments: metformin xr daily Medication compliance: good  Current diet: somewhat healthy Current exercise: sometimes Known diabetic complications: none  Compliant with Fosamax, ergocalciferol, Pravachol, Paxil daily without complaint. Mood is fine.  She enjoys smoking, has no plans to quit.  The following portions of the patient's history were reviewed and updated as appropriate: allergies, current medications, past family history, past medical history, past social history, past surgical history and problem list.  ROS as in subjective above    Objective:   BP 118/62 mmHg  Pulse 64  Temp(Src) 98 F (36.7 C)  Wt 171 lb (77.565 kg)  General appearance: alert, no distress, WD/WN  Oral cavity: MMM, no lesions Neck: supple, no lymphadenopathy, no thyromegaly, no masses Heart: RRR, normal S1, S2, no murmurs Lungs: CTA bilaterally, no wheezes, rhonchi, or rales Abdomen: +bs, soft, non tender, non distended, no masses, no hepatomegaly, no splenomegaly Pulses: 2+ symmetric, upper and lower extremities, normal cap refill See separate foot exam  Diabetic Foot Exam - Simple   Simple Foot Form  Diabetic Foot exam was performed with the following findings:  Yes 08/04/2015  8:51 PM  Visual Inspection  No deformities, no ulcerations, no other skin breakdown bilaterally:  Yes  Sensation Testing  See comments:  Yes  Pulse Check  See comments:  Yes  Comments  1+ pedal pulses.  Monofilament test with few areas of  decrease sensation.           Assessment:   Encounter Diagnoses  Name Primary?  . Controlled type 2 diabetes mellitus without complication, without long-term current use of insulin ([place]) Yes  . Need for prophylactic vaccination and inoculation against influenza   . Dyslipidemia   . Tobacco use disorder      Plan:   DM type 2 -continue metformin xR 500 mg daily, continue healthy diabetic diet, eye Dr. visit yearly, daily foot checks.  Dyslipidemia-labs today, continue current medication She continues to smoke, no plans to quit. Counseled on the influenza virus vaccine.  Vaccine information sheet given.  Influenza vaccine given after consent obtained.  Marissa Oliver was seen today for annual exam.  Diagnoses and all orders for this visit:  Controlled type 2 diabetes mellitus without complication, without long-term current use of insulin (HCC) -     Comprehensive metabolic panel -     Lipid panel -     CBC -     Hemoglobin A1c -     Microalbumin / creatinine urine ratio -     HM DIABETES FOOT EXAM  Need for prophylactic vaccination and inoculation against influenza -     Flu vaccine HIGH DOSE PF (Fluzone High dose) -     Cancel: Flu vaccine HIGH DOSE PF  Dyslipidemia -     Comprehensive metabolic panel -     Lipid panel -     CBC -     Hemoglobin A1c -     Microalbumin / creatinine urine ratio  Tobacco use disorder -     Comprehensive metabolic panel -  Lipid panel -     CBC -     Hemoglobin A1c -     Microalbumin / creatinine urine ratio

## 2015-08-05 ENCOUNTER — Telehealth: Payer: Self-pay

## 2015-08-05 ENCOUNTER — Other Ambulatory Visit: Payer: Self-pay | Admitting: Medical

## 2015-08-05 LAB — CBC
HCT: 40.7 % (ref 36.0–46.0)
Hemoglobin: 13.8 g/dL (ref 12.0–15.0)
MCH: 29.6 pg (ref 26.0–34.0)
MCHC: 33.9 g/dL (ref 30.0–36.0)
MCV: 87.3 fL (ref 78.0–100.0)
MPV: 10.6 fL (ref 8.6–12.4)
PLATELETS: 182 10*3/uL (ref 150–400)
RBC: 4.66 MIL/uL (ref 3.87–5.11)
RDW: 13.7 % (ref 11.5–15.5)
WBC: 5.9 10*3/uL (ref 4.0–10.5)

## 2015-08-05 LAB — MICROALBUMIN / CREATININE URINE RATIO: Creatinine, Urine: 54.2 mg/dL

## 2015-08-05 LAB — LIPID PANEL
CHOL/HDL RATIO: 5.2 ratio — AB (ref <=5.0)
Cholesterol: 208 mg/dL — ABNORMAL HIGH (ref 125–200)
HDL: 40 mg/dL — ABNORMAL LOW (ref >=46)
LDL CALC: 107 mg/dL (ref <130)
Triglycerides: 303 mg/dL — ABNORMAL HIGH (ref <150)
VLDL: 61 mg/dL — AB (ref <30)

## 2015-08-05 LAB — COMPREHENSIVE METABOLIC PANEL
ALK PHOS: 84 U/L (ref 33–130)
ALT: 15 U/L (ref 6–29)
AST: 14 U/L (ref 10–35)
Albumin: 4.6 g/dL (ref 3.6–5.1)
BUN: 14 mg/dL (ref 7–25)
CALCIUM: 9.6 mg/dL (ref 8.6–10.4)
CO2: 25 mmol/L (ref 20–31)
Chloride: 104 mmol/L (ref 98–110)
Creat: 0.7 mg/dL (ref 0.60–0.93)
Glucose, Bld: 115 mg/dL — ABNORMAL HIGH (ref 65–99)
Potassium: 3.8 mmol/L (ref 3.5–5.3)
Sodium: 139 mmol/L (ref 135–146)
TOTAL PROTEIN: 6.9 g/dL (ref 6.1–8.1)
Total Bilirubin: 0.4 mg/dL (ref 0.2–1.2)

## 2015-08-05 LAB — HEMOGLOBIN A1C
HEMOGLOBIN A1C: 7.3 % — AB (ref <5.7)
MEAN PLASMA GLUCOSE: 163 mg/dL — AB (ref <117)

## 2015-08-05 MED ORDER — PRAVASTATIN SODIUM 40 MG PO TABS: 40.0000 mg | ORAL_TABLET | Freq: Every day | ORAL | Status: DC

## 2015-08-05 MED ORDER — GLYBURIDE 5 MG PO TABS: 5.0000 mg | ORAL_TABLET | Freq: Every day | ORAL | Status: DC

## 2015-08-05 NOTE — Telephone Encounter (Signed)
How does she know it won't cover as I just sent it this morning, and glyburide should be dirt cheap.   So its either this or something else added to Metformin

## 2015-08-05 NOTE — Telephone Encounter (Signed)
Pt called saying she was unaware you were going to call in a prescription for Glyburide for her. She says her insurance won't cover it and wants to know if she is supposed to take it or not?

## 2015-08-05 NOTE — Telephone Encounter (Signed)
Pt states that pharmacy called her stating it was not going to be approved. We received a fax today stating a prior authorization is needed and pt wants this to try to get approved first by insurance before another med is sent in. (shane is aware of this conversation as he was standing with me while i spoke with pt about this

## 2015-08-09 DIAGNOSIS — R69 Illness, unspecified: Secondary | ICD-10-CM | POA: Diagnosis not present

## 2015-08-12 ENCOUNTER — Telehealth: Payer: Self-pay | Admitting: Medical

## 2015-08-15 ENCOUNTER — Telehealth: Payer: Self-pay | Admitting: Medical

## 2015-08-15 ENCOUNTER — Other Ambulatory Visit: Payer: Self-pay | Admitting: Medical

## 2015-08-15 NOTE — Telephone Encounter (Signed)
I checked the box that she has tried this in the past.

## 2015-08-15 NOTE — Telephone Encounter (Signed)
Pt called about her RX diabeta said that her insurance is finally going to cover it she went ahead and paid the 40 dollar and they said they would refund her half of it, pt said the pharmacy said they sent [place] a letter for prior authorization on the 11th

## 2015-08-15 NOTE — Telephone Encounter (Signed)
P.A. Isabelle Course pt needs trial of Glimepiride, do you want to switch?

## 2015-08-20 ENCOUNTER — Telehealth: Payer: Self-pay

## 2015-08-20 NOTE — Telephone Encounter (Signed)
Ok. Lets do this.   Stop Glimepiride for now.  Have her check glucose as follows for the next 1-2 weeks and then recheck or send me the readings:  Check fasting daily Check before lunch and dinner AND check 2 hours after lunch and dinner   I need to see these readings to come up with next recommendations.

## 2015-08-20 NOTE — Telephone Encounter (Signed)
Said she was told Mickel Baas would call her back. Never heard anything.

## 2015-08-20 NOTE — Telephone Encounter (Signed)
Pt informed and she wrote all of this down and will call me in 2 weeks

## 2015-08-20 NOTE — Telephone Encounter (Signed)
She said she took her sugar this morning 170 and she took her glimepride and it dropped it down to 90. Took sugar on the phone with me because she felt shakey and it is down to 59. She is unsure if this is ok to drop her sugar so much after each time she takes it. She is very leary of this and she is so nervous she is going to hurt her vital organs. Said that she doesn't think she needs to take it. Unhappy with her charts saying she "complained about the price." She said that if she going to be put on something else she needs the medications names before being prescribed so she can verify with her insurance. She said do not call her cell phone

## 2015-09-02 NOTE — Telephone Encounter (Signed)
Called regarding prior authorization for Glyburide due to no response from last fax.  They state P.A.  was denied, I don't think they recv'd the fax from [facility] prior to denial but once denied must do appeal.   Called appeals department at [phone] and initiated an expedited appeal, takes around 72 hours.

## 2015-09-02 NOTE — Telephone Encounter (Signed)
Recv'd call from [facility]. For Glyburide 5mg  now approved 10/30/14 til 10/31/16 [id number] Audelia Acton do you want to switch her ?

## 2015-09-02 NOTE — Telephone Encounter (Signed)
I had sent a different medication, so if she is taking the other one without c/o, then just stick with that one for now, f/u 71mo

## 2015-09-03 ENCOUNTER — Ambulatory Visit (INDEPENDENT_AMBULATORY_CARE_PROVIDER_SITE_OTHER): Payer: Medicare HMO | Admitting: Medical

## 2015-09-03 ENCOUNTER — Encounter: Payer: Self-pay | Admitting: Medical

## 2015-09-03 VITALS — BP 120/68 | HR 77 | Temp 97.6°F | Wt 170.0 lb

## 2015-09-03 DIAGNOSIS — N3281 Overactive bladder: Secondary | ICD-10-CM

## 2015-09-03 DIAGNOSIS — E119 Type 2 diabetes mellitus without complications: Secondary | ICD-10-CM | POA: Diagnosis not present

## 2015-09-03 DIAGNOSIS — N390 Urinary tract infection, site not specified: Secondary | ICD-10-CM | POA: Diagnosis not present

## 2015-09-03 LAB — POCT URINALYSIS DIPSTICK
BILIRUBIN UA: NEGATIVE
GLUCOSE UA: NEGATIVE
Ketones, UA: NEGATIVE
Protein, UA: NEGATIVE
Spec Grav, UA: 1.015
Urobilinogen, UA: NEGATIVE
pH, UA: 7

## 2015-09-03 MED ORDER — CIPROFLOXACIN HCL 500 MG PO TABS: 500.0000 mg | ORAL_TABLET | Freq: Two times a day (BID) | ORAL | Status: DC

## 2015-09-03 MED ORDER — METFORMIN HCL ER 500 MG PO TB24: 500.0000 mg | ORAL_TABLET | Freq: Every day | ORAL | Status: DC

## 2015-09-03 NOTE — Progress Notes (Signed)
Subjective:  Marissa Oliver is a 74 y.o. female who complains of possible urinary tract infection.  She has had symptoms for 2 days.  Symptoms include burining with urination, odor in urine. Patient denies back pain, fever and vaginal discharge.  Last UTI was 04/2015.   Using nothing for current symptoms but takes medication for OAB.     She notes starting the  Glyburide caused her sugar to drop to 50 one day, shaking so she stopped the medication.   She is only taking Metformin 500mg  XR currently daily.   Since stopping Glyburide, feels fine and she reports morning glucose <130.  She is upset that I mentioned something about her c/o cost of medication.  She notes Glyburide copay was $30.    no other c/o.  Past Medical History  Diagnosis Date  . Dyslipidemia   . Diabetes mellitus 2005  . Smoker   . Hemorrhoids   . OAB (overactive bladder)   . GAD (generalized anxiety disorder)   . Diverticulosis   . Colon polyp     tubular adenoma (colonoscopy q5 yrs)  . Atrophic vaginitis     estrace cream rx'd by Dr. Karsten Ro  . Osteopenia 11/2013     Bone Density, loss of bone density compared to prior exam 2012  . Psoriasis     mild, sees [facility]    ROS as in subjective  Reviewed allergies, medications, past medical, surgical, and social history.    Objective: Filed Vitals:   09/03/15 1022  BP: 120/68  Pulse: 77  Temp: 97.6 F (36.4 C)    General appearance: alert, no distress, WD/WN, female Abdomen: +bs, soft, non tender, non distended, no masses, no hepatomegaly, no splenomegaly, no bruits Back: no CVA tenderness GU: deferred     Assessment: Encounter Diagnoses  Name Primary?  . Urinary tract infection without hematuria, site unspecified Yes  . Overactive bladder   . Controlled type 2 diabetes mellitus without complication, without long-term current use of insulin ([facility])      Plan: Discussed symptoms, diagnosis of UTI, possible complications, and usual course of  illness.  Begin medication Cipro per orders below.  Advised increased water intake, can use OTC Tylenol for pain.      OAB - c/t same medication  Diabetes - I apologized for any miscommunication or concerns about costs.   I advised that in the past she has refused medications due to cost, and advised that we don't know until after a patient calls [place] back whether a prescription is expensive or not, so I had no way to know her copay was $30.  Advised she contact us in the future if there is ever an issue with prior auth or costs.  For now given reported episode of hypoglycemia that we will hold off on Glyburide for now.  C/t metformin XR 500mg  daily, healthy diet, and try to be consistent with diet and not skip meals Call or return if worse or not improving.

## 2015-09-04 NOTE — Telephone Encounter (Signed)
Per Shane's notes at recent OV, hold Glyburide and she is only taking Metformin due to Hypoglycemia

## 2015-10-08 ENCOUNTER — Other Ambulatory Visit: Payer: Self-pay | Admitting: Medical

## 2015-10-08 NOTE — Telephone Encounter (Signed)
Is this ok to refill?  

## 2015-10-16 DIAGNOSIS — R69 Illness, unspecified: Secondary | ICD-10-CM | POA: Diagnosis not present

## 2015-12-24 DIAGNOSIS — E119 Type 2 diabetes mellitus without complications: Secondary | ICD-10-CM | POA: Diagnosis not present

## 2015-12-24 DIAGNOSIS — Z961 Presence of intraocular lens: Secondary | ICD-10-CM | POA: Diagnosis not present

## 2015-12-24 DIAGNOSIS — H524 Presbyopia: Secondary | ICD-10-CM | POA: Diagnosis not present

## 2015-12-24 LAB — HM DIABETES EYE EXAM

## 2015-12-26 ENCOUNTER — Other Ambulatory Visit: Payer: Self-pay | Admitting: Family Medicine

## 2015-12-29 ENCOUNTER — Encounter: Payer: Self-pay | Admitting: *Deleted

## 2016-01-14 ENCOUNTER — Encounter: Payer: Self-pay | Admitting: Family Medicine

## 2016-01-14 ENCOUNTER — Ambulatory Visit (INDEPENDENT_AMBULATORY_CARE_PROVIDER_SITE_OTHER): Payer: Medicare HMO | Admitting: Family Medicine

## 2016-01-14 VITALS — BP 124/64 | HR 68 | Temp 97.6°F | Wt 171.8 lb

## 2016-01-14 DIAGNOSIS — R69 Illness, unspecified: Secondary | ICD-10-CM | POA: Diagnosis not present

## 2016-01-14 DIAGNOSIS — R059 Cough, unspecified: Secondary | ICD-10-CM

## 2016-01-14 DIAGNOSIS — R05 Cough: Secondary | ICD-10-CM | POA: Diagnosis not present

## 2016-01-14 DIAGNOSIS — J069 Acute upper respiratory infection, unspecified: Secondary | ICD-10-CM | POA: Diagnosis not present

## 2016-01-14 MED ORDER — AZITHROMYCIN 250 MG PO TABS: ORAL_TABLET | ORAL | Status: DC

## 2016-01-14 NOTE — Patient Instructions (Signed)
If you are not feeling better by Friday, start taking the antibiotic. If you are not better 10 days after starting the antibiotic, let me know. Treat your symptoms, salt water gargles for your throat. Cough medication. Stay well hydrated.  Take Tylenol or ibuprofen for your body aches.

## 2016-01-14 NOTE — Progress Notes (Signed)
Subjective:  Marissa Oliver is a 75 y.o. female who presents for a 4 day history of nasal congestion, tickle in her throat, dry cough, legs feel achy. States cough is bothering her the most. States she initially got sick in February after taking a train back from [place] with similar upper respiratory symptoms but then was better for a couple of weeks and got sick again 4 days ago.   Denies fever, chills, dizziness, chest pain, palpitations, shortness of breath, LE edema, nausea, vomiting, diarrhea.   Treatment to date: aspirin 81 mg, alka seltzer plus.  Positive sick contacts- brother with cold.  No other aggravating or relieving factors.  No other c/o.  States she is trying to stop smoking, has not smoked in 3 days but is a long-time smoker.. No underlying allergies and no history of bronchitis or pneumonia. Denies history of asthma or COPD.  ROS as in subjective.   Objective: Filed Vitals:   01/14/16 1112  BP: 124/64  Pulse: 68  Temp: 97.6 F (36.4 C)    General appearance: Alert, WD/WN, no distress, mildly ill appearing                             Skin: warm, no rash                           Head: no sinus tenderness                            Eyes: conjunctiva normal, corneas clear, PERRLA                            Ears: pearly TMs, external ear canals normal                          Nose: septum midline, turbinates swollen, with erythema and clear discharge             Mouth/throat: MMM, tongue normal, mild pharyngeal erythema                           Neck: supple, no adenopathy, no thyromegaly, nontender                          Heart: RRR, normal S1, S2, no murmurs                         Lungs: CTA bilaterally, no wheezes, rales, or rhonchi     Assessment: Acute upper respiratory infection  Cough - Plan: azithromycin (ZITHROMAX Z-PAK) 250 MG tablet  Plan: Discussed diagnosis and treatment of URI. Suspect her symptoms are related to a viral etiology. Recommend that  she treat her symptoms and give her illness a couple of more days to see if she starts improving.  Suggested symptomatic OTC remedies. Nasal saline spray for congestion. Salt water gargles for throat discomfort. Tylenol or Ibuprofen OTC for fever and malaise.   Z-Pak sent to pharmacy with instructions to wait until Friday and see if she is improving or not. She will start the Z-Pak Friday if no improvement and then let me know if she is not back to 100% after completing the antibiotic. Discussed red flags  for pneumonia such as fever and productive cough. Attempted to send Tessalon to her pharmacy but this is not covered. Patient is aware and states she will continue taking her over-the-counter cough medication.

## 2016-01-25 ENCOUNTER — Other Ambulatory Visit: Payer: Self-pay | Admitting: Family Medicine

## 2016-01-29 ENCOUNTER — Ambulatory Visit (INDEPENDENT_AMBULATORY_CARE_PROVIDER_SITE_OTHER): Payer: Medicare HMO | Admitting: Medical

## 2016-01-29 ENCOUNTER — Encounter: Payer: Self-pay | Admitting: Medical

## 2016-01-29 VITALS — BP 150/94 | HR 68 | Temp 97.4°F | Wt 172.0 lb

## 2016-01-29 DIAGNOSIS — R300 Dysuria: Secondary | ICD-10-CM | POA: Diagnosis not present

## 2016-01-29 DIAGNOSIS — N3000 Acute cystitis without hematuria: Secondary | ICD-10-CM

## 2016-01-29 LAB — POCT URINALYSIS DIPSTICK
BILIRUBIN UA: NEGATIVE
Blood, UA: NEGATIVE
GLUCOSE UA: NEGATIVE
Ketones, UA: NEGATIVE
LEUKOCYTES UA: NEGATIVE
NITRITE UA: NEGATIVE
PH UA: 7
Protein, UA: NEGATIVE
Spec Grav, UA: 1.02
Urobilinogen, UA: NEGATIVE

## 2016-01-29 MED ORDER — CIPROFLOXACIN HCL 500 MG PO TABS: 500.0000 mg | ORAL_TABLET | Freq: Two times a day (BID) | ORAL | Status: DC

## 2016-01-29 NOTE — Progress Notes (Signed)
Subjective:  Marissa Oliver is a 75 y.o. female who complains of possible urinary tract infection.  She has had symptoms for 5 days.  Has used some Azo.   Symptoms including burning with urination, fatigue and sleepy as she has in past with UTI, urinary frequency, urgency.  No blood in urine, no fever, no NVD.  Left kidney area hurts a little.  No other aggravating or relieving factors. No other complaint.  Past Medical History  Diagnosis Date  . Dyslipidemia   . Diabetes mellitus 2005  . Smoker   . Hemorrhoids   . OAB (overactive bladder)   . GAD (generalized anxiety disorder)   . Diverticulosis   . Colon polyp     tubular adenoma (colonoscopy q5 yrs)  . Atrophic vaginitis     estrace cream rx'd by Dr. Karsten Ro  . Osteopenia 11/2013     Bone Density, loss of bone density compared to prior exam 2012  . Psoriasis     mild, sees [facility]    ROS as in subjective  Reviewed allergies, medications, past medical, surgical, and social history.    Objective: BP 150/94 mmHg  Pulse 68  Temp(Src) 97.4 F (36.3 C) (Tympanic)  Wt 172 lb (78.019 kg)  BP Readings from Last 3 Encounters:  01/29/16 150/94  01/14/16 124/64  09/03/15 120/68   General appearance: alert, no distress, WD/WN, female Abdomen: +bs, soft, non tender, non distended, no masses, no hepatomegaly, no splenomegaly Back: no CVA tenderness GU: deferred      Assessment: Encounter Diagnoses  Name Primary?  . Dysuria Yes  . Acute cystitis without hematuria      Plan: Reviewed prior cultures, mostly Klebsiella with minimal prior resistances.   Discussed symptoms, diagnosis, possible complications, and usual course of illness. Begin medication Cipro 3-5 days per orders below.  Advised increased water intake, can use OTC Tylenol for pain.   Call or return if worse or not improving.   F/u in the next month for routine med check.

## 2016-02-10 DIAGNOSIS — Z86010 Personal history of colonic polyps: Secondary | ICD-10-CM | POA: Diagnosis not present

## 2016-02-10 DIAGNOSIS — K5730 Diverticulosis of large intestine without perforation or abscess without bleeding: Secondary | ICD-10-CM | POA: Diagnosis not present

## 2016-02-10 DIAGNOSIS — E669 Obesity, unspecified: Secondary | ICD-10-CM | POA: Diagnosis not present

## 2016-02-10 DIAGNOSIS — Z1211 Encounter for screening for malignant neoplasm of colon: Secondary | ICD-10-CM | POA: Diagnosis not present

## 2016-02-16 DIAGNOSIS — Z1211 Encounter for screening for malignant neoplasm of colon: Secondary | ICD-10-CM | POA: Diagnosis not present

## 2016-02-16 DIAGNOSIS — Z1212 Encounter for screening for malignant neoplasm of rectum: Secondary | ICD-10-CM | POA: Diagnosis not present

## 2016-03-02 ENCOUNTER — Telehealth: Payer: Self-pay | Admitting: *Deleted

## 2016-03-02 NOTE — Telephone Encounter (Signed)
Prior authorization approved for Yuvafem at a lower co-pay expiration date 10/31/16 per [employer]

## 2016-03-04 ENCOUNTER — Encounter: Payer: Self-pay | Admitting: Medical

## 2016-03-04 ENCOUNTER — Ambulatory Visit (INDEPENDENT_AMBULATORY_CARE_PROVIDER_SITE_OTHER): Payer: Medicare HMO | Admitting: Medical

## 2016-03-04 VITALS — BP 110/70 | HR 65 | Temp 97.7°F | Wt 170.0 lb

## 2016-03-04 DIAGNOSIS — E118 Type 2 diabetes mellitus with unspecified complications: Secondary | ICD-10-CM | POA: Diagnosis not present

## 2016-03-04 DIAGNOSIS — E559 Vitamin D deficiency, unspecified: Secondary | ICD-10-CM | POA: Diagnosis not present

## 2016-03-04 DIAGNOSIS — R109 Unspecified abdominal pain: Secondary | ICD-10-CM | POA: Diagnosis not present

## 2016-03-04 DIAGNOSIS — R1032 Left lower quadrant pain: Secondary | ICD-10-CM

## 2016-03-04 DIAGNOSIS — N3281 Overactive bladder: Secondary | ICD-10-CM

## 2016-03-04 DIAGNOSIS — E785 Hyperlipidemia, unspecified: Secondary | ICD-10-CM | POA: Diagnosis not present

## 2016-03-04 DIAGNOSIS — E1169 Type 2 diabetes mellitus with other specified complication: Secondary | ICD-10-CM | POA: Insufficient documentation

## 2016-03-04 LAB — COMPREHENSIVE METABOLIC PANEL
ALBUMIN: 4.3 g/dL (ref 3.6–5.1)
ALK PHOS: 78 U/L (ref 33–130)
ALT: 15 U/L (ref 6–29)
AST: 14 U/L (ref 10–35)
BILIRUBIN TOTAL: 0.4 mg/dL (ref 0.2–1.2)
BUN: 13 mg/dL (ref 7–25)
CALCIUM: 9.6 mg/dL (ref 8.6–10.4)
CO2: 24 mmol/L (ref 20–31)
Chloride: 102 mmol/L (ref 98–110)
Creat: 0.64 mg/dL (ref 0.60–0.93)
GLUCOSE: 141 mg/dL — AB (ref 65–99)
POTASSIUM: 4.1 mmol/L (ref 3.5–5.3)
Sodium: 136 mmol/L (ref 135–146)
Total Protein: 6.6 g/dL (ref 6.1–8.1)

## 2016-03-04 LAB — POCT URINALYSIS DIPSTICK
Bilirubin, UA: NEGATIVE
Glucose, UA: NEGATIVE
KETONES UA: NEGATIVE
Leukocytes, UA: NEGATIVE
Nitrite, UA: NEGATIVE
PH UA: 7.5
PROTEIN UA: NEGATIVE
RBC UA: NEGATIVE
SPEC GRAV UA: 1.015
UROBILINOGEN UA: NEGATIVE

## 2016-03-04 LAB — CBC
HCT: 38.7 % (ref 35.0–45.0)
Hemoglobin: 13.2 g/dL (ref 11.7–15.5)
MCH: 29.3 pg (ref 27.0–33.0)
MCHC: 34.1 g/dL (ref 32.0–36.0)
MCV: 86 fL (ref 80.0–100.0)
MPV: 10.9 fL (ref 7.5–12.5)
PLATELETS: 177 10*3/uL (ref 140–400)
RBC: 4.5 MIL/uL (ref 3.80–5.10)
RDW: 14.4 % (ref 11.0–15.0)
WBC: 5.7 10*3/uL (ref 4.0–10.5)

## 2016-03-04 LAB — LIPID PANEL
CHOL/HDL RATIO: 3.9 ratio (ref <=5.0)
Cholesterol: 174 mg/dL (ref 125–200)
HDL: 45 mg/dL — ABNORMAL LOW (ref >=46)
LDL Cholesterol: 89 mg/dL (ref <130)
Triglycerides: 201 mg/dL — ABNORMAL HIGH (ref <150)
VLDL: 40 mg/dL — AB (ref <30)

## 2016-03-04 NOTE — Addendum Note (Signed)
Addended by: Billie Lade on: 03/04/2016 09:44 AM   Modules accepted: Orders

## 2016-03-04 NOTE — Progress Notes (Signed)
Subjective: Chief Complaint  Patient presents with  . med check    left a urine specimen because she wants it checked and she wants her kidney function looked at. states she has just "not felt good" said it comes and goes. is having urinary pain. lower abdominal pain only on left side. said she is just tired of being in pain and hurting all the time, is emotional about this pain.    Here for routine f/u and med check.  She has a hx/o diabetes, dyslipidemia, vit d deficiency, osteopenia, OAB.   Checks sugars once daily, getting 140s on average.   Checks feet daily, no concerns.   Exercise some with walking dog.  Part datsun and chihuahua.   Eating relatively healthy.  compliant with medications without c/o for cholesterol, diabetes, kidney protection.     Having some left flank pain, pain radiates down left lower abdomen.  Sometimes pain gets quite bad.  Having some discomfort in vaginal area, sees gynecology, having problems with medication costs.   Has pain more with urination than anything.    Denies blood in urine or odorous urine.  Just had Cologuard done, and got normal results per her report.   Past Medical History  Diagnosis Date  . Dyslipidemia   . Diabetes mellitus 2005  . Smoker   . Hemorrhoids   . OAB (overactive bladder)   . GAD (generalized anxiety disorder)   . Diverticulosis   . Colon polyp     tubular adenoma (colonoscopy q5 yrs)  . Atrophic vaginitis     estrace cream rx'd by Dr. Karsten Ro  . Osteopenia 11/2013     Bone Density, loss of bone density compared to prior exam 2012  . Psoriasis     mild, sees [facility]   Current Outpatient Prescriptions on File Prior to Visit  Medication Sig Dispense Refill  . alendronate (FOSAMAX) 70 MG tablet Take 1 tablet (70 mg total) by mouth every 7 (seven) days. Take with a full glass of water on an empty stomach. 12 tablet 3  . ergocalciferol (VITAMIN D2) 50000 UNITS capsule Take 1 capsule (50,000 Units total) by mouth  once a week. 12 capsule 3  . lisinopril (PRINIVIL,ZESTRIL) 5 MG tablet TAKE 1 TABLET BY MOUTH ONCE DAILY 90 tablet 0  . metFORMIN (GLUCOPHAGE-XR) 500 MG 24 hr tablet TAKE 1 TABLET BY MOUTH DAILY WITH BREAKFAST 90 tablet 0  . mirabegron ER (MYRBETRIQ) 25 MG TB24 tablet Take 1 tablet (25 mg total) by mouth daily. 30 tablet 2  . pravastatin (PRAVACHOL) 40 MG tablet Take 1 tablet (40 mg total) by mouth daily. 90 tablet 1   No current facility-administered medications on file prior to visit.    ROS as in subjective   Objective: BP 110/70 mmHg  Pulse 65  Temp(Src) 97.7 F (36.5 C) (Tympanic)  Wt 170 lb (77.111 kg)  General appearance: alert, no distress, WD/WN HEENT: normocephalic, sclerae anicteric, TMs pearly, nares patent, no discharge or erythema, pharynx normal Oral cavity: MMM, no lesions Neck: supple, no lymphadenopathy, no thyromegaly, no masses, no bruits Heart: RRR, normal S1, S2, no murmurs Lungs: CTA bilaterally, no wheezes, rhonchi, or rales Abdomen: +bs, soft, mild lower abdominal tenderness in general, otherwise non tender, non distended, no masses, no hepatomegaly, no splenomegaly Back: non tender Pulses: 2+ symmetric, upper and lower extremities, normal cap refill    Assessment: Encounter Diagnoses  Name Primary?  . Type 2 diabetes mellitus with complication, without long-term current use of insulin (  [place]) Yes  . Dyslipidemia   . Overactive bladder   . Vitamin D deficiency   . Abdominal pain, left lower quadrant   . Flank pain     Plan: Diabetes - c/t Metformin 500mg  XR daily, c/t Lisinopril 5mg  daily for renal protection.  Dyslipidemia - c/t Pravachol 40mg  increased last visit  OAB - c/t Myrbetriq 25mg  daily  Osteopenia - c/t Fosamax 70mg  weekly  Vit D deficiency - c/t Vit D 50,000 weekly  Abdominal pain - labs today, send urine for culture  F/u with gynecology and dermatology soon as planned  Recheck in 33mo for medicare wellness visit.   Marissa Oliver  was seen today for med check.  Diagnoses and all orders for this visit:  Type 2 diabetes mellitus with complication, without long-term current use of insulin (HCC) -     Comprehensive metabolic panel -     Lipid panel -     Hemoglobin A1c -     CBC  Dyslipidemia -     Comprehensive metabolic panel -     Lipid panel -     Hemoglobin A1c -     CBC  Overactive bladder  Vitamin D deficiency  Abdominal pain, left lower quadrant -     Urine culture  Flank pain -     Urine culture  '

## 2016-03-05 ENCOUNTER — Other Ambulatory Visit: Payer: Self-pay | Admitting: Medical

## 2016-03-05 LAB — HEMOGLOBIN A1C
HEMOGLOBIN A1C: 7.4 % — AB (ref <5.7)
MEAN PLASMA GLUCOSE: 166 mg/dL

## 2016-03-05 LAB — URINE CULTURE: Colony Count: 30000

## 2016-03-05 MED ORDER — ALENDRONATE SODIUM 70 MG PO TABS: 70.0000 mg | ORAL_TABLET | ORAL | Status: DC

## 2016-03-05 MED ORDER — METFORMIN HCL ER 500 MG PO TB24: 500.0000 mg | ORAL_TABLET | Freq: Every day | ORAL | Status: DC

## 2016-03-05 MED ORDER — GLUCOSE BLOOD VI STRP: 1.0000 | ORAL_STRIP | Freq: Three times a day (TID) | Status: DC

## 2016-03-05 MED ORDER — PRAVASTATIN SODIUM 40 MG PO TABS: 40.0000 mg | ORAL_TABLET | Freq: Every day | ORAL | Status: DC

## 2016-03-05 MED ORDER — LISINOPRIL 5 MG PO TABS: 5.0000 mg | ORAL_TABLET | Freq: Every day | ORAL | Status: DC

## 2016-03-05 MED ORDER — TRAMADOL HCL 50 MG PO TABS: 50.0000 mg | ORAL_TABLET | Freq: Four times a day (QID) | ORAL | Status: DC | PRN

## 2016-03-05 MED ORDER — ERGOCALCIFEROL 1.25 MG (50000 UT) PO CAPS: 50000.0000 [IU] | ORAL_CAPSULE | ORAL | Status: DC

## 2016-03-05 NOTE — Addendum Note (Signed)
Addended by: Billie Lade on: 03/05/2016 12:21 PM   Modules accepted: Orders, Medications

## 2016-03-08 ENCOUNTER — Encounter: Payer: Self-pay | Admitting: Medical

## 2016-03-08 ENCOUNTER — Telehealth: Payer: Self-pay | Admitting: Medical

## 2016-03-08 NOTE — Telephone Encounter (Signed)
Pt called requesting that her recent labs be sent to [facility]  i printed them off and faxed them off per her request.

## 2016-03-09 DIAGNOSIS — N952 Postmenopausal atrophic vaginitis: Secondary | ICD-10-CM | POA: Diagnosis not present

## 2016-03-09 DIAGNOSIS — R102 Pelvic and perineal pain: Secondary | ICD-10-CM | POA: Diagnosis not present

## 2016-03-09 DIAGNOSIS — R350 Frequency of micturition: Secondary | ICD-10-CM | POA: Diagnosis not present

## 2016-03-09 DIAGNOSIS — R300 Dysuria: Secondary | ICD-10-CM | POA: Diagnosis not present

## 2016-03-09 DIAGNOSIS — Z0000 Encounter for general adult medical examination without abnormal findings: Secondary | ICD-10-CM | POA: Diagnosis not present

## 2016-03-10 ENCOUNTER — Telehealth: Payer: Self-pay | Admitting: Medical

## 2016-03-10 NOTE — Telephone Encounter (Signed)
I completed the optum form.  Please let patient know we receive an insurance overview form, and it is recommended she has a medicare annual wellness visit/preventative health care visit once yearly, so set her up for this if it has been > 1 year since last one.   This can be in 64mo when she is due for diabetes f/u anyhow.

## 2016-03-15 NOTE — Telephone Encounter (Signed)
The medicare wellness/AWV is probably not done through gyn.  Please confirm with Dr. Toney Rakes office, but this should be through [place].

## 2016-03-15 NOTE — Telephone Encounter (Signed)
Pt sees her ob/gyn for yearly cpe's/wellness visits

## 2016-03-17 DIAGNOSIS — L400 Psoriasis vulgaris: Secondary | ICD-10-CM | POA: Diagnosis not present

## 2016-03-17 DIAGNOSIS — D485 Neoplasm of uncertain behavior of skin: Secondary | ICD-10-CM | POA: Diagnosis not present

## 2016-03-17 DIAGNOSIS — L821 Other seborrheic keratosis: Secondary | ICD-10-CM | POA: Diagnosis not present

## 2016-03-17 DIAGNOSIS — D2239 Melanocytic nevi of other parts of face: Secondary | ICD-10-CM | POA: Diagnosis not present

## 2016-03-17 DIAGNOSIS — D692 Other nonthrombocytopenic purpura: Secondary | ICD-10-CM | POA: Diagnosis not present

## 2016-03-22 DIAGNOSIS — R102 Pelvic and perineal pain: Secondary | ICD-10-CM | POA: Diagnosis not present

## 2016-03-25 ENCOUNTER — Other Ambulatory Visit: Payer: Self-pay | Admitting: Family Medicine

## 2016-03-25 NOTE — Telephone Encounter (Signed)
Called melissa to verify how Dr. Toney Rakes is billing for his appts. He is billing for CPE so we can't do a cpe. We can do a AWV and a medcheck plus. Please advise how to handle.

## 2016-03-25 NOTE — Telephone Encounter (Signed)
Is this something patient takes long term? See note from 03/04/16/.

## 2016-04-01 DIAGNOSIS — R350 Frequency of micturition: Secondary | ICD-10-CM | POA: Diagnosis not present

## 2016-04-12 ENCOUNTER — Other Ambulatory Visit: Payer: Self-pay | Admitting: Family Medicine

## 2016-04-12 DIAGNOSIS — R69 Illness, unspecified: Secondary | ICD-10-CM | POA: Diagnosis not present

## 2016-04-13 ENCOUNTER — Other Ambulatory Visit: Payer: Self-pay | Admitting: Medical

## 2016-04-13 NOTE — Telephone Encounter (Signed)
Is this ok to refill?  

## 2016-04-13 NOTE — Telephone Encounter (Signed)
Wells Guiles phoned in

## 2016-04-13 NOTE — Telephone Encounter (Signed)
Call out refill

## 2016-04-20 ENCOUNTER — Ambulatory Visit (INDEPENDENT_AMBULATORY_CARE_PROVIDER_SITE_OTHER): Payer: Medicare HMO | Admitting: Medical

## 2016-04-20 ENCOUNTER — Encounter: Payer: Self-pay | Admitting: Medical

## 2016-04-20 VITALS — BP 126/70 | HR 72

## 2016-04-20 DIAGNOSIS — R300 Dysuria: Secondary | ICD-10-CM | POA: Diagnosis not present

## 2016-04-20 LAB — POCT URINALYSIS DIPSTICK
BILIRUBIN UA: NEGATIVE
GLUCOSE UA: NEGATIVE
KETONES UA: NEGATIVE
LEUKOCYTES UA: NEGATIVE
NITRITE UA: NEGATIVE
PH UA: 7
Protein, UA: NEGATIVE
Spec Grav, UA: 1.015
Urobilinogen, UA: NEGATIVE

## 2016-04-20 MED ORDER — SULFAMETHOXAZOLE-TRIMETHOPRIM 800-160 MG PO TABS: 1.0000 | ORAL_TABLET | Freq: Two times a day (BID) | ORAL | Status: DC

## 2016-04-20 NOTE — Progress Notes (Signed)
Subjective:  Marissa Oliver is a 75 y.o. female who complains of possible urinary tract infection.  She has had symptoms for 5 days.  Symptoms include urinary frequncy, discomfort with urination, some urgency. Patient denies fever, NVD, abdominal pain.  does have some left flank discomfort. .  Last UTI was few months ago.  Saw Dr. Karsten Ro within last 2 mo for same, had infection on urinary cath culture.   Using nothing for current symptoms.   Urine is a little cloudy.  No other aggravating or relieving factors.  No other c/o.  Past Medical History  Diagnosis Date  . Dyslipidemia   . Diabetes mellitus 2005  . Smoker   . Hemorrhoids   . OAB (overactive bladder)   . GAD (generalized anxiety disorder)   . Diverticulosis   . Colon polyp     tubular adenoma (colonoscopy q5 yrs)  . Atrophic vaginitis     estrace cream rx'd by Dr. Karsten Ro  . Osteopenia 11/2013     Bone Density, loss of bone density compared to prior exam 2012  . Psoriasis     mild, sees [facility]    ROS as in subjective  Reviewed allergies, medications, past medical, surgical, and social history.    Objective: Filed Vitals:   04/20/16 1300  BP: 126/70  Pulse: 72    General appearance: alert, no distress, WD/WN, female Abdomen: +bs, soft, non tender, non distended, no masses, no hepatomegaly, no splenomegaly, no bruits Back: no CVA tenderness GU: deferred       Assessment: Encounter Diagnosis  Name Primary?  . Dysuria Yes     Plan: Discussed symptoms, diagnosis, possible complications, and usual course of illness.  Will request urology records from recent visit and culture.  Begin Bactrim.  Hydrate well with water.   Call or return if worse or not improving.

## 2016-04-21 ENCOUNTER — Telehealth: Payer: Self-pay | Admitting: Family Medicine

## 2016-04-21 ENCOUNTER — Encounter: Payer: Medicare HMO | Admitting: Gynecology

## 2016-04-21 NOTE — Telephone Encounter (Signed)
Records recd from Dr. Karsten Ro gave to [facility]

## 2016-04-26 ENCOUNTER — Ambulatory Visit (INDEPENDENT_AMBULATORY_CARE_PROVIDER_SITE_OTHER): Payer: Medicare HMO | Admitting: Gynecology

## 2016-04-26 ENCOUNTER — Encounter: Payer: Self-pay | Admitting: Gynecology

## 2016-04-26 ENCOUNTER — Other Ambulatory Visit: Payer: Self-pay | Admitting: *Deleted

## 2016-04-26 VITALS — BP 128/76

## 2016-04-26 DIAGNOSIS — Z79890 Hormone replacement therapy (postmenopausal): Secondary | ICD-10-CM

## 2016-04-26 DIAGNOSIS — R1031 Right lower quadrant pain: Secondary | ICD-10-CM

## 2016-04-26 DIAGNOSIS — N952 Postmenopausal atrophic vaginitis: Secondary | ICD-10-CM | POA: Diagnosis not present

## 2016-04-26 MED ORDER — ESTROGENS, CONJUGATED 0.625 MG/GM VA CREA: TOPICAL_CREAM | VAGINAL | Status: DC

## 2016-04-26 MED ORDER — ESTRADIOL 0.1 MG/GM VA CREA: 2.0000 g | TOPICAL_CREAM | VAGINAL | Status: DC

## 2016-04-26 NOTE — Telephone Encounter (Signed)
Pt aware, Rx sent. 

## 2016-04-26 NOTE — Telephone Encounter (Signed)
What are the directions and dosage? 0.5 grams daily or twice weekly?

## 2016-04-26 NOTE — Progress Notes (Signed)
   HPI: Patient is a 75 year old who presented to the office today complaining of irritation near her urethra time of urination. Patient had been seen by Dr. Kathie Rhodes urologist who recently ordered a CT scan without contrast on 03/23/1999 1613 for which she brought copy of the report and the interpretation was no acute findings in the pelvis only degenerative changes at the pubic symphysis. He had been treating her for overactive bladder in the past whereby she was in the process of having percutaneous tibial nerve stimulation placed. He recently gave her samples of peridium that she is taking daily because of her bladder spasms. She denies any fever, chills, nausea, vomiting or back pain. 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 She stated that she was recently treated for urinary tract infection. Patient with past history of vaginal hysterectomy 1972. She's had some on and off nonspecific left lower back and lower abdomen discomfort. She reports normal bowel movement.   ROS: A ROS was performed and pertinent positives and negatives are included in the history.  GENERAL: No fevers or chills. HEENT: No change in vision, no earache, sore throat or sinus congestion. NECK: No pain or stiffness. CARDIOVASCULAR: No chest pain or pressure. No palpitations. PULMONARY: No shortness of breath, cough or wheeze. GASTROINTESTINAL: No abdominal pain, nausea, vomiting or diarrhea, melena or bright red blood per rectum. GENITOURINARY: No urinary frequency, urgency, hesitancy or dysuria. MUSCULOSKELETAL: No joint or muscle pain, no back pain, no recent trauma. DERMATOLOGIC: No rash, no itching, no lesions. ENDOCRINE: No polyuria, polydipsia, no heat or cold intolerance. No recent change in weight. HEMATOLOGICAL: No anemia or easy bruising or bleeding. NEUROLOGIC: No headache, seizures, numbness, tingling or weakness. PSYCHIATRIC: No depression, no loss of interest in normal  activity or change in sleep pattern.   PE: Pelvic exam: Bartholin urethra Skene glands with atrophic changes Vagina: Vaginal mucosa thin atrophic changes Vaginal cuff intact Bimanual exam no palpable mass or tenderness Rectal vaginal exam unremarkable   Assessment Plan: 75 year old with vaginal atrophy will switch her estrogen from the Vagifem tablet twice a week to Estrace vaginal cream to apply daily at bedtime for 5 nights consecutively and then twice a week thereafter. She is to apply inside the vagina as well as the external genitalia run the urethra which she's had irritation. Review of recent CT scan in 03/22/2016 was normal.    Greater than 50% of time was spent in counseling and coordinating care of this patient.   Time of consultation: 15   Minutes.

## 2016-04-26 NOTE — Telephone Encounter (Signed)
Premarin vaginal cream 0.5 g apply daily at bedtime for one week then twice a week thereafter 12 refills 11

## 2016-04-26 NOTE — Patient Instructions (Signed)
Estradiol vaginal cream What is this medicine? ESTRADIOL (es tra DYE ole) contains the female hormone estrogen. It is used for symptoms of menopause, like vaginal dryness and irritation. This medicine may be used for other purposes; ask your health care provider or pharmacist if you have questions. What should I tell my health care provider before I take this medicine? They need to know if you have any of these conditions: -abnormal vaginal bleeding -blood vessel disease or blood clots -breast, cervical, endometrial, ovarian, liver, or uterine cancer -dementia -diabetes -gallbladder disease -heart disease or recent heart attack -high blood pressure -high cholesterol -high levels of calcium in the blood -hysterectomy -kidney disease -liver disease -migraine headaches -protein C deficiency -protein S deficiency -stroke -systemic lupus erythematosus (SLE) -tobacco smoker -an unusual or allergic reaction to estrogens, other hormones, soy, other medicines, foods, dyes, or preservatives -pregnant or trying to get pregnant -breast-feeding How should I use this medicine? This medicine is for use in the vagina only. Do not take by mouth. Follow the directions on the prescription label. Read package directions carefully before using. Use the special applicator supplied with the cream. Wash hands before and after use. Fill the applicator with the prescribed amount of cream. Lie on your back, part and bend your knees. Insert the applicator into the vagina and push the plunger to expel the cream into the vagina. Wash the applicator with warm soapy water and rinse well. Use exactly as directed for the complete length of time prescribed. Do not stop using except on the advice of your doctor or health care professional. A patient package insert for the product will be given with each prescription and refill. Read this sheet carefully each time. The sheet may change frequently. Talk to your  pediatrician regarding the use of this medicine in children. This medicine is not approved for use in children. Overdosage: If you think you have taken too much of this medicine contact a poison control center or emergency room at once. NOTE: This medicine is only for you. Do not share this medicine with others. What if I miss a dose? If you miss a dose, use it as soon as you can. If it is almost time for your next dose, use only that dose. Do not use double or extra doses. What may interact with this medicine? Do not take this medicine with any of the following medications: -aromatase inhibitors like aminoglutethimide, anastrozole, exemestane, letrozole, testolactone This medicine may also interact with the following medications: -barbiturates used for inducing sleep or treating seizures -carbamazepine -grapefruit juice -medicines for fungal infections like ketoconazole and itraconazole -raloxifene -rifabutin -rifampin -rifapentine -ritonavir -some antibiotics used to treat infections -St. John's Wort -tamoxifen -warfarin This list may not describe all possible interactions. Give your health care provider a list of all the medicines, herbs, non-prescription drugs, or dietary supplements you use. Also tell them if you smoke, drink alcohol, or use illegal drugs. Some items may interact with your medicine. What should I watch for while using this medicine? Visit your health care professional for regular checks on your progress. You will need a regular breast and pelvic exam. You should also discuss the need for regular mammograms with your health care professional, and follow his or her guidelines. This medicine can make your body retain fluid, making your fingers, hands, or ankles swell. Your blood pressure can go up. Contact your doctor or health care professional if you feel you are retaining fluid. If you have any reason to think   you are pregnant, stop taking this medicine at once and  contact your doctor or health care professional. Tobacco smoking increases the risk of getting a blood clot or having a stroke, especially if you are more than 77 years old. You are strongly advised not to smoke. If you wear contact lenses and notice visual changes, or if the lenses begin to feel uncomfortable, consult your eye care specialist. If you are going to have elective surgery, you may need to stop taking this medicine beforehand. Consult your health care professional for advice prior to scheduling the surgery. What side effects may I notice from receiving this medicine? Side effects that you should report to your doctor or health care professional as soon as possible: -allergic reactions like skin rash, itching or hives, swelling of the face, lips, or tongue -breast tissue changes or discharge -changes in vision -chest pain -confusion, trouble speaking or understanding -dark urine -general ill feeling or flu-like symptoms -light-colored stools -nausea, vomiting -pain, swelling, warmth in the leg -right upper belly pain -severe headaches -shortness of breath -sudden numbness or weakness of the face, arm or leg -trouble walking, dizziness, loss of balance or coordination -unusual vaginal bleeding -yellowing of the eyes or skin Side effects that usually do not require medical attention (report to your doctor or health care professional if they continue or are bothersome): -hair loss -increased hunger or thirst -increased urination -symptoms of vaginal infection like itching, irritation or unusual discharge -unusually weak or tired This list may not describe all possible side effects. Call your doctor for medical advice about side effects. You may report side effects to FDA at 1-800-FDA-1088. Where should I keep my medicine? Keep out of the reach of children. Store at room temperature between 15 and 30 degrees C (59 and 86 degrees F). Protect from temperatures above 40 degrees C  (104 degrees C). Do not freeze. Throw away any unused medicine after the expiration date. NOTE: This sheet is a summary. It may not cover all possible information. If you have questions about this medicine, talk to your doctor, pharmacist, or health care provider.    2016, Elsevier/[employer] Standard. (2011-01-20 09:18:12)  

## 2016-04-26 NOTE — Telephone Encounter (Signed)
This will work just as well if her [employer] will cover it

## 2016-04-26 NOTE — Telephone Encounter (Signed)
Pt was prescribed estrace vaginal cream today,medication is $200 pt has not yet met her deductible, asked if premarin vaginal cream would be an option? Please advise

## 2016-04-29 ENCOUNTER — Telehealth: Payer: Self-pay | Admitting: Medical

## 2016-04-29 ENCOUNTER — Encounter: Payer: Self-pay | Admitting: Medical

## 2016-04-29 ENCOUNTER — Ambulatory Visit (INDEPENDENT_AMBULATORY_CARE_PROVIDER_SITE_OTHER): Payer: Medicare HMO | Admitting: Medical

## 2016-04-29 ENCOUNTER — Other Ambulatory Visit: Payer: Self-pay | Admitting: Family Medicine

## 2016-04-29 ENCOUNTER — Other Ambulatory Visit: Payer: Self-pay | Admitting: Gynecology

## 2016-04-29 VITALS — BP 120/70 | HR 69 | Ht 62.0 in | Wt 172.0 lb

## 2016-04-29 DIAGNOSIS — Z129 Encounter for screening for malignant neoplasm, site unspecified: Secondary | ICD-10-CM | POA: Insufficient documentation

## 2016-04-29 DIAGNOSIS — R32 Unspecified urinary incontinence: Secondary | ICD-10-CM

## 2016-04-29 DIAGNOSIS — F411 Generalized anxiety disorder: Secondary | ICD-10-CM

## 2016-04-29 DIAGNOSIS — Z860100 Personal history of colon polyps, unspecified: Secondary | ICD-10-CM

## 2016-04-29 DIAGNOSIS — E559 Vitamin D deficiency, unspecified: Secondary | ICD-10-CM

## 2016-04-29 DIAGNOSIS — N3281 Overactive bladder: Secondary | ICD-10-CM

## 2016-04-29 DIAGNOSIS — E785 Hyperlipidemia, unspecified: Secondary | ICD-10-CM

## 2016-04-29 DIAGNOSIS — E118 Type 2 diabetes mellitus with unspecified complications: Secondary | ICD-10-CM

## 2016-04-29 DIAGNOSIS — Z0000 Encounter for general adult medical examination without abnormal findings: Secondary | ICD-10-CM

## 2016-04-29 DIAGNOSIS — M8580 Other specified disorders of bone density and structure, unspecified site: Secondary | ICD-10-CM

## 2016-04-29 DIAGNOSIS — Z7185 Encounter for immunization safety counseling: Secondary | ICD-10-CM

## 2016-04-29 DIAGNOSIS — Z7189 Other specified counseling: Secondary | ICD-10-CM

## 2016-04-29 DIAGNOSIS — F17200 Nicotine dependence, unspecified, uncomplicated: Secondary | ICD-10-CM

## 2016-04-29 DIAGNOSIS — N952 Postmenopausal atrophic vaginitis: Secondary | ICD-10-CM

## 2016-04-29 LAB — POCT URINALYSIS DIPSTICK
BILIRUBIN UA: NEGATIVE
Blood, UA: NEGATIVE
GLUCOSE UA: NEGATIVE
KETONES UA: NEGATIVE
Leukocytes, UA: NEGATIVE
Nitrite, UA: NEGATIVE
Protein, UA: NEGATIVE
Spec Grav, UA: 1.015
UROBILINOGEN UA: NEGATIVE
pH, UA: 6

## 2016-04-29 MED ORDER — TRAMADOL HCL 50 MG PO TABS: 50.0000 mg | ORAL_TABLET | Freq: Four times a day (QID) | ORAL | Status: DC | PRN

## 2016-04-29 NOTE — Telephone Encounter (Signed)
Printed copy of Living will for Marissa Oliver to review and it is from 2005 & very limited and we don't have a health care POA.  Left message for pt to see if she wants to update it and/or use our standard form since it is more detailed

## 2016-04-29 NOTE — Addendum Note (Signed)
Addended by: Carlena Hurl on: 04/29/2016 11:54 AM   Modules accepted: Orders

## 2016-04-29 NOTE — Patient Instructions (Addendum)
Recommendations:  Get mammogram every 2 years.  You are due this year  We recommend a repeat bone density scan in 2018 See your eye doctor yearly for routine vision care. See your gynecologist yearly for routine gynecological care. Exercise several days per week with walking and some weight bearing exercise Eat a healthy low fat diet Try to not smoke at all Check feet daily for wounds and sores Check insurance for ABI, ankle brachial index screening for peripheral vascular disease

## 2016-04-29 NOTE — Addendum Note (Signed)
Addended by: Billie Lade on: 04/29/2016 11:42 AM   Modules accepted: Orders, SmartSet

## 2016-04-29 NOTE — Telephone Encounter (Signed)
Pt called back & asked that we mail her the form which I did

## 2016-04-29 NOTE — Progress Notes (Addendum)
Subjective:    Marissa Oliver is a 75 y.o. female who presents for Preventative Services visit and chronic medical problems/med check visit.    Primary Care Provider Crisoforo Oxford, PA-C here for primary care  Current Health Care Team:  Eye doctor, Dr. Lady Gary Opthalmology Glade Lloyd, DAVID Pomona, PA-C GYN, Dr Toney Rakes Nephrology, Dr. Consuella Lose Dermatology  Dr. Acie Fredrickson, cardiology  Medical Services you may have received from other than [facility] providers in the past year (date may be approximate) none  Exercise Current exercise habits: Home exercise routine includes walking 1 hrs per week.   Nutrition/Diet Current diet: well balanced  Depression Screen Depression screen PHQ 2/9 07/15/2014  Decreased Interest 0  Down, Depressed, Hopeless 0  PHQ - 2 Score 0    Activities of Daily Living Screen/Functional Status Survey Functional Status Survey: Is the patient deaf or have difficulty hearing?: No Does the patient have difficulty seeing, even when wearing glasses/contacts?: No Does the patient have difficulty concentrating, remembering, or making decisions?: No Does the patient have difficulty walking or climbing stairs?: No Does the patient have difficulty dressing or bathing?: No Does the patient have difficulty doing errands alone such as visiting a doctor's office or shopping?: No   Can patient draw a clock face showing 3:15 o'clock, yes, reviewed  [place]  07/15/2014  Falls in the past year? No    Gait Assessment: Normal gait observed yes  Advanced directives Does patient have a [place]? Yes Does patient have a Living Will? Yes  Past Medical History  Diagnosis Date  . Dyslipidemia   . Diabetes mellitus 2005  . Smoker   . Hemorrhoids   . OAB (overactive bladder)   . GAD (generalized anxiety disorder)   . Diverticulosis   . Colon polyp     tubular adenoma (colonoscopy q5 yrs)  . Atrophic vaginitis     estrace  cream rx'd by Dr. Karsten Ro  . Osteopenia 11/2013     Bone Density, loss of bone density compared to prior exam 2012  . Psoriasis     mild, sees [facility]    Past Surgical History  Procedure Laterality Date  . Abdominal hysterectomy  1972    partial  . Pubovaginal sling  04/1998  . Tubal ligation    . Appendectomy    . Cardiovascular stress test  12/25/2004    EF 65%. OVERALL NORMAL STRESS CARDIOLITE. NO EVIDENCE  OF ISCHEMIA  . Colonoscopy  08/2010    Social History   Social History  . Marital Status: Divorced    Spouse Name: N/A  . Number of Children: N/A  . Years of Education: N/A   Occupational History  . Not on file.   Social History Main Topics  . Smoking status: Former Smoker -- 0.50 packs/day    Quit date: 05/22/2012  . Smokeless tobacco: Never Used  . Alcohol Use: No  . Drug Use: No  . Sexual Activity: Not Currently   Other Topics Concern  . 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 Arthritis Mother   . Heart failure Mother   . Mental illness Father   . Kidney disease Father   . Stroke Father   . Heart attack Father   . Heart disease Brother   . Cancer Brother     prostate  .  Stroke Maternal Aunt   . Cancer Maternal Uncle   . Stroke Paternal Grandmother      Current outpatient prescriptions:  .  alendronate (FOSAMAX) 70 MG tablet, Take 1 tablet (70 mg total) by mouth every 7 (seven) days. Take with a full glass of water on an empty stomach., Disp: 12 tablet, Rfl: 3 .  conjugated estrogens (PREMARIN) vaginal cream, Insert 0.5 grams vaginally at bedtime nightly x 1 week then twice weekly, Disp: 42.5 g, Rfl: 11 .  lisinopril (PRINIVIL,ZESTRIL) 5 MG tablet, Take 1 tablet (5 mg total) by mouth daily., Disp: 90 tablet, Rfl: 3 .  metFORMIN (GLUCOPHAGE-XR) 500 MG 24 hr tablet, Take 1 tablet (500 mg total) by mouth daily with breakfast.,  Disp: 90 tablet, Rfl: 3 .  mirabegron ER (MYRBETRIQ) 25 MG TB24 tablet, Take 1 tablet (25 mg total) by mouth daily., Disp: 30 tablet, Rfl: 2 .  ONE TOUCH ULTRA TEST test strip, USE TO CHECK BLOOD SUGAR ONCE DAILY, Disp: 100 each, Rfl: 3 .  pravastatin (PRAVACHOL) 40 MG tablet, Take 1 tablet (40 mg total) by mouth daily., Disp: 90 tablet, Rfl: 3 .  sulfamethoxazole-trimethoprim (BACTRIM DS,SEPTRA DS) 800-160 MG tablet, Take 1 tablet by mouth 2 (two) times daily., Disp: 14 tablet, Rfl: 0 .  traMADol (ULTRAM) 50 MG tablet, TAKE 1 TABLET BY MOUTH EVERY 6 HOURS AS NEEDED., Disp: 15 tablet, Rfl: 0 .  Vitamin D, Ergocalciferol, (DRISDOL) 50000 units CAPS capsule, TAKE 1 CAPSULE BY MOUTH ONCE A WEEK, Disp: 12 capsule, Rfl: 0  No Known Allergies  History reviewed: allergies, current medications, past family history, past medical history, past social history, past surgical history and problem list  Chronic issues discussed: Reviewed 03/2016 med check visit  Acute issues discussed: none  Objective:    Biometrics BP 120/70 mmHg  Pulse 69  Ht 5\' 2"  (1.575 m)  Wt 172 lb (78.019 kg)  BMI 31.45 kg/m2  Cognitive Testing  Alert? Yes  Normal Appearance?Yes  Oriented to person? Yes  Place? Yes   Time? Yes  Recall of three objects?  Yes  Can perform simple calculations? Yes  Displays appropriate judgment?Yes  Can read the correct time from a watch face?Yes  General appearance: alert, no distress, WD/WN, white  female  Nutritional Status: Inadequate calore intake? no Loss of muscle mass? no Loss of fat beneath skin? no Localized or general edema? no Diminished functional status? no  Other pertinent exam: HEENT: normocephalic, sclerae anicteric, TMs pearly, nares patent, no discharge or erythema, pharynx normal Oral cavity: MMM, upper denture present, no other lesions Neck: supple, no lymphadenopathy, no thyromegaly, no masses, no bruits Heart: RRR, normal S1, S2, no murmurs Lungs: CTA  bilaterally, no wheezes, rhonchi, or rales Abdomen: +bs, soft, non tender, non distended, no masses, no hepatomegaly, no splenomegaly Musculoskeletal: non tender, no swelling, no obvious deformity Extremities: no edema, no cyanosis, no clubbing Pulses: 1+ symmetric, upper and lower extremities, normal cap refill Neurological: alert, oriented x 3, CN2-12 intact, strength normal upper extremities and lower extremities, sensation normal throughout, DTRs 2+ throughout, no cerebellar signs, gait normal Psychiatric: normal affect, behavior normal, pleasant  Breast/gyn - deferred to gyn Skin: scattered macules, solar keratosis right forearm, few cherry hemangiomas on torso anterior and posterior, no worrisome lesions  Assessment:   Encounter Diagnoses  Name Primary?  . Medicare annual wellness visit, subsequent Yes  . Encounter for health maintenance examination in adult   . Type 2 diabetes mellitus with complication, without long-term current use  of insulin ([place])   . Vitamin D deficiency   . Overactive bladder   . Generalized anxiety disorder   . Urinary incontinence, unspecified incontinence type   . Atrophic vaginitis   . Osteopenia   . Dyslipidemia   . Tobacco use disorder   . History of colonic polyps   . Vaccine counseling      Plan:   A preventative services visit was completed today.  During the course of the visit today, we discussed and counseled about appropriate screening and preventive services.  A health risk assessment was established today that included a review of current medications, allergies, social history, family history, medical and preventative health history, biometrics, and preventative screenings to identify potential safety concerns or impairments.  A personalized plan was printed today for your records and use.   Personalized health advice and education was given today to reduce health risks and promote self management and wellness.  Information regarding end  of life planning was discussed today.  Conditions/risks identified: none  Chronic problems discussed today: Reviewed recent 03/2016 med check visit.   No changes, c/t same medication  Acute problems discussed today: none  Recommendations:  I recommend a yearly ophthalmology/optometry visit for glaucoma screening and eye checkup  I recommended a yearly dental visit for hygiene and checkup  Advanced directives - will pull paper chart for verification of copy.  I recommend a screening mammogram every 1-2 years  She reports Cologuard 01/2016, normal per patient with [facility]/ GI  Your last bone density screen was 11/2013.  I recommend you have a repeat bone density screen no sooner than 10/2016.   She has been Fosamax regularly since mid 2015.  Your last diabetes screen was 12/2015.     Your last lipid/cholesterol screen was 12/2015  Referrals today: none  Immunizations: I recommended a yearly influenza vaccine, typically in September when the vaccine is usually available Is the Pneumococcal vaccine up to date: yes. Is the Shingles vaccine up to date: no.  Can't afford. Is the Td/Tdap vaccine up to date: yes.   Medicare Attestation A preventative services visit was completed today.  During the course of the visit the patient was educated and counseled about appropriate screening and preventive services.  A health risk assessment was established with the patient that included a review of current medications, allergies, social history, family history, medical and preventative health history, biometrics, and preventative screenings to identify potential safety concerns or impairments.  A personalized plan was printed today for the patient's records and use.   Personalized health advice and education was given today to reduce health risks and promote self management and wellness.  Information regarding end of life planning was discussed today.  Crisoforo Oxford, PA-C   04/29/2016

## 2016-05-03 ENCOUNTER — Telehealth: Payer: Self-pay | Admitting: *Deleted

## 2016-05-03 NOTE — Telephone Encounter (Signed)
That would be fine call in Vagifem 10 mcg one tablet twice a week vaginally. # 8 refill x 11

## 2016-05-03 NOTE — Telephone Encounter (Signed)
Pt was prescribed premarin vaginal cream tried medication and it cause vaginal burning and after reading all the side effects pt is going to stop medication after using x 1 week. Pt would like to start back on generic vagifem twice weekly. Please advise

## 2016-05-03 NOTE — Telephone Encounter (Signed)
Pt is going to hold off on Rx below, states she was using too much of the cream. Will call back if she wants vagifem Rx.

## 2016-05-12 ENCOUNTER — Ambulatory Visit: Payer: Medicare HMO | Admitting: Gynecology

## 2016-05-17 DIAGNOSIS — Z1231 Encounter for screening mammogram for malignant neoplasm of breast: Secondary | ICD-10-CM | POA: Diagnosis not present

## 2016-05-17 LAB — HM MAMMOGRAPHY: HM MAMMO: NORMAL (ref 0–4)

## 2016-05-20 ENCOUNTER — Telehealth: Payer: Self-pay

## 2016-05-20 ENCOUNTER — Encounter: Payer: Self-pay | Admitting: Medical

## 2016-05-20 NOTE — Telephone Encounter (Signed)
Called and let pt know that her mammogram was normal with no worrisome findings

## 2016-05-25 DIAGNOSIS — M810 Age-related osteoporosis without current pathological fracture: Secondary | ICD-10-CM | POA: Diagnosis not present

## 2016-05-25 LAB — HM DEXA SCAN

## 2016-05-27 ENCOUNTER — Encounter: Payer: Self-pay | Admitting: Medical

## 2016-05-28 ENCOUNTER — Encounter: Payer: Self-pay | Admitting: Gynecology

## 2016-05-28 ENCOUNTER — Encounter: Payer: Self-pay | Admitting: Medical

## 2016-06-02 ENCOUNTER — Telehealth: Payer: Self-pay

## 2016-06-02 NOTE — Telephone Encounter (Signed)
[facility] faxed stating pt was Tramadol 50mg  called in for 90 days worth. Used to getting 7 day supply. [facility] on [address]. Marissa Oliver

## 2016-06-02 NOTE — Telephone Encounter (Signed)
I can't c/t filling this unfortunately. This is not meant to be ongoing script.  If still having urinary related pains, needs to f/u with Urology.  This is a pain medication not meant to be ongoing/daily use.   I certainly can't fill a 90 day supply

## 2016-06-03 ENCOUNTER — Telehealth: Payer: Self-pay | Admitting: Medical

## 2016-06-03 NOTE — Telephone Encounter (Signed)
Bone density scan from  05/25/16 shows no major improvement.  I think its time to switch to something else instead of Fosamax.   Options include once monthly Boniva tablet, once yearly Reclast infusion done at day hospital, or a shot every 6 months called Prolia.   I would recommend we do once yearly Reclast if insurance will approve this.    See if she has a preference?    Whatever we use, make sure she is also using Vit D either prescription OTC 2000 IU daily  Needs to be getting some exercise, get 4 servings of dairy daily.  The plan would be to repeat bone density scan in 2 years on a different medication.

## 2016-06-03 NOTE — Telephone Encounter (Signed)
Try to hunt back through and see how long she has taken Fosamax or when we started it?  If she has only been on it consistently for a year, then we can c/t fosamax another year or 2.    The radiologist called me back and said she is stable to some extent, just not all that improved.

## 2016-06-03 NOTE — Telephone Encounter (Signed)
Have her call insurance then and see what insurance will cover besides boniva and fosamax then.  The fosamax isn't working all that well

## 2016-06-03 NOTE — Telephone Encounter (Signed)
Pts insurance doesn't cover Prolia and pt can not afford the Boniva. Stated that the reclast has to have medical necessity and she didn't inquire about that but wanted to just take Fosamax still but double her dose said she can not afford to do anything else

## 2016-06-03 NOTE — Telephone Encounter (Signed)
She said that she is not doing anything she is not worried about it

## 2016-06-03 NOTE — Telephone Encounter (Signed)
error 

## 2016-06-03 NOTE — Telephone Encounter (Signed)
Pt wants to call insurance before deciding what option she will take, will call back when she can. Is taking Vit D and will continue

## 2016-06-03 NOTE — Telephone Encounter (Signed)
She said no she will not do this

## 2016-06-03 NOTE — Telephone Encounter (Signed)
Pt is aware.  

## 2016-06-03 NOTE — Telephone Encounter (Signed)
We can't double fosamax.   I recommend we try to get Reclast approved. If agreeable lets do this.

## 2016-06-04 ENCOUNTER — Telehealth: Payer: Self-pay

## 2016-06-04 DIAGNOSIS — R208 Other disturbances of skin sensation: Secondary | ICD-10-CM

## 2016-06-04 NOTE — Telephone Encounter (Signed)
Pt has agreed to do Prolia and the Prolia rep is coming by today to assist me with getting the needed documentation

## 2016-06-04 NOTE — Telephone Encounter (Signed)
We may have discussed this, but I don't see any notes regarding a vein issue among the things discussed.   What is her concern?  I suspect varicose veins?   If that is the case, I don't recall them being bad.   The first recommendation would be to use daily compression hose either OTC or prescription, walking for exercise, and elevating legs in the evenings.   See if there is another specific concern.  We can refer pending the concern.

## 2016-06-04 NOTE — Telephone Encounter (Signed)
First time prescribed 08/12/11

## 2016-06-04 NOTE — Telephone Encounter (Signed)
Wants a referral to [facility], said that it was mentioned at her last visit back in 04/2016

## 2016-06-07 NOTE — Telephone Encounter (Signed)
Pt states that you and her discussed having an ABI at last visit and this is the reason for the referral. Is this something that you still want her to have.  Marissa Oliver December

## 2016-06-07 NOTE — Telephone Encounter (Signed)
This is a screen so please set up ABI/ankle brachial index at the vascular center for peripheral vascular disease screening and diagnosis of diabetes.

## 2016-06-08 NOTE — Telephone Encounter (Signed)
Marissa Oliver the ABI can not be done just because she has diabetes ins will not cover it

## 2016-06-08 NOTE — Telephone Encounter (Signed)
Not sure how to put in order, check with [place] or [place]

## 2016-06-08 NOTE — Telephone Encounter (Signed)
Pt inform of this. Please enter order, I had issues finding it.   Thanks, RLB

## 2016-06-09 NOTE — Telephone Encounter (Signed)
Use diagnosis codes diabetes and decreased lower extremity pulses.

## 2016-06-09 NOTE — Addendum Note (Signed)
Addended by: Minette Headland A on: 06/09/2016 08:50 AM   Modules accepted: Orders

## 2016-06-09 NOTE — Telephone Encounter (Signed)
Wells Guiles, I have placed the order in, please call to get this scheduled?

## 2016-06-14 ENCOUNTER — Telehealth: Payer: Self-pay

## 2016-06-14 ENCOUNTER — Encounter (HOSPITAL_COMMUNITY): Payer: Medicare HMO

## 2016-06-14 NOTE — Telephone Encounter (Signed)
Got a call about a prior authorization form that was said to be faxed on Friday in regards to Prolia. The lady's name was Karena Addison and her call back number is [phone] ext. [phone]

## 2016-06-17 ENCOUNTER — Encounter (HOSPITAL_COMMUNITY): Payer: Medicare HMO

## 2016-06-18 ENCOUNTER — Telehealth: Payer: Self-pay

## 2016-06-18 ENCOUNTER — Telehealth: Payer: Self-pay | Admitting: Medical

## 2016-06-18 NOTE — Telephone Encounter (Signed)
Called [employer] T# [phone] site # [phone] PPO# [phone] and initiated P.A. PROLIA, said we will receive response via fax

## 2016-06-18 NOTE — Telephone Encounter (Signed)
Left message with brother to have pt call, need inform initiated P.A. Prolia & waiting response

## 2016-06-18 NOTE — Telephone Encounter (Signed)
Pt called and left a message on my voicemail, she wants an update on the status of her Prolia.

## 2016-06-21 ENCOUNTER — Ambulatory Visit (HOSPITAL_COMMUNITY)
Admission: RE | Admit: 2016-06-21 | Discharge: 2016-06-21 | Disposition: A | Discharge status: Home / Self Care | Payer: Medicare HMO | Source: Ambulatory Visit | Attending: Surgery | Admitting: Surgery

## 2016-06-21 DIAGNOSIS — E785 Hyperlipidemia, unspecified: Secondary | ICD-10-CM | POA: Insufficient documentation

## 2016-06-21 DIAGNOSIS — E119 Type 2 diabetes mellitus without complications: Secondary | ICD-10-CM | POA: Insufficient documentation

## 2016-06-21 DIAGNOSIS — R208 Other disturbances of skin sensation: Secondary | ICD-10-CM

## 2016-06-21 DIAGNOSIS — Z720 Tobacco use: Secondary | ICD-10-CM | POA: Diagnosis not present

## 2016-06-22 ENCOUNTER — Telehealth: Payer: Self-pay | Admitting: Family Medicine

## 2016-06-22 NOTE — Telephone Encounter (Signed)
Gabriel Cirri with Prolia called and states you need to call [employer] at [phone]   Re: [id number]  She advised Holland Falling is the only one that can give you that info.

## 2016-06-24 NOTE — Telephone Encounter (Signed)
Called [employer] t# [phone] and [place] is covered & I called [place] services t# [phone] & pt's co pay will be $300.  Left message for Marissa Oliver Prolia Rep T# [phone] to see if there are co pay assistance programs.  Went only and Clinical biochemist.  Called pt and she is going to come by and complete

## 2016-06-25 NOTE — Telephone Encounter (Signed)
PA Faxed

## 2016-06-28 ENCOUNTER — Telehealth: Payer: Self-pay | Admitting: Medical

## 2016-06-28 NOTE — Telephone Encounter (Signed)
Pt is aware.  

## 2016-06-28 NOTE — Telephone Encounter (Signed)
Pt called wanting to make sure that Audelia Acton reviews her vein ultrasound that she had done last week that is in her chart. Pt said to call her on her cell # if she is not home when we call to discuss the test

## 2016-06-28 NOTE — Telephone Encounter (Signed)
Happy to report that the leg ABI studies were normal

## 2016-07-06 ENCOUNTER — Telehealth: Payer: Self-pay | Admitting: Medical

## 2016-07-06 NOTE — Telephone Encounter (Signed)
Pt requesting status update on approval for shot with her insurance

## 2016-07-06 NOTE — Telephone Encounter (Signed)
Additional form for P.A. Received and put in Shane's folder for completion.  I printed clinical notes to include  Left message for pt advising we are still working on this

## 2016-07-13 NOTE — Telephone Encounter (Signed)
Additional form completed & faxed with office notes to [place]

## 2016-07-19 NOTE — Telephone Encounter (Signed)
Recv'd fax stating they have received and are processing application.  Called & informed pt

## 2016-07-23 NOTE — Telephone Encounter (Signed)
See msg

## 2016-07-23 NOTE — Telephone Encounter (Signed)
Recv'd approval for Marissa Oliver please order from pharmacy.  Pt informed and we will let her know when it's received

## 2016-07-26 NOTE — Telephone Encounter (Signed)
Prolia ordered

## 2016-07-29 NOTE — Telephone Encounter (Signed)
Pt informed & she will come by tomorrow for Prolia injection

## 2016-07-30 ENCOUNTER — Other Ambulatory Visit (INDEPENDENT_AMBULATORY_CARE_PROVIDER_SITE_OTHER): Payer: Medicare HMO

## 2016-07-30 DIAGNOSIS — M810 Age-related osteoporosis without current pathological fracture: Secondary | ICD-10-CM | POA: Diagnosis not present

## 2016-07-30 DIAGNOSIS — Z23 Encounter for immunization: Secondary | ICD-10-CM

## 2016-07-30 MED ORDER — DENOSUMAB 60 MG/ML ~~LOC~~ SOLN
60.0000 mg | Freq: Once | SUBCUTANEOUS | Status: AC
  Administered 2016-07-30: 60 mg via SUBCUTANEOUS

## 2016-08-17 DIAGNOSIS — K601 Chronic anal fissure: Secondary | ICD-10-CM | POA: Diagnosis not present

## 2016-08-17 DIAGNOSIS — K5730 Diverticulosis of large intestine without perforation or abscess without bleeding: Secondary | ICD-10-CM | POA: Diagnosis not present

## 2016-08-17 DIAGNOSIS — K6289 Other specified diseases of anus and rectum: Secondary | ICD-10-CM | POA: Diagnosis not present

## 2016-09-05 DIAGNOSIS — R69 Illness, unspecified: Secondary | ICD-10-CM | POA: Diagnosis not present

## 2016-09-07 ENCOUNTER — Telehealth: Payer: Self-pay | Admitting: Medical

## 2016-09-07 NOTE — Telephone Encounter (Signed)
Spent 57 minutes on the phone with [employer] regarding pt's bill of $168, they are faxing EOB & I am to fax the coverage summary to # [phone] Attn Claims Dept

## 2016-09-08 NOTE — Telephone Encounter (Signed)
Pt informed

## 2016-09-09 NOTE — Telephone Encounter (Signed)
Spoke with pharm rep & he will look into situation with insurance and Prolia,  Sent message to pt, need EOB

## 2016-09-15 ENCOUNTER — Ambulatory Visit (INDEPENDENT_AMBULATORY_CARE_PROVIDER_SITE_OTHER): Payer: Medicare HMO | Admitting: Medical

## 2016-09-15 VITALS — BP 117/60 | HR 68 | Temp 97.8°F | Wt 170.0 lb

## 2016-09-15 DIAGNOSIS — R300 Dysuria: Secondary | ICD-10-CM

## 2016-09-15 LAB — POCT URINALYSIS DIPSTICK
BILIRUBIN UA: NEGATIVE
Glucose, UA: NEGATIVE
Ketones, UA: NEGATIVE
Leukocytes, UA: NEGATIVE
NITRITE UA: NEGATIVE
PH UA: 6
PROTEIN UA: NEGATIVE
RBC UA: NEGATIVE
Spec Grav, UA: 1.02
UROBILINOGEN UA: NEGATIVE

## 2016-09-15 MED ORDER — CIPROFLOXACIN HCL 500 MG PO TABS: 500.0000 mg | ORAL_TABLET | Freq: Two times a day (BID) | ORAL | 0 refills | Status: DC

## 2016-09-15 NOTE — Progress Notes (Signed)
Subjective:  Marissa Oliver is a 75 y.o. female who complains of possible urinary tract infection.  She notes 3 day hx/o burning with urination.   Heading to [place] for thanksgiving, wants to make sure she is ok.  Gets some urinary pressure, but has vaginal dryness, so not sure if its just dryness.  No blood in urine.  Urine has strong odor.   No urgency or frequency.  No vaginal discharge.  No vaginal itching.  Not eating or drinking more sugary things of late.   Glucose been in the 130s recently.   Drinking some caffeine, maybe more than she should.  Drinks 1 coke daily.  No tea.   No coffee.  No back pain.   Using the premarin cream twice weekly by gynecology.   no fever, no NVD. No other aggravating or relieving factors. No other complaint.  Past Medical History:  Diagnosis Date  . Atrophic vaginitis    estrace cream rx'd by Dr. Karsten Ro  . Colon polyp    tubular adenoma (colonoscopy q5 yrs)  . Diabetes mellitus 2005  . Diverticulosis   . Dyslipidemia   . GAD (generalized anxiety disorder)   . Hemorrhoids   . OAB (overactive bladder)   . Osteoporosis   . Psoriasis    mild, sees [facility]  . Smoker     ROS as in subjective  Reviewed allergies, medications, past medical, surgical, and social history.    Objective: BP 117/60   Pulse 68   Temp 97.8 F (36.6 C)   Wt 170 lb (77.1 kg)   SpO2 98%   BMI 31.09 kg/m   BP Readings from Last 3 Encounters:  09/15/16 117/60  04/29/16 120/70  04/26/16 128/76   General appearance: alert, no distress, WD/WN, female Abdomen: +bs, soft, non tender, non distended, no masses, no hepatomegaly, no splenomegaly Back: no CVA tenderness GU: deferred      Assessment: Encounter Diagnoses  Name Primary?  . Burning with urination Yes  . Dysuria       Plan: Reviewed prior cultures, mostly Klebsiella with minimal prior resistances.   Discussed symptoms, diagnosis, possible complications, and usual course of illness. Begin  medication Cipro 3-5 days per orders below.  Advised increased water intake, can use OTC Tylenol for pain.   Call or return if worse or not improving.    F/u in the next month for routine med check.

## 2016-09-20 NOTE — Telephone Encounter (Signed)
Pharmaceutical company still working on issue with billing

## 2016-09-22 DIAGNOSIS — R05 Cough: Secondary | ICD-10-CM | POA: Diagnosis not present

## 2016-09-22 DIAGNOSIS — J069 Acute upper respiratory infection, unspecified: Secondary | ICD-10-CM | POA: Diagnosis not present

## 2016-09-28 ENCOUNTER — Encounter: Payer: Self-pay | Admitting: Medical

## 2016-09-28 ENCOUNTER — Other Ambulatory Visit: Payer: Medicare HMO

## 2016-09-28 ENCOUNTER — Ambulatory Visit (INDEPENDENT_AMBULATORY_CARE_PROVIDER_SITE_OTHER): Payer: Medicare HMO | Admitting: Medical

## 2016-09-28 VITALS — BP 124/60 | HR 69 | Temp 98.0°F | Wt 173.4 lb

## 2016-09-28 DIAGNOSIS — J988 Other specified respiratory disorders: Secondary | ICD-10-CM | POA: Diagnosis not present

## 2016-09-28 DIAGNOSIS — R69 Illness, unspecified: Secondary | ICD-10-CM | POA: Diagnosis not present

## 2016-09-28 DIAGNOSIS — E118 Type 2 diabetes mellitus with unspecified complications: Secondary | ICD-10-CM | POA: Diagnosis not present

## 2016-09-28 DIAGNOSIS — F17200 Nicotine dependence, unspecified, uncomplicated: Secondary | ICD-10-CM | POA: Diagnosis not present

## 2016-09-28 DIAGNOSIS — E559 Vitamin D deficiency, unspecified: Secondary | ICD-10-CM

## 2016-09-28 DIAGNOSIS — E785 Hyperlipidemia, unspecified: Secondary | ICD-10-CM | POA: Diagnosis not present

## 2016-09-28 LAB — COMPREHENSIVE METABOLIC PANEL
ALT: 16 U/L (ref 6–29)
AST: 12 U/L (ref 10–35)
Albumin: 4.2 g/dL (ref 3.6–5.1)
Alkaline Phosphatase: 62 U/L (ref 33–130)
BUN: 17 mg/dL (ref 7–25)
CALCIUM: 9.2 mg/dL (ref 8.6–10.4)
CHLORIDE: 105 mmol/L (ref 98–110)
CO2: 21 mmol/L (ref 20–31)
Creat: 0.7 mg/dL (ref 0.60–0.93)
GLUCOSE: 248 mg/dL — AB (ref 65–99)
POTASSIUM: 4.2 mmol/L (ref 3.5–5.3)
Sodium: 135 mmol/L (ref 135–146)
Total Bilirubin: 0.3 mg/dL (ref 0.2–1.2)
Total Protein: 6.6 g/dL (ref 6.1–8.1)

## 2016-09-28 LAB — CBC WITH DIFFERENTIAL/PLATELET
BASOS ABS: 0 {cells}/uL (ref 0–200)
BASOS PCT: 0 %
EOS ABS: 116 {cells}/uL (ref 15–500)
Eosinophils Relative: 2 %
HCT: 36.1 % (ref 35.0–45.0)
HEMOGLOBIN: 12.2 g/dL (ref 11.7–15.5)
LYMPHS ABS: 2030 {cells}/uL (ref 850–3900)
Lymphocytes Relative: 35 %
MCH: 29.8 pg (ref 27.0–33.0)
MCHC: 33.8 g/dL (ref 32.0–36.0)
MCV: 88 fL (ref 80.0–100.0)
MPV: 11 fL (ref 7.5–12.5)
Monocytes Absolute: 406 cells/uL (ref 200–950)
Monocytes Relative: 7 %
NEUTROS ABS: 3248 {cells}/uL (ref 1500–7800)
Neutrophils Relative %: 56 %
Platelets: 167 10*3/uL (ref 140–400)
RBC: 4.1 MIL/uL (ref 3.80–5.10)
RDW: 13.5 % (ref 11.0–15.0)
WBC: 5.8 10*3/uL (ref 4.0–10.5)

## 2016-09-28 MED ORDER — PREDNISONE 10 MG PO TABS: ORAL_TABLET | ORAL | 0 refills | Status: DC

## 2016-09-28 NOTE — Progress Notes (Signed)
Subjective Chief Complaint  Patient presents with  . coughing    coughing , chest congestion    Here for the lab work we discuss last visit and f/u on recent respiratory tract infection.  Was down in [place] for thanksgiving, got sick with cough, green sputum, not feeling well, wheezing. hasn't smoked in 7 days.   09/22/16 saw urgent care, given zpak and tessalon pPerlesfor rrespiratorytract infection.   Although she wasn't scheduled for a med check, here for labs for diabetes, lipids, routine medication.  Checking sugars sometimes.  Checking feet daily.    Past Medical History:  Diagnosis Date  . Atrophic vaginitis    estrace cream rx'd by Dr. Karsten Ro  . Colon polyp    tubular adenoma (colonoscopy q5 yrs)  . Diabetes mellitus 2005  . Diverticulosis   . Dyslipidemia   . GAD (generalized anxiety disorder)   . Hemorrhoids   . OAB (overactive bladder)   . Osteoporosis   . Psoriasis    mild, sees [facility]  . Smoker    Current Outpatient Prescriptions on File Prior to Visit  Medication Sig Dispense Refill  . conjugated estrogens (PREMARIN) vaginal cream Insert 0.5 grams vaginally at bedtime nightly x 1 week then twice weekly 42.5 g 11  . lisinopril (PRINIVIL,ZESTRIL) 5 MG tablet Take 1 tablet (5 mg total) by mouth daily. 90 tablet 3  . metFORMIN (GLUCOPHAGE-XR) 500 MG 24 hr tablet Take 1 tablet (500 mg total) by mouth daily with breakfast. 90 tablet 3  . ONE TOUCH ULTRA TEST test strip USE TO CHECK BLOOD SUGAR ONCE DAILY 100 each 3  . pravastatin (PRAVACHOL) 40 MG tablet Take 1 tablet (40 mg total) by mouth daily. 90 tablet 3  . Vitamin D, Ergocalciferol, (DRISDOL) 50000 units CAPS capsule TAKE 1 CAPSULE BY MOUTH ONCE A WEEK 12 capsule 0   No current facility-administered medications on file prior to visit.    ROS as in subjective   Objective: BP 124/60   Pulse 69   Temp 98 F (36.7 C)   Wt 173 lb 6.4 oz (78.7 kg)   SpO2 96%   BMI 31.72 kg/m   General  appearance: Alert, WD/WN, no distress                             Skin: warm, no rash, no diaphoresis                           Head: no sinus tenderness                            Eyes: conjunctiva normal, corneas clear, PERRLA                            Ears: pearly TMs, external ear canals normal                          Nose: septum midline, turbinates swollen, with erythema and clear discharge             Mouth/throat: MMM, tongue normal, mild pharyngeal erythema                           Neck: supple, no adenopathy, no thyromegaly, non tender  Heart: RRR, normal S1, S2, no murmurs                         Lungs: +bronchial breath sounds, +scattered rhonchi, no wheezes, no rales                Extremities: no edema, non tender      Assessment: Encounter Diagnoses  Name Primary?  . Type 2 diabetes mellitus with complication, without long-term current use of insulin ([place]) Yes  . Dyslipidemia   . Vitamin D deficiency   . Tobacco use disorder   . Respiratory tract infection      Plan: Reviewed her recent urgent care discharge report.   Add prednisone a few days to calm down symptoms.   Can use Mucinex DM OTC.  Rest, hydrate well.  Call if not much improved by end of the week.  Routine labs today, advised yearly eye exam, daily foot checks, glucose monitoring at home, diabetic diet, routine walking for exercise.  C/t current medications.   Laurelle was seen today for coughing.  Diagnoses and all orders for this visit:  Type 2 diabetes mellitus with complication, without long-term current use of insulin (HCC) -     Comprehensive metabolic panel -     Hemoglobin A1c  Dyslipidemia -     Comprehensive metabolic panel  Vitamin D deficiency -     VITAMIN D 25 Hydroxy (Vit-D Deficiency, Fractures)  Tobacco use disorder -     CBC with Differential/Platelet  Respiratory tract infection -     CBC with Differential/Platelet  Other orders -     predniSONE  (DELTASONE) 10 MG tablet; 3 tablets daily for 3 days

## 2016-09-29 ENCOUNTER — Other Ambulatory Visit: Payer: Self-pay | Admitting: Medical

## 2016-09-29 LAB — VITAMIN D 25 HYDROXY (VIT D DEFICIENCY, FRACTURES): VIT D 25 HYDROXY: 43 ng/mL (ref 30–100)

## 2016-09-29 LAB — HEMOGLOBIN A1C
HEMOGLOBIN A1C: 7.9 % — AB (ref <5.7)
Mean Plasma Glucose: 180 mg/dL

## 2016-09-29 MED ORDER — VITAMIN D (ERGOCALCIFEROL) 1.25 MG (50000 UNIT) PO CAPS: 50000.0000 [IU] | ORAL_CAPSULE | ORAL | 3 refills | Status: DC

## 2016-09-29 MED ORDER — INSULIN PEN NEEDLE 32G X 4 MM MISC: 1.0000 | Freq: Every day | 11 refills | Status: DC

## 2016-09-29 MED ORDER — METFORMIN HCL ER 750 MG PO TB24: 750.0000 mg | ORAL_TABLET | Freq: Every day | ORAL | 1 refills | Status: DC

## 2016-09-29 MED ORDER — INSULIN GLARGINE 100 UNIT/ML SOLOSTAR PEN: 10.0000 [IU] | PEN_INJECTOR | Freq: Every day | SUBCUTANEOUS | 5 refills | Status: DC

## 2016-10-01 ENCOUNTER — Other Ambulatory Visit: Payer: Self-pay | Admitting: Medical

## 2016-10-01 ENCOUNTER — Telehealth: Payer: Self-pay | Admitting: Medical

## 2016-10-01 MED ORDER — INSULIN DETEMIR 100 UNIT/ML FLEXPEN
10.0000 [IU] | PEN_INJECTOR | Freq: Every day | SUBCUTANEOUS | 11 refills | Status: DC
Start: 2016-10-01 — End: 2017-02-16

## 2016-10-01 NOTE — Telephone Encounter (Signed)
Pt called and stated that her insurance company will pay for Levemir. She would like a 30 DAY RX ONLY. She would like to make sure it works before she gets any more filled. Please fill ASAP. Sending high priority.

## 2016-10-01 NOTE — Telephone Encounter (Signed)
Called patient and gave her this info.

## 2016-10-01 NOTE — Telephone Encounter (Signed)
I sent Levemir but again, start with 3 units QHS the first week, then go up to 5 units week 2 if sugars over 150 fasting.   If sugars under 150 fasting in the morning, then just use 3 units QHS  F/u 4-6 wk

## 2016-10-02 ENCOUNTER — Telehealth: Payer: Self-pay | Admitting: Medical

## 2016-10-02 NOTE — Telephone Encounter (Signed)
Recv'd fax stating plan does not cover Lantus try Levemir or Tyler Aas, do you want to switch?

## 2016-10-04 ENCOUNTER — Other Ambulatory Visit: Payer: Self-pay | Admitting: Medical

## 2016-10-04 NOTE — Telephone Encounter (Signed)
I had already sent Levemir last week as alternate.  So what is the status?  Did she get the Levemir I already sent last week or has she not started either?  I'm confused.

## 2016-10-04 NOTE — Telephone Encounter (Signed)
Pt called and had question about her insulin , when over  Information on how to take her insulin. Pt understood.

## 2016-10-07 NOTE — Telephone Encounter (Signed)
Called pharmacy & pt got the Levemir for $47 & called pt & she is taking the Levemir

## 2016-10-12 NOTE — Telephone Encounter (Signed)
Dan with [employer] called & [facility] [employer] has grant money available that should help with the balance that insurance didn't pay, pt needs to call t# [phone]. Called pt and she will call & do application

## 2016-10-13 ENCOUNTER — Ambulatory Visit (INDEPENDENT_AMBULATORY_CARE_PROVIDER_SITE_OTHER): Payer: Medicare HMO | Admitting: Medical

## 2016-10-13 ENCOUNTER — Encounter: Payer: Self-pay | Admitting: Medical

## 2016-10-13 VITALS — BP 160/70 | HR 75 | Wt 169.0 lb

## 2016-10-13 DIAGNOSIS — Z23 Encounter for immunization: Secondary | ICD-10-CM

## 2016-10-13 DIAGNOSIS — J069 Acute upper respiratory infection, unspecified: Secondary | ICD-10-CM

## 2016-10-13 DIAGNOSIS — Z598 Other problems related to housing and economic circumstances: Secondary | ICD-10-CM | POA: Diagnosis not present

## 2016-10-13 DIAGNOSIS — Z599 Problem related to housing and economic circumstances, unspecified: Secondary | ICD-10-CM

## 2016-10-13 NOTE — Progress Notes (Signed)
Subjective:  Marissa Oliver is a 75 y.o. female who presents for respiratory illness.  Has had cough, runny nose, congestion in head and throat, mild sore throat, x few days.  No fever, no wheezing, no SOB, no NVD, no productive cough, no rash.  Brother was sick recently but no other sick contacts.   Using nothing for symptoms.  Past history is significant for diabetes.   Patient is not a smoker.  No other aggravating or relieving factors.    She is frustrated by getting a bill for what was more than quoted for recent prolia injection. Is working with Mickel Baas here on the billing issues.   No other c/o.  Past Medical History:  Diagnosis Date  . Atrophic vaginitis    estrace cream rx'd by Dr. Karsten Ro  . Colon polyp    tubular adenoma (colonoscopy q5 yrs)  . Diabetes mellitus 2005  . Diverticulosis   . Dyslipidemia   . GAD (generalized anxiety disorder)   . Hemorrhoids   . OAB (overactive bladder)   . Osteoporosis   . Psoriasis    mild, sees [facility]  . Smoker     ROS as in subjective   Objective: BP (!) 160/70   Pulse 75   Wt 169 lb (76.7 kg)   SpO2 98%   BMI 30.91 kg/m   General appearance: Alert, WD/WN, no distress, mildly ill appearing                             Skin: warm, no rash                           Head: no sinus tenderness                            Eyes: conjunctiva normal, corneas clear, PERRLA                            Ears: pearly TMs, external ear canals normal                          Nose: septum midline, turbinates swollen, with erythema and clear discharge             Mouth/throat: MMM, tongue normal, mild pharyngeal erythema                           Neck: supple, no adenopathy, no thyromegaly, non tender                          Heart: RRR, normal S1, S2, no murmurs                         Lungs: CTA bilaterally, no wheezes, rales, or rhonchi      Assessment  Encounter Diagnoses  Name Primary?  . Acute upper respiratory infection  Yes  . Financial difficulties       Plan: Discussed symptoms, exam, suggestive of viral URI.   Specific home care recommendations today include:  Only take over-the-counter (OTC) or prescription medicines for pain, discomfort, or fever as directed by your caregiver.    Decongestant: You may use OTC Guaifenesin (Mucinex plain) for congestion.  You may use Pseudoephedrine (Sudafed) only if you don't have blood pressure problems or a diagnosis of hypertension.  Cough suppression: If you have cough from drainage, you may use over-the-counter Dextromethorphan (Delsym) as directed on the label  Sore throat remedies:  You may use salt water gargles, warm fluids such as coffee or hot tea, or honey/tea/lemon mixture to sooth sore throat pain.  You may use OTC sore throat remedies such as Cepacol lozenges or Chloraseptic spray for sore throat pain.  Runny nose and sneezing remedies: You may use OTC antihistamine such as Zyrtec or Benadryl, but caution as these can cause drowsiness.    Pain/fever relief: You may use over-the-counter Tylenol for pain or fever  Drink extra fluids. Fluids help thin the mucus so your sinuses can drain more easily.   Applying either moist heat or ice packs to the sinus areas may help relieve discomfort.  Use saline nasal sprays to help moisten your sinuses. The sprays can be found at your local drugstore.   Patient was advised to call or return if worse or not improving in the next few days.    Patient voiced understanding of diagnosis, recommendations, and treatment plan.  I will talk to Mickel Baas about her prolia bill.

## 2016-11-20 DIAGNOSIS — R69 Illness, unspecified: Secondary | ICD-10-CM | POA: Diagnosis not present

## 2016-12-08 ENCOUNTER — Ambulatory Visit: Payer: Medicare HMO | Admitting: Physician Assistant

## 2016-12-09 ENCOUNTER — Ambulatory Visit (INDEPENDENT_AMBULATORY_CARE_PROVIDER_SITE_OTHER): Payer: Medicare HMO | Admitting: Medical

## 2016-12-09 ENCOUNTER — Encounter: Payer: Self-pay | Admitting: Medical

## 2016-12-09 VITALS — BP 124/70 | HR 71 | Wt 175.0 lb

## 2016-12-09 DIAGNOSIS — R300 Dysuria: Secondary | ICD-10-CM

## 2016-12-09 DIAGNOSIS — Z87891 Personal history of nicotine dependence: Secondary | ICD-10-CM

## 2016-12-09 DIAGNOSIS — R350 Frequency of micturition: Secondary | ICD-10-CM | POA: Insufficient documentation

## 2016-12-09 DIAGNOSIS — L089 Local infection of the skin and subcutaneous tissue, unspecified: Secondary | ICD-10-CM | POA: Insufficient documentation

## 2016-12-09 DIAGNOSIS — R195 Other fecal abnormalities: Secondary | ICD-10-CM | POA: Diagnosis not present

## 2016-12-09 DIAGNOSIS — N952 Postmenopausal atrophic vaginitis: Secondary | ICD-10-CM | POA: Diagnosis not present

## 2016-12-09 LAB — POCT URINALYSIS DIPSTICK
BILIRUBIN UA: NEGATIVE
Glucose, UA: NEGATIVE
KETONES UA: NEGATIVE
LEUKOCYTES UA: NEGATIVE
Nitrite, UA: NEGATIVE
Protein, UA: NEGATIVE
SPEC GRAV UA: 1.025
Urobilinogen, UA: NEGATIVE
pH, UA: 6

## 2016-12-09 LAB — POCT UA - MICROSCOPIC ONLY: RBC, urine, microscopic: 2

## 2016-12-09 NOTE — Progress Notes (Signed)
Subjective: Chief Complaint  Patient presents with  . burning with urine    burning with urine    Here with burning with urination x 3-4 days.   No urinary frequency, not having urgency.  Feeling a little tired.   No fever.   No vaginal discharge.   If she doesn't go right when she feels the urge, can get some urinary incontinence.Marland Kitchen  Has hx/o overactive bladder, occasional UTI, atrophic vaginitis.  No vaginal discharge, no itching.   Using premarin vaginal cream twice weekly.   Has had some diarrhea intermittent, thinks it could be related to diet.   Been on metformin a long time, wonders if this is related to the metformin.  Taking Metformin once daily, 750mg  XR.  Still has a whole bottle of 500mg  Metformin left.  Has had some rectal pain recently.  Went to doctor last year about pain in rectum. Had fissure.     Has new pimple like lesion on nose that just popped up.    hasn't smoked in 3 weeks.    Past Medical History:  Diagnosis Date  . Atrophic vaginitis    estrace cream rx'd by Dr. Karsten Ro  . Colon polyp    tubular adenoma (colonoscopy q5 yrs)  . Diabetes mellitus 2005  . Diverticulosis   . Dyslipidemia   . GAD (generalized anxiety disorder)   . Hemorrhoids   . OAB (overactive bladder)   . Osteoporosis   . Psoriasis    mild, sees [facility]  . Smoker    Current Outpatient Prescriptions on File Prior to Visit  Medication Sig Dispense Refill  . conjugated estrogens (PREMARIN) vaginal cream Insert 0.5 grams vaginally at bedtime nightly x 1 week then twice weekly 42.5 g 11  . Insulin Detemir (LEVEMIR FLEXPEN) 100 UNIT/ML Pen Inject 10 Units into the skin daily at 10 pm. 15 mL 11  . Insulin Glargine (LANTUS SOLOSTAR) 100 UNIT/ML Solostar Pen Inject 10 Units into the skin daily at 10 pm. 15 mL 5  . Insulin Pen Needle (BD PEN NEEDLE NANO U/F) 32G X 4 MM MISC 1 each by Does not apply route daily. 100 each 11  . lisinopril (PRINIVIL,ZESTRIL) 5 MG tablet Take 1 tablet (5 mg  total) by mouth daily. 90 tablet 3  . metFORMIN (GLUCOPHAGE XR) 750 MG 24 hr tablet Take 1 tablet (750 mg total) by mouth daily with breakfast. 90 tablet 1  . ONE TOUCH ULTRA TEST test strip USE TO CHECK BLOOD SUGAR ONCE DAILY 100 each 3  . pravastatin (PRAVACHOL) 40 MG tablet Take 1 tablet (40 mg total) by mouth daily. 90 tablet 3  . Vitamin D, Ergocalciferol, (DRISDOL) 50000 units CAPS capsule Take 1 capsule (50,000 Units total) by mouth once a week. 12 capsule 3   No current facility-administered medications on file prior to visit.    ROS as in subjective   Objective: BP 124/70   Pulse 71   Wt 175 lb (79.4 kg)   SpO2 97%   BMI 32.01 kg/m   Gen: wd, wn, nad Skin: tip of nose with 39mm raised erythematous pustule Abdomen: +bs, soft, nontender, no mass, no organomegaly No CVA tenderness    Assessment: Encounter Diagnoses  Name Primary?  . Urine frequency Yes  . Dysuria   . Pustule   . Loose stools   . Atrophic vaginitis   . Former smoker      Plan: Urine frequency, dysuria - will send urine for culture  Pustule of nose-  advised good hygiene, begin neosporin OTC daily, and if worse or not resolved within a week, then recheck  Atrophic vaginitis - c/t premarin cream 2 times per week  Loose stool - cut back to the Metformin 500mg  Xl daily for the next few weeks to see if this helps improve the loose stool.  Call back and let me know  Glad to hear she stopped tobacco.   Try to remain tobacco free.   Marissa Oliver was seen today for burning with urine.  Diagnoses and all orders for this visit:  Urine frequency -     Urinalysis Dipstick -     Urine culture -     POCT UA - Microscopic Only  Dysuria -     Urine culture  Pustule  Loose stools  Atrophic vaginitis  Former smoker

## 2016-12-11 LAB — URINE CULTURE

## 2016-12-13 ENCOUNTER — Other Ambulatory Visit: Payer: Self-pay | Admitting: Medical

## 2016-12-13 ENCOUNTER — Encounter: Payer: Self-pay | Admitting: Medical

## 2016-12-13 ENCOUNTER — Telehealth: Payer: Self-pay | Admitting: Medical

## 2016-12-13 MED ORDER — TRAMADOL HCL 50 MG PO TABS: 50.0000 mg | ORAL_TABLET | Freq: Four times a day (QID) | ORAL | 0 refills | Status: DC | PRN

## 2016-12-13 MED ORDER — CEPHALEXIN 500 MG PO CAPS: 500.0000 mg | ORAL_CAPSULE | Freq: Three times a day (TID) | ORAL | 0 refills | Status: DC

## 2016-12-13 NOTE — Telephone Encounter (Signed)
Called in rx to pharmacy.

## 2016-12-13 NOTE — Telephone Encounter (Signed)
Pls call out Ultram for pain, #20, no refill

## 2016-12-13 NOTE — Telephone Encounter (Signed)
I emailed her to go ahead with Keflex, but hydrate well!  Call out Ultram for pain

## 2016-12-13 NOTE — Telephone Encounter (Signed)
Pt states she was reading up on the Cephalexin and it cautioned upon taking it with Metformin and she wants to know if she should hold off on taking the Metformin while she is on the Cephalexin?  Please call pt and advise

## 2016-12-15 ENCOUNTER — Encounter: Payer: Self-pay | Admitting: Medical

## 2016-12-20 ENCOUNTER — Encounter: Payer: Self-pay | Admitting: Physician Assistant

## 2016-12-20 ENCOUNTER — Ambulatory Visit (INDEPENDENT_AMBULATORY_CARE_PROVIDER_SITE_OTHER): Payer: Medicare HMO | Admitting: Physician Assistant

## 2016-12-20 VITALS — BP 130/52 | HR 70 | Ht 61.0 in | Wt 174.4 lb

## 2016-12-20 DIAGNOSIS — E785 Hyperlipidemia, unspecified: Secondary | ICD-10-CM

## 2016-12-20 DIAGNOSIS — I10 Essential (primary) hypertension: Secondary | ICD-10-CM | POA: Diagnosis not present

## 2016-12-20 NOTE — Progress Notes (Signed)
Cardiology Office Note:    Date:  12/20/2016   ID:  Marissa Oliver, DOB 03/24/1941, MRN [id number]  PCP:  Crisoforo Oxford, PA-C  Cardiologist:  Dr. Liam Rogers   Electrophysiologist:  n/a  Referring MD: Carlena Hurl, PA-C   Chief Complaint  Patient presents with  . Follow-up    HTN    History of Present Illness:    Marissa Oliver is a 76 y.o. female with a hx of DM, HTN, HL, tobacco abuse.  She was last seen by Dr. Liam Rogers in 5/15.  She returns for follow up.  Her brother also sees Dr. Liam Rogers.  He was recently quite ill with congestive heart failure and she felt she should come in for follow up.  She is doing well.  She finally quit smoking. She feels her chronic dyspnea on exertion is actually improved.  She denies chest pain, orthopnea, PND, edema, syncope.    Prior CV studies:   The following studies were reviewed today:  Myoview 2/06 EF 65, no ischemia  Past Medical History:  Diagnosis Date  . Atrophic vaginitis    estrace cream rx'd by Dr. Karsten Ro  . Colon polyp    tubular adenoma (colonoscopy q5 yrs)  . Diabetes mellitus 2005  . Diverticulosis   . Dyslipidemia   . GAD (generalized anxiety disorder)   . Hemorrhoids   . OAB (overactive bladder)   . Osteoporosis   . Psoriasis    mild, sees [facility]  . Smoker     Past Surgical History:  Procedure Laterality Date  . ABDOMINAL HYSTERECTOMY  1972   partial  . APPENDECTOMY    . CARDIOVASCULAR STRESS TEST  12/25/2004   EF 65%. OVERALL NORMAL STRESS CARDIOLITE. NO EVIDENCE  OF ISCHEMIA  . COLONOSCOPY  08/2010   cologuard 01/2016 normal  . PUBOVAGINAL SLING  04/1998  . TUBAL LIGATION      Current Medications: Current Meds  Medication Sig  . cephALEXin (KEFLEX) 500 MG capsule Take 1 capsule (500 mg total) by mouth 3 (three) times daily.  Marland Kitchen conjugated estrogens (PREMARIN) vaginal cream Insert 0.5 grams vaginally at bedtime nightly x 1 week then twice weekly  . Insulin Detemir (LEVEMIR  FLEXPEN) 100 UNIT/ML Pen Inject 10 Units into the skin daily at 10 pm.  . Insulin Glargine (LANTUS SOLOSTAR) 100 UNIT/ML Solostar Pen Inject 10 Units into the skin daily at 10 pm.  . Insulin Pen Needle (BD PEN NEEDLE NANO U/F) 32G X 4 MM MISC 1 each by Does not apply route daily.  Marland Kitchen lisinopril (PRINIVIL,ZESTRIL) 5 MG tablet Take 1 tablet (5 mg total) by mouth daily.  . metFORMIN (GLUCOPHAGE XR) 750 MG 24 hr tablet Take 1 tablet (750 mg total) by mouth daily with breakfast.  . ONE TOUCH ULTRA TEST test strip USE TO CHECK BLOOD SUGAR ONCE DAILY  . pravastatin (PRAVACHOL) 40 MG tablet Take 1 tablet (40 mg total) by mouth daily.  . traMADol (ULTRAM) 50 MG tablet Take 1 tablet (50 mg total) by mouth every 6 (six) hours as needed.  . Vitamin D, Ergocalciferol, (DRISDOL) 50000 units CAPS capsule Take 1 capsule (50,000 Units total) by mouth once a week.     Allergies:   Patient has no known allergies.   Social History   Social History  . Marital status: Divorced    Spouse name: N/A  . Number of children: N/A  . Years of education: N/A   Social History Main Topics  .  Smoking status: Former Smoker    Packs/day: 0.50    Quit date: 05/22/2012  . Smokeless tobacco: Never Used  . Alcohol use No  . Drug use: No  . Sexual activity: Not Currently   Other Topics Concern  . 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  Has 3 children.  Exercises some with walking.  As of 04/2016.     Family History  Problem Relation Age of Onset  . Arthritis Mother   . Heart failure Mother   . Mental illness Father   . Kidney disease Father   . Stroke Father   . Heart attack Father   . Heart disease Brother   . Cancer Brother     prostate  . Stroke Maternal Aunt   . Cancer Maternal Uncle   . Stroke Paternal Grandmother      ROS:   Please see the history of present illness.    ROS All other systems reviewed and are negative.   EKGs/Labs/Other Test  Reviewed:    EKG:  EKG is  ordered today.  The ekg ordered today demonstrates NSR, HR 70, normal axis, NSSTTW changes, QTc 423 ms, no change from prior tracings.   Recent Labs: 09/28/2016: ALT 16; BUN 17; Creat 0.70; Hemoglobin 12.2; Platelets 167; Potassium 4.2; Sodium 135   Recent Lipid Panel    Component Value Date/Time   CHOL 174 03/04/2016 0801   TRIG 201 (H) 03/04/2016 0801   HDL 45 (L) 03/04/2016 0801   CHOLHDL 3.9 03/04/2016 0801   VLDL 40 (H) 03/04/2016 0801   LDLCALC 89 03/04/2016 0801     Physical Exam:    VS:  BP (!) 130/52 (BP Location: Right Arm, Patient Position: Sitting, Cuff Size: Normal)   Pulse 70   Ht 5\' 1"  (1.549 m)   Wt 174 lb 6.4 oz (79.1 kg)   BMI 32.95 kg/m     Wt Readings from Last 3 Encounters:  12/20/16 174 lb 6.4 oz (79.1 kg)  12/09/16 175 lb (79.4 kg)  10/13/16 169 lb (76.7 kg)     Physical Exam  Constitutional: She is oriented to person, place, and time. She appears well-developed and well-nourished. No distress.  HENT:  Head: Normocephalic and atraumatic.  Eyes: No scleral icterus.  Neck: Normal range of motion. No JVD present. Carotid bruit is not present.  Cardiovascular: Normal rate, regular rhythm, S1 normal, S2 normal and normal heart sounds.   No murmur heard. Pulmonary/Chest: Breath sounds normal. She has no wheezes. She has no rhonchi. She has no rales.  Abdominal: Soft. There is no tenderness.  Musculoskeletal: She exhibits no edema.  Neurological: She is alert and oriented to person, place, and time.  Skin: Skin is warm and dry.  Psychiatric: She has a normal mood and affect.    ASSESSMENT:    1. Essential hypertension   2. Hyperlipidemia, unspecified hyperlipidemia type    PLAN:    In order of problems listed above:  1. HTN - BP is well controlled.  She is not having any chest pain or significant shortness of breath.  ECG is unchanged.  She does not need any stress testing at this time.    2. HL - Continue statin.     Medication Adjustments/Labs and Tests Ordered: Current medicines are reviewed at length with the patient today.  Concerns regarding medicines are outlined above.  Medication changes, Labs and Tests ordered today are outlined in the  Patient Instructions noted below. Patient Instructions  Medication Instructions:  No changes.   Labwork: None   Testing/Procedures: None   Follow-Up: Dr. Liam Rogers in 1 year.  Any Other Special Instructions Will Be Listed Below (If Applicable).  If you need a refill on your cardiac medications before your next appointment, please call your pharmacy.    Return in about 1 year (around 12/20/2017) for Routine Follow Up, w/ Dr. Acie Fredrickson.   Signed, Richardson Dopp, PA-C  12/20/2016 5:34 PM    [facility] [address], [place], [place]  [address] Phone: [phone]; Fax: [phone]

## 2016-12-20 NOTE — Patient Instructions (Signed)
Medication Instructions:  No changes.   Labwork: None   Testing/Procedures: None   Follow-Up: Dr. Phil Nahser in 1 year.   Any Other Special Instructions Will Be Listed Below (If Applicable).  If you need a refill on your cardiac medications before your next appointment, please call your pharmacy.  

## 2016-12-22 ENCOUNTER — Encounter: Payer: Self-pay | Admitting: Medical

## 2016-12-24 DIAGNOSIS — E119 Type 2 diabetes mellitus without complications: Secondary | ICD-10-CM | POA: Diagnosis not present

## 2016-12-24 DIAGNOSIS — H524 Presbyopia: Secondary | ICD-10-CM | POA: Diagnosis not present

## 2016-12-24 DIAGNOSIS — Z961 Presence of intraocular lens: Secondary | ICD-10-CM | POA: Diagnosis not present

## 2016-12-24 LAB — HM DIABETES EYE EXAM

## 2016-12-27 ENCOUNTER — Encounter: Payer: Self-pay | Admitting: Medical

## 2016-12-27 ENCOUNTER — Telehealth: Payer: Self-pay

## 2016-12-27 ENCOUNTER — Other Ambulatory Visit: Payer: Self-pay

## 2016-12-27 MED ORDER — LISINOPRIL 5 MG PO TABS: 5.0000 mg | ORAL_TABLET | Freq: Every day | ORAL | 3 refills | Status: DC

## 2016-12-27 NOTE — Telephone Encounter (Signed)
Think she is your pt

## 2016-12-27 NOTE — Telephone Encounter (Signed)
Needs new rx for lisinopril called to [facility] on [address]. This is a new pharmacy for her.

## 2017-01-31 ENCOUNTER — Telehealth: Payer: Self-pay | Admitting: Medical

## 2017-01-31 NOTE — Telephone Encounter (Signed)
Pt called & states [employer] hadn't recv'd a provider form faxed last month, this was refaxed to [employer] fax# [phone].  Called t# [phone] s/w Caren Griffins and she states that it takes a couple days for it to show up on pt's file but that is the last document that is needed.

## 2017-01-31 NOTE — Telephone Encounter (Signed)
Pt no longer wants to continue with Prolia, she wants you to change her to a pill.  She understand you are on vacation and is ok with waiting.

## 2017-02-08 NOTE — Telephone Encounter (Signed)
Please have her remind [place] what medications she has used prior and any problems she had with them - such as Fosamax, Boniva, or other?

## 2017-02-12 DIAGNOSIS — R69 Illness, unspecified: Secondary | ICD-10-CM | POA: Diagnosis not present

## 2017-02-15 NOTE — Telephone Encounter (Signed)
Spoke with pt and she states she has only ever been on Alendronate & and she had no problems with it.

## 2017-02-16 ENCOUNTER — Ambulatory Visit (INDEPENDENT_AMBULATORY_CARE_PROVIDER_SITE_OTHER): Payer: Medicare HMO | Admitting: Medical

## 2017-02-16 ENCOUNTER — Encounter: Payer: Self-pay | Admitting: Medical

## 2017-02-16 VITALS — BP 114/74 | HR 68 | Ht 61.0 in | Wt 173.6 lb

## 2017-02-16 DIAGNOSIS — Z860100 Personal history of colon polyps, unspecified: Secondary | ICD-10-CM

## 2017-02-16 DIAGNOSIS — R69 Illness, unspecified: Secondary | ICD-10-CM | POA: Diagnosis not present

## 2017-02-16 DIAGNOSIS — Z87891 Personal history of nicotine dependence: Secondary | ICD-10-CM

## 2017-02-16 DIAGNOSIS — R195 Other fecal abnormalities: Secondary | ICD-10-CM | POA: Diagnosis not present

## 2017-02-16 DIAGNOSIS — E118 Type 2 diabetes mellitus with unspecified complications: Secondary | ICD-10-CM | POA: Diagnosis not present

## 2017-02-16 DIAGNOSIS — N3281 Overactive bladder: Secondary | ICD-10-CM | POA: Diagnosis not present

## 2017-02-16 DIAGNOSIS — N952 Postmenopausal atrophic vaginitis: Secondary | ICD-10-CM

## 2017-02-16 DIAGNOSIS — M810 Age-related osteoporosis without current pathological fracture: Secondary | ICD-10-CM

## 2017-02-16 DIAGNOSIS — E785 Hyperlipidemia, unspecified: Secondary | ICD-10-CM

## 2017-02-16 DIAGNOSIS — Z7185 Encounter for immunization safety counseling: Secondary | ICD-10-CM

## 2017-02-16 DIAGNOSIS — E559 Vitamin D deficiency, unspecified: Secondary | ICD-10-CM

## 2017-02-16 DIAGNOSIS — F411 Generalized anxiety disorder: Secondary | ICD-10-CM

## 2017-02-16 DIAGNOSIS — Z7189 Other specified counseling: Secondary | ICD-10-CM | POA: Diagnosis not present

## 2017-02-16 DIAGNOSIS — R32 Unspecified urinary incontinence: Secondary | ICD-10-CM | POA: Diagnosis not present

## 2017-02-16 DIAGNOSIS — Z0000 Encounter for general adult medical examination without abnormal findings: Secondary | ICD-10-CM

## 2017-02-16 LAB — LIPID PANEL
CHOL/HDL RATIO: 4.4 ratio (ref <5.0)
CHOLESTEROL: 202 mg/dL — AB (ref <200)
HDL: 46 mg/dL — ABNORMAL LOW (ref >50)
LDL CALC: 115 mg/dL — AB (ref <100)
Triglycerides: 207 mg/dL — ABNORMAL HIGH (ref <150)
VLDL: 41 mg/dL — AB (ref <30)

## 2017-02-16 MED ORDER — ALENDRONATE SODIUM 70 MG PO TABS: 70.0000 mg | ORAL_TABLET | ORAL | 3 refills | Status: DC

## 2017-02-16 MED ORDER — METFORMIN HCL ER 500 MG PO TB24: 500.0000 mg | ORAL_TABLET | Freq: Every day | ORAL | 3 refills | Status: DC

## 2017-02-16 MED ORDER — PRAVASTATIN SODIUM 40 MG PO TABS: 40.0000 mg | ORAL_TABLET | Freq: Every day | ORAL | 3 refills | Status: DC

## 2017-02-16 MED ORDER — LISINOPRIL 5 MG PO TABS: 5.0000 mg | ORAL_TABLET | Freq: Every day | ORAL | 3 refills | Status: DC

## 2017-02-16 MED ORDER — INSULIN DETEMIR 100 UNIT/ML FLEXPEN: 10.0000 [IU] | PEN_INJECTOR | Freq: Every day | SUBCUTANEOUS | 11 refills | Status: DC

## 2017-02-16 MED ORDER — VITAMIN D (ERGOCALCIFEROL) 1.25 MG (50000 UNIT) PO CAPS: 50000.0000 [IU] | ORAL_CAPSULE | ORAL | 3 refills | Status: DC

## 2017-02-16 NOTE — Progress Notes (Signed)
Subjective:    Marissa Oliver is a 76 y.o. female who presents for Preventative Services visit and chronic medical problems/med check visit.    Primary Care Provider Crisoforo Oxford, PA-C here for primary care  Current Health Care Team:  Eye doctor, Dr. Lady Gary [facility]  Glade Lloyd, Othell Jaime Plains, PA-C  GYN, Dr Toney Rakes  Nephrology, Dr. Consuella Lose  Dermatology   Dr. Acie Fredrickson, cardiology  Medical Services you may have received from other than [facility] providers in the past year (date may be approximate) none  Exercise Current exercise habits: some walking   Nutrition/Diet Current diet: in general, a "healthy" diet    Depression Screen Depression screen [facility] 2/9 02/16/2017  Decreased Interest 1  Down, Depressed, Hopeless 1  PHQ - 2 Score 2    Activities of Daily Living Screen/Functional Status Survey Is the patient deaf or have difficulty hearing?: No Does the patient have difficulty seeing, even when wearing glasses/contacts?: No Does the patient have difficulty concentrating, remembering, or making decisions?: No Does the patient have difficulty walking or climbing stairs?: No Does the patient have difficulty dressing or bathing?: No Does the patient have difficulty doing errands alone such as visiting a doctor's office or shopping?: No  Can patient draw a clock face showing 3:15 oclock, yes  Fall Risk Screen Fall Risk  02/16/2017 04/29/2016 07/15/2014  Falls in the past year? No No No    Gait Assessment: Normal gait observed yes  Advanced directives Does patient have a [place]? Yes Does patient have a Living Will? Yes  Past Medical History:  Diagnosis Date  . Atrophic vaginitis    estrace cream rx'd by Dr. Karsten Ro  . Colon polyp    tubular adenoma (colonoscopy q5 yrs)  . Diabetes mellitus 2005  . Diverticulosis   . Dyslipidemia   . GAD (generalized anxiety disorder)   . Hemorrhoids   . OAB (overactive bladder)   .  Osteoporosis   . Psoriasis    mild, sees [facility]  . Smoker     Past Surgical History:  Procedure Laterality Date  . ABDOMINAL HYSTERECTOMY  1972   partial  . APPENDECTOMY    . CARDIOVASCULAR STRESS TEST  12/25/2004   EF 65%. OVERALL NORMAL STRESS CARDIOLITE. NO EVIDENCE  OF ISCHEMIA  . COLONOSCOPY  08/2010   cologuard 01/2016 normal  . PUBOVAGINAL SLING  04/1998  . TUBAL LIGATION      Social History   Social History  . Marital status: Divorced    Spouse name: N/A  . Number of children: N/A  . Years of education: N/A   Occupational History  . Not on file.   Social History Main Topics  . Smoking status: Former Smoker    Packs/day: 0.50    Quit date: 05/22/2012  . Smokeless tobacco: Never Used  . Alcohol use No  . Drug use: No  . Sexual activity: Not Currently   Other Topics Concern  . 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  Has 3 children.  Exercises some with walking.  As of 04/2016.    Family History  Problem Relation Age of Onset  . Arthritis Mother   . Heart failure Mother   . Mental illness Father   . Kidney disease Father   . Stroke Father   . Heart attack Father   . Heart disease Brother   . Cancer Brother     prostate  .  Stroke Maternal Aunt   . Cancer Maternal Uncle   . Stroke Paternal Grandmother      Current Outpatient Prescriptions:  .  conjugated estrogens (PREMARIN) vaginal cream, Insert 0.5 grams vaginally at bedtime nightly x 1 week then twice weekly, Disp: 42.5 g, Rfl: 11 .  Insulin Detemir (LEVEMIR FLEXPEN) 100 UNIT/ML Pen, Inject 10 Units into the skin daily at 10 pm., Disp: 15 mL, Rfl: 11 .  Insulin Pen Needle (BD PEN NEEDLE NANO U/F) 32G X 4 MM MISC, 1 each by Does not apply route daily., Disp: 100 each, Rfl: 11 .  lisinopril (PRINIVIL,ZESTRIL) 5 MG tablet, Take 1 tablet (5 mg total) by mouth daily., Disp: 90 tablet, Rfl: 3 .  ONE TOUCH ULTRA TEST test strip, USE TO  CHECK BLOOD SUGAR ONCE DAILY, Disp: 100 each, Rfl: 3 .  pravastatin (PRAVACHOL) 40 MG tablet, Take 1 tablet (40 mg total) by mouth daily., Disp: 90 tablet, Rfl: 3 .  Vitamin D, Ergocalciferol, (DRISDOL) 50000 units CAPS capsule, Take 1 capsule (50,000 Units total) by mouth once a week., Disp: 12 capsule, Rfl: 3 .  alendronate (FOSAMAX) 70 MG tablet, Take 1 tablet (70 mg total) by mouth every 7 (seven) days. Take with a full glass of water on an empty stomach., Disp: 12 tablet, Rfl: 3 .  metFORMIN (GLUCOPHAGE XR) 500 MG 24 hr tablet, Take 1 tablet (500 mg total) by mouth daily with breakfast., Disp: 90 tablet, Rfl: 3 .  traMADol (ULTRAM) 50 MG tablet, Take 1 tablet (50 mg total) by mouth every 6 (six) hours as needed. (Patient not taking: Reported on 02/16/2017), Disp: 20 tablet, Rfl: 0  No Known Allergies  History reviewed: allergies, current medications, past family history, past medical history, past social history, past surgical history and problem list  Chronic issues discussed: osteoporosis - aggress to go back on Fosamax.   Declines reclast and prolia.   Only had 1 prolia >33mo ago. cologuard negative 2017 through Dr. Collene Mares, GI. Insurance doesn't pay for shingles vaccine  Acute issues discussed: none  Objective:     Biometrics BP 114/74   Pulse 68   Ht 5\' 1"  (1.549 m)   Wt 173 lb 9.6 oz (78.7 kg)   SpO2 96%   BMI 32.80 kg/m   Cognitive Testing  Alert? Yes  Normal Appearance?Yes  Oriented to person? Yes  Place? Yes   Time? Yes  Recall of three objects?  Yes  Can perform simple calculations? Yes  Displays appropriate judgment?Yes  Can read the correct time from a watch face?Yes  General appearance: alert, no distress, WD/WN, white female  Nutritional Status: Inadequate calore intake? no Loss of muscle mass? no Loss of fat beneath skin? no Localized or general edema? no Diminished functional status? no  Other pertinent exam: HEENT: normocephalic, sclerae anicteric,  TMs pearly, nares patent, no discharge or erythema, pharynx normal Oral cavity: MMM, no lesions Neck: supple, no lymphadenopathy, no thyromegaly, no masses Heart: RRR, normal S1, S2, no murmurs Lungs: CTA bilaterally, no wheezes, rhonchi, or rales Abdomen: +bs, soft, non tender, non distended, no masses, no hepatomegaly, no splenomegaly Musculoskeletal: nontender, no swelling, no obvious deformity Extremities: no edema, no cyanosis, no clubbing Pulses: 1+ symmetric, upper and lower extremities, normal cap refill Neurological: alert, oriented x 3, CN2-12 intact, strength normal upper extremities and lower extremities, sensation normal throughout, DTRs 2+ throughout, no cerebellar signs, gait normal Psychiatric: normal affect, behavior normal, pleasant  Breast/GU/ rectal - declined, deferred to gyn Skin: scattered  macules, solar keratosis right forearm, few cherry hemangiomas on torso anterior and posterior, no worrisome lesions  Assessment:   Encounter Diagnoses  Name Primary?  . Medicare annual wellness visit, subsequent Yes  . Encounter for health maintenance examination in adult   . Type 2 diabetes mellitus with complication, without long-term current use of insulin ([place])   . Dyslipidemia   . Osteoporosis, unspecified osteoporosis type, unspecified pathological fracture presence   . Atrophic vaginitis   . Overactive bladder   . Former smoker   . Generalized anxiety disorder   . History of colonic polyps   . Loose stools   . Urinary incontinence, unspecified type   . Vaccine counseling   . Vitamin D deficiency      Plan:   A preventative services visit was completed today.  During the course of the visit today, we discussed and counseled about appropriate screening and preventive services.  A health risk assessment was established today that included a review of current medications, allergies, social history, family history, medical and preventative health history, biometrics,  and preventative screenings to identify potential safety concerns or impairments.  A personalized plan was printed today for your records and use.   Personalized health advice and education was given today to reduce health risks and promote self management and wellness.  Information regarding end of life planning was discussed today.  Conditions/risks identified: No new conditions  Chronic problems discussed today: C/t current medications, regarding fosamax, stop Prolia and change back to fosamax, patient preference.  She is aware of proper use of medication.  Acute problems discussed today: none  Recommendations:  I recommend a yearly ophthalmology/optometry visit for glaucoma screening and eye checkup  I recommended a yearly dental visit for hygiene and checkup  Advanced directives - discussed nature and purpose of Advanced Directives, encouraged them to complete them if they have not done so and/or encouraged them to get [place] a copy if they have done this already.  I recommend a screening mammogram every 1-2 years  Your last colonoscopy was - cologuard 2017  Last bone density screen reviewed.    Referrals today: none  Immunizations: I recommended a yearly influenza vaccine, typically in September when the vaccine is usually available Is the Pneumococcal vaccine up to date: yes. Is the Shingles vaccine up to date: no.  Declines due to cost, lack of insurance coverage Is the Td/Tdap vaccine up to date: yes.  Daphna was seen today for medicare well visit.  Diagnoses and all orders for this visit:  Medicare annual wellness visit, subsequent  Encounter for health maintenance examination in adult  Type 2 diabetes mellitus with complication, without long-term current use of insulin (HCC) -     Hemoglobin A1c -     HM DIABETES FOOT EXAM -     HM DIABETES EYE EXAM -     Insulin Detemir (LEVEMIR FLEXPEN) 100 UNIT/ML Pen; Inject 10 Units into the skin daily at 10 pm. -      lisinopril (PRINIVIL,ZESTRIL) 5 MG tablet; Take 1 tablet (5 mg total) by mouth daily. -     metFORMIN (GLUCOPHAGE XR) 500 MG 24 hr tablet; Take 1 tablet (500 mg total) by mouth daily with breakfast.  Dyslipidemia -     Lipid panel -     pravastatin (PRAVACHOL) 40 MG tablet; Take 1 tablet (40 mg total) by mouth daily.  Osteoporosis, unspecified osteoporosis type, unspecified pathological fracture presence -     alendronate (FOSAMAX) 70 MG tablet;  Take 1 tablet (70 mg total) by mouth every 7 (seven) days. Take with a full glass of water on an empty stomach. -     Vitamin D, Ergocalciferol, (DRISDOL) 50000 units CAPS capsule; Take 1 capsule (50,000 Units total) by mouth once a week.  Atrophic vaginitis  Overactive bladder  Former smoker  Generalized anxiety disorder  History of colonic polyps  Loose stools  Urinary incontinence, unspecified type  Vaccine counseling  Vitamin D deficiency -     Vitamin D, Ergocalciferol, (DRISDOL) 50000 units CAPS capsule; Take 1 capsule (50,000 Units total) by mouth once a week.     Medicare Attestation A preventative services visit was completed today.  During the course of the visit the patient was educated and counseled about appropriate screening and preventive services.  A health risk assessment was established with the patient that included a review of current medications, allergies, social history, family history, medical and preventative health history, biometrics, and preventative screenings to identify potential safety concerns or impairments.  A personalized plan was printed today for the patient's records and use.   Personalized health advice and education was given today to reduce health risks and promote self management and wellness.  Information regarding end of life planning was discussed today.  Crisoforo Oxford, PA-C   02/16/2017

## 2017-02-17 ENCOUNTER — Other Ambulatory Visit: Payer: Self-pay | Admitting: Medical

## 2017-02-17 DIAGNOSIS — E118 Type 2 diabetes mellitus with unspecified complications: Secondary | ICD-10-CM

## 2017-02-17 LAB — HEMOGLOBIN A1C
Hgb A1c MFr Bld: 8.3 % — ABNORMAL HIGH (ref <5.7)
MEAN PLASMA GLUCOSE: 192 mg/dL

## 2017-02-17 MED ORDER — INSULIN DETEMIR 100 UNIT/ML FLEXPEN: 15.0000 [IU] | PEN_INJECTOR | Freq: Every day | SUBCUTANEOUS | 11 refills | Status: DC

## 2017-02-17 MED ORDER — ROSUVASTATIN CALCIUM 20 MG PO TABS: 20.0000 mg | ORAL_TABLET | Freq: Every day | ORAL | 0 refills | Status: DC

## 2017-02-22 ENCOUNTER — Telehealth: Payer: Self-pay | Admitting: Medical

## 2017-02-22 NOTE — Telephone Encounter (Signed)
Pt came in for Medicare well visit. Please complete optum form and return to kat.

## 2017-02-23 ENCOUNTER — Other Ambulatory Visit: Payer: Self-pay | Admitting: Medical

## 2017-02-23 ENCOUNTER — Telehealth: Payer: Self-pay | Admitting: Medical

## 2017-02-23 NOTE — Telephone Encounter (Signed)
I don't think she has taken this in about a year.  Please inquire?

## 2017-02-23 NOTE — Telephone Encounter (Signed)
Recvd refill request for Paroxetine #90

## 2017-02-23 NOTE — Telephone Encounter (Signed)
Pt wants to go back on med I told her she may need an appointment to go back on this

## 2017-02-23 NOTE — Telephone Encounter (Signed)
Pt called because she got another call from [employer] stating they needed copies of her credit card receipts from where she paid.  I called [employer] and spoke with Colletta [place] t# [phone] & advised pt paid cash.  Printed out copies & emailed to her supervisor [email or web address].  She will have her supervisor call me back today

## 2017-02-25 ENCOUNTER — Telehealth: Payer: Self-pay | Admitting: Medical

## 2017-02-25 ENCOUNTER — Other Ambulatory Visit: Payer: Self-pay | Admitting: Medical

## 2017-02-25 MED ORDER — PAROXETINE HCL 20 MG PO TABS: ORAL_TABLET | ORAL | 0 refills | Status: DC

## 2017-02-25 NOTE — Telephone Encounter (Signed)
Have her restart the medication but make f/u appt to discuss medication and concerns

## 2017-02-25 NOTE — Telephone Encounter (Signed)
Please call patient about study for overactive bladder sympotms  We work with a company that does research studies and medication trials, called [employer].   They are currently doing a study on a medication for overactive bladder     Participants in the study receive compensation, free medication, close follow up and monitoring.   I wanted to let them know about this study because of their prior bladder symptoms.  See if they have any interest in being part of a study.  If agreeable, pass on his info to [employer] and let me know.  If they desire not to be part of the study, that is fine.

## 2017-02-25 NOTE — Telephone Encounter (Signed)
Faxed order to [employer]

## 2017-02-25 NOTE — Telephone Encounter (Signed)
Pt said that she will hold off on this meds for now.

## 2017-02-25 NOTE — Telephone Encounter (Signed)
Pt is willing to  Go to the study

## 2017-03-16 ENCOUNTER — Encounter: Payer: Self-pay | Admitting: Gynecology

## 2017-04-07 NOTE — Telephone Encounter (Signed)
Called [facility] [employer] again s/w Seth Bake and she will get in such with supervisor Greta Doom and have her call me back

## 2017-04-08 NOTE — Telephone Encounter (Signed)
Called [facility] [employer] dept back s/w Malachi Carl and states is in final stages now in finance dept. Approved for $174.98, check should be cut in 5-7 business days ref# [id number]

## 2017-04-19 NOTE — Telephone Encounter (Signed)
[facility] [employer] check finally recv'd $174, pt informed

## 2017-04-19 NOTE — Telephone Encounter (Signed)
Finally recv'd check from Patient Marissa Oliver for [phone], called pt & informed

## 2017-04-25 ENCOUNTER — Other Ambulatory Visit: Payer: Self-pay | Admitting: Medical

## 2017-04-25 ENCOUNTER — Telehealth: Payer: Self-pay

## 2017-04-25 DIAGNOSIS — E118 Type 2 diabetes mellitus with unspecified complications: Secondary | ICD-10-CM

## 2017-04-25 MED ORDER — METFORMIN HCL ER 500 MG PO TB24: 500.0000 mg | ORAL_TABLET | Freq: Every day | ORAL | 3 refills | Status: DC

## 2017-04-25 NOTE — Telephone Encounter (Signed)
Fax rcvd from pharmacy and she reports that she is not sure if she should be taking Metformin 500mg  or Metformin 750mg . Whichever is needed please send in new rx. Victorino December

## 2017-04-25 NOTE — Telephone Encounter (Signed)
Spoke with pt- she reports that she has been taking the 500mg . She will continue this. Marissa Oliver

## 2017-04-25 NOTE — Telephone Encounter (Signed)
She should be taking the Metformin 500 xr due to loose stools at the higher dose

## 2017-04-26 ENCOUNTER — Telehealth: Payer: Self-pay

## 2017-04-26 DIAGNOSIS — E118 Type 2 diabetes mellitus with unspecified complications: Secondary | ICD-10-CM

## 2017-04-26 MED ORDER — METFORMIN HCL ER 500 MG PO TB24: 500.0000 mg | ORAL_TABLET | Freq: Every day | ORAL | 3 refills | Status: DC

## 2017-04-26 NOTE — Telephone Encounter (Signed)
Pt request correction of metformin to be sent to [facility], not the mail order. This has been corrected per pt request. Victorino December

## 2017-05-11 ENCOUNTER — Other Ambulatory Visit: Payer: Self-pay | Admitting: Family Medicine

## 2017-05-12 DIAGNOSIS — R69 Illness, unspecified: Secondary | ICD-10-CM | POA: Diagnosis not present

## 2017-05-18 ENCOUNTER — Other Ambulatory Visit: Payer: Self-pay | Admitting: Medical

## 2017-05-18 ENCOUNTER — Encounter: Payer: Self-pay | Admitting: Medical

## 2017-05-18 ENCOUNTER — Ambulatory Visit (INDEPENDENT_AMBULATORY_CARE_PROVIDER_SITE_OTHER): Payer: Medicare HMO | Admitting: Medical

## 2017-05-18 VITALS — BP 114/74 | HR 64 | Wt 171.2 lb

## 2017-05-18 DIAGNOSIS — E559 Vitamin D deficiency, unspecified: Secondary | ICD-10-CM

## 2017-05-18 DIAGNOSIS — E785 Hyperlipidemia, unspecified: Secondary | ICD-10-CM | POA: Diagnosis not present

## 2017-05-18 DIAGNOSIS — Z794 Long term (current) use of insulin: Secondary | ICD-10-CM | POA: Diagnosis not present

## 2017-05-18 DIAGNOSIS — M810 Age-related osteoporosis without current pathological fracture: Secondary | ICD-10-CM

## 2017-05-18 DIAGNOSIS — Z87891 Personal history of nicotine dependence: Secondary | ICD-10-CM | POA: Diagnosis not present

## 2017-05-18 DIAGNOSIS — E1169 Type 2 diabetes mellitus with other specified complication: Secondary | ICD-10-CM

## 2017-05-18 LAB — CBC
HCT: 38.8 % (ref 35.0–45.0)
HEMOGLOBIN: 13.3 g/dL (ref 11.7–15.5)
MCH: 29.7 pg (ref 27.0–33.0)
MCHC: 34.3 g/dL (ref 32.0–36.0)
MCV: 86.6 fL (ref 80.0–100.0)
MPV: 10.7 fL (ref 7.5–12.5)
Platelets: 183 10*3/uL (ref 140–400)
RBC: 4.48 MIL/uL (ref 3.80–5.10)
RDW: 14.1 % (ref 11.0–15.0)
WBC: 4.8 10*3/uL (ref 4.0–10.5)

## 2017-05-18 LAB — COMPREHENSIVE METABOLIC PANEL
ALT: 18 U/L (ref 6–29)
AST: 14 U/L (ref 10–35)
Albumin: 4.4 g/dL (ref 3.6–5.1)
Alkaline Phosphatase: 72 U/L (ref 33–130)
BUN: 17 mg/dL (ref 7–25)
CALCIUM: 10 mg/dL (ref 8.6–10.4)
CO2: 19 mmol/L — AB (ref 20–31)
Chloride: 104 mmol/L (ref 98–110)
Creat: 0.7 mg/dL (ref 0.60–0.93)
GLUCOSE: 178 mg/dL — AB (ref 65–99)
POTASSIUM: 4.2 mmol/L (ref 3.5–5.3)
Sodium: 136 mmol/L (ref 135–146)
Total Bilirubin: 0.3 mg/dL (ref 0.2–1.2)
Total Protein: 6.7 g/dL (ref 6.1–8.1)

## 2017-05-18 LAB — LIPID PANEL
CHOL/HDL RATIO: 3.9 ratio (ref <5.0)
CHOLESTEROL: 185 mg/dL (ref <200)
HDL: 47 mg/dL — ABNORMAL LOW (ref >50)
LDL Cholesterol: 109 mg/dL — ABNORMAL HIGH (ref <100)
Triglycerides: 143 mg/dL (ref <150)
VLDL: 29 mg/dL (ref <30)

## 2017-05-18 NOTE — Progress Notes (Signed)
Subjective: Chief Complaint  Patient presents with  . discuss her kidney levels, a1c    discuss her kiddney level, and a1c    Here for f/u on chronic disease and medications.   She is worried about her kidneys.  Wants labs today.  Wants to avoid high cost medications.     Diabetes - taking Levemir 12 u QHS.  Taking Metformin 500mg  XR once daily.  Checking sugars, gets between 170-200s.  No foot concerns.  Checking feet daily.   Diet - is ok, has tried to cut out cookies and potato chips.  Exercise - not much, some walking  Hyperlipidemia - taking Crestor 20mg  daily, not taking aspirin.  Taking Lisinopril 5mg  daily for kidney protection  Takes Paxil sometimes, not daily, takes it when she feels like she needs it  Osteoporosis - taking fosamax weekly, taking weekly vitamin D 50000 u  Past Medical History:  Diagnosis Date  . Atrophic vaginitis    estrace cream rx'd by Dr. Karsten Ro  . Colon polyp    tubular adenoma (colonoscopy q5 yrs)  . Diabetes mellitus 2005  . Diverticulosis   . Dyslipidemia   . GAD (generalized anxiety disorder)   . Hemorrhoids   . OAB (overactive bladder)   . Osteoporosis   . Psoriasis    mild, sees [facility]  . Smoker    Current Outpatient Prescriptions on File Prior to Visit  Medication Sig Dispense Refill  . alendronate (FOSAMAX) 70 MG tablet Take 1 tablet (70 mg total) by mouth every 7 (seven) days. Take with a full glass of water on an empty stomach. 12 tablet 3  . conjugated estrogens (PREMARIN) vaginal cream Insert 0.5 grams vaginally at bedtime nightly x 1 week then twice weekly 42.5 g 11  . Insulin Detemir (LEVEMIR FLEXPEN) 100 UNIT/ML Pen Inject 15 Units into the skin daily at 10 pm. 15 mL 11  . Insulin Pen Needle (BD PEN NEEDLE NANO U/F) 32G X 4 MM MISC 1 each by Does not apply route daily. 100 each 11  . lisinopril (PRINIVIL,ZESTRIL) 5 MG tablet Take 1 tablet (5 mg total) by mouth daily. 90 tablet 3  . metFORMIN (GLUCOPHAGE XR) 500 MG  24 hr tablet Take 1 tablet (500 mg total) by mouth daily with breakfast. 90 tablet 3  . ONE TOUCH ULTRA TEST test strip CHECK ONCE DAILY 100 each 0  . PARoxetine (PAXIL) 20 MG tablet TAKE 1/2 TABLET BY MOUTH EVERY DAY FOR THE FIRST WEEK THEN TAKE 1 TABLET EVERY DAY THEREAFTER 90 tablet 0  . rosuvastatin (CRESTOR) 20 MG tablet Take 1 tablet (20 mg total) by mouth daily. 90 tablet 0  . traMADol (ULTRAM) 50 MG tablet Take 1 tablet (50 mg total) by mouth every 6 (six) hours as needed. 20 tablet 0  . Vitamin D, Ergocalciferol, (DRISDOL) 50000 units CAPS capsule Take 1 capsule (50,000 Units total) by mouth once a week. 12 capsule 3   No current facility-administered medications on file prior to visit.     Review of Systems Constitutional: -fever, -chills, -sweats, -unexpected weight change,-fatigue ENT: -runny nose, -ear pain, -sore throat Cardiology:  -chest pain, -palpitations, -edema Respiratory: -cough, -shortness of breath, -wheezing Gastroenterology: -abdominal pain, -nausea, -vomiting, -diarrhea, -constipation  Hematology: -bleeding or bruising problems Musculoskeletal: -arthralgias, -myalgias, -joint swelling, -back pain Ophthalmology: -vision changes Urology: -dysuria, -difficulty urinating, -hematuria, -urinary frequency, -urgency Neurology: -headache, -weakness, -tingling, -numbness      Objective: BP 114/74   Pulse 64   Wt 171  lb 3.2 oz (77.7 kg)   SpO2 97%   BMI 32.35 kg/m   Wt Readings from Last 3 Encounters:  05/18/17 171 lb 3.2 oz (77.7 kg)  02/16/17 173 lb 9.6 oz (78.7 kg)  12/20/16 174 lb 6.4 oz (79.1 kg)   BP Readings from Last 3 Encounters:  05/18/17 114/74  02/16/17 114/74  12/20/16 (!) 130/52   General appearance: alert, no distress, WD/WN,  Oral cavity: MMM, no lesions Neck: supple, no lymphadenopathy, no thyromegaly, no masses Heart: RRR, normal S1, S2, no murmurs Lungs: CTA bilaterally, no wheezes, rhonchi, or rales Abdomen: +bs, soft, non tender,  non distended, no masses, no hepatomegaly, no splenomegaly Pulses: 2+ symmetric, upper and lower extremities, normal cap refill Ext: no edema   Diabetic Foot Exam - Simple   Simple Foot Form Diabetic Foot exam was performed with the following findings:  Yes 05/18/2017  9:33 AM  Visual Inspection No deformities, no ulcerations, no other skin breakdown bilaterally:  Yes Sensation Testing Intact to touch and monofilament testing bilaterally:  Yes Pulse Check Posterior Tibialis and Dorsalis pulse intact bilaterally:  Yes Comments       Assessment: Encounter Diagnoses  Name Primary?  . Dyslipidemia Yes  . Type 2 diabetes mellitus with other specified complication, with long-term current use of insulin ([place])   . Osteoporosis, unspecified osteoporosis type, unspecified pathological fracture presence   . Vitamin D deficiency   . Former smoker      Plan: C/t routine medications, counseled on diabetes self care, daily foot checks, glucose testing.  Advised increased exercise, healthy diet.   Will likely need to make medication adjustments for diabetes given her recent readings.   Glad she continues to abstain from tobacco abuse.   Marissa Oliver was seen today for discuss her kidney levels, a1c.  Diagnoses and all orders for this visit:  Dyslipidemia -     Comprehensive metabolic panel -     CBC -     Lipid panel  Type 2 diabetes mellitus with other specified complication, with long-term current use of insulin (HCC) -     Comprehensive metabolic panel -     CBC -     Hemoglobin A1c -     HM DIABETES FOOT EXAM -     HM DIABETES EYE EXAM  Osteoporosis, unspecified osteoporosis type, unspecified pathological fracture presence -     Comprehensive metabolic panel -     CBC  Vitamin D deficiency -     Comprehensive metabolic panel -     CBC  Former smoker

## 2017-05-19 LAB — HEMOGLOBIN A1C
Hgb A1c MFr Bld: 8.4 % — ABNORMAL HIGH (ref <5.7)
MEAN PLASMA GLUCOSE: 194 mg/dL

## 2017-05-20 ENCOUNTER — Encounter: Payer: Self-pay | Admitting: Medical

## 2017-05-23 ENCOUNTER — Encounter: Payer: Self-pay | Admitting: Medical

## 2017-05-23 ENCOUNTER — Other Ambulatory Visit: Payer: Self-pay | Admitting: Medical

## 2017-05-23 MED ORDER — ROSUVASTATIN CALCIUM 40 MG PO TABS: 40.0000 mg | ORAL_TABLET | Freq: Every day | ORAL | 1 refills | Status: DC

## 2017-05-24 ENCOUNTER — Other Ambulatory Visit: Payer: Self-pay | Admitting: Medical

## 2017-05-26 ENCOUNTER — Encounter: Payer: Self-pay | Admitting: Medical

## 2017-07-25 ENCOUNTER — Other Ambulatory Visit: Payer: Self-pay | Admitting: Medical

## 2017-07-25 DIAGNOSIS — R69 Illness, unspecified: Secondary | ICD-10-CM | POA: Diagnosis not present

## 2017-08-18 ENCOUNTER — Ambulatory Visit (INDEPENDENT_AMBULATORY_CARE_PROVIDER_SITE_OTHER): Payer: Medicare HMO | Admitting: Medical

## 2017-08-18 ENCOUNTER — Encounter: Payer: Self-pay | Admitting: Medical

## 2017-08-18 VITALS — BP 132/74 | HR 71 | Wt 170.4 lb

## 2017-08-18 DIAGNOSIS — Z794 Long term (current) use of insulin: Secondary | ICD-10-CM

## 2017-08-18 DIAGNOSIS — E1169 Type 2 diabetes mellitus with other specified complication: Secondary | ICD-10-CM | POA: Diagnosis not present

## 2017-08-18 DIAGNOSIS — Z87891 Personal history of nicotine dependence: Secondary | ICD-10-CM | POA: Diagnosis not present

## 2017-08-18 DIAGNOSIS — Z23 Encounter for immunization: Secondary | ICD-10-CM | POA: Diagnosis not present

## 2017-08-18 DIAGNOSIS — E785 Hyperlipidemia, unspecified: Secondary | ICD-10-CM

## 2017-08-18 NOTE — Progress Notes (Signed)
Subjective: Chief Complaint  Patient presents with  . Diabetes    dm check, flu shot    Here for f/u on chronic disease and medications.    Been having some rectal pain. Seeing GI next week for this.  Diabetes - taking Levemir 15 u QHS.  Taking Metformin 500mg  XR once daily.  She declined SGL2 medication last visit, worried about possible side effects and cost.  She notes sugars are running normal.   Checking sugars, gets between 170-200s.  No foot concerns.  Checking feet daily.    Last visit we increased to Crestor 40mg , and she is compliant with this.   Past Medical History:  Diagnosis Date  . Atrophic vaginitis    estrace cream rx'd by Dr. Karsten Ro  . Colon polyp    tubular adenoma (colonoscopy q5 yrs)  . Diabetes mellitus 2005  . Diverticulosis   . Dyslipidemia   . GAD (generalized anxiety disorder)   . Hemorrhoids   . OAB (overactive bladder)   . Osteoporosis   . Psoriasis    mild, sees [facility]  . Smoker    Current Outpatient Prescriptions on File Prior to Visit  Medication Sig Dispense Refill  . alendronate (FOSAMAX) 70 MG tablet Take 1 tablet (70 mg total) by mouth every 7 (seven) days. Take with a full glass of water on an empty stomach. 12 tablet 3  . Insulin Detemir (LEVEMIR FLEXPEN) 100 UNIT/ML Pen Inject 15 Units into the skin daily at 10 pm. 15 mL 11  . Insulin Pen Needle (BD PEN NEEDLE NANO U/F) 32G X 4 MM MISC 1 each by Does not apply route daily. 100 each 11  . lisinopril (PRINIVIL,ZESTRIL) 5 MG tablet Take 1 tablet (5 mg total) by mouth daily. 90 tablet 3  . metFORMIN (GLUCOPHAGE XR) 500 MG 24 hr tablet Take 1 tablet (500 mg total) by mouth daily with breakfast. 90 tablet 3  . ONE TOUCH ULTRA TEST test strip CHECK ONCE DAILY 100 each 0  . PARoxetine (PAXIL) 20 MG tablet TAKE 1/2 TABLET BY MOUTH EVERY DAY FOR THE FIRST WEEK THEN TAKE 1 TABLET EVERY DAY THEREAFTER 90 tablet 0  . rosuvastatin (CRESTOR) 40 MG tablet Take 1 tablet (40 mg total) by  mouth daily. 90 tablet 1  . Vitamin D, Ergocalciferol, (DRISDOL) 50000 units CAPS capsule Take 1 capsule (50,000 Units total) by mouth once a week. 12 capsule 3  . traMADol (ULTRAM) 50 MG tablet Take 1 tablet (50 mg total) by mouth every 6 (six) hours as needed. (Patient not taking: Reported on 08/18/2017) 20 tablet 0   No current facility-administered medications on file prior to visit.     Review of Systems Constitutional: -fever, -chills, -sweats, -unexpected weight change,-fatigue ENT: -runny nose, -ear pain, -sore throat Cardiology:  -chest pain, -palpitations, -edema Respiratory: -cough, -shortness of breath, -wheezing Gastroenterology: -abdominal pain, -nausea, -vomiting, -diarrhea, -constipation  Hematology: -bleeding or bruising problems Musculoskeletal: -arthralgias, -myalgias, -joint swelling, -back pain Ophthalmology: -vision changes Urology: -dysuria, -difficulty urinating, -hematuria, -urinary frequency, -urgency Neurology: -headache, -weakness, -tingling, -numbness      Objective: BP 132/74   Pulse 71   Wt 170 lb 6.4 oz (77.3 kg)   SpO2 97%   BMI 32.20 kg/m   Wt Readings from Last 3 Encounters:  08/18/17 170 lb 6.4 oz (77.3 kg)  05/18/17 171 lb 3.2 oz (77.7 kg)  02/16/17 173 lb 9.6 oz (78.7 kg)   BP Readings from Last 3 Encounters:  08/18/17 132/74  05/18/17  114/74  02/16/17 114/74   General appearance: alert, no distress, WD/WN,  Oral cavity: MMM, no lesions Neck: supple, no lymphadenopathy, no thyromegaly, no masses Heart: RRR, normal S1, S2, no murmurs Lungs: CTA bilaterally, no wheezes, rhonchi, or rales Pulses: 2+ symmetric, upper and lower extremities, normal cap refill Ext: no edema    Assessment: Encounter Diagnoses  Name Primary?  . Type 2 diabetes mellitus with other specified complication, with long-term current use of insulin ([place]) Yes  . Dyslipidemia   . Need for influenza vaccination   . Former smoker      Plan: Diabetes -  compliant with Metformin xr 500mg  daily, Levemir 15u.  She declined SLG2 medication last visit.   Counseled on diabetes self care, daily foot checks, glucose testing.  Advised increased exercise, healthy diet.     Dyslipidemia - compliant with crestor 40mg .  Repeat labs today  Former smoker  Counseled on the influenza virus vaccine.  Vaccine information sheet given.   High dose Influenza vaccine given after consent obtained.  Ariellah was seen today for diabetes.  Diagnoses and all orders for this visit:  Type 2 diabetes mellitus with other specified complication, with long-term current use of insulin (HCC) -     Hemoglobin A1c  Dyslipidemia -     Lipid panel -     ALT  Need for influenza vaccination  Former smoker

## 2017-08-19 ENCOUNTER — Other Ambulatory Visit: Payer: Self-pay | Admitting: Medical

## 2017-08-19 ENCOUNTER — Telehealth: Payer: Self-pay

## 2017-08-19 DIAGNOSIS — E118 Type 2 diabetes mellitus with unspecified complications: Secondary | ICD-10-CM

## 2017-08-19 LAB — LIPID PANEL
Cholesterol: 121 mg/dL
HDL: 47 mg/dL — ABNORMAL LOW
LDL Cholesterol (Calc): 52 mg/dL
Non-HDL Cholesterol (Calc): 74 mg/dL
Total CHOL/HDL Ratio: 2.6 (calc)
Triglycerides: 132 mg/dL

## 2017-08-19 LAB — HEMOGLOBIN A1C
Hgb A1c MFr Bld: 9.1 %{Hb} — ABNORMAL HIGH
Mean Plasma Glucose: 214 (calc)
eAG (mmol/L): 11.9 (calc)

## 2017-08-19 LAB — ALT: ALT: 17 U/L (ref 6–29)

## 2017-08-19 MED ORDER — EMPAGLIFLOZIN 10 MG PO TABS: 10.0000 mg | ORAL_TABLET | Freq: Every day | ORAL | 2 refills | Status: DC

## 2017-08-19 MED ORDER — DAPAGLIFLOZIN PROPANEDIOL 5 MG PO TABS: 5.0000 mg | ORAL_TABLET | Freq: Every day | ORAL | 0 refills | Status: DC

## 2017-08-19 MED ORDER — ROSUVASTATIN CALCIUM 40 MG PO TABS: 40.0000 mg | ORAL_TABLET | Freq: Every day | ORAL | 3 refills | Status: DC

## 2017-08-19 MED ORDER — METFORMIN HCL ER 500 MG PO TB24: 500.0000 mg | ORAL_TABLET | Freq: Every day | ORAL | 3 refills | Status: DC

## 2017-08-19 MED ORDER — INSULIN PEN NEEDLE 32G X 4 MM MISC: 1.0000 | Freq: Every day | 11 refills | Status: DC

## 2017-08-19 MED ORDER — INSULIN DETEMIR 100 UNIT/ML FLEXPEN: 20.0000 [IU] | PEN_INJECTOR | Freq: Every day | SUBCUTANEOUS | 11 refills | Status: DC

## 2017-08-19 NOTE — Telephone Encounter (Signed)
Pt called back   After checking  With her insurance company she can not  afford the jardiance.But her insurance will pay for the farxgia 10mg  for a 90 day supply.  Per shane  She need to keep taking the metformin as well. Can you please change this

## 2017-10-17 DIAGNOSIS — R69 Illness, unspecified: Secondary | ICD-10-CM | POA: Diagnosis not present

## 2017-10-20 ENCOUNTER — Other Ambulatory Visit: Payer: Self-pay | Admitting: Medical

## 2017-11-09 ENCOUNTER — Telehealth: Payer: Self-pay | Admitting: Medical

## 2017-11-09 NOTE — Telephone Encounter (Signed)
Pt called and wants to know when she should schedule her next diabetes check, she did start the new medication after last labs.

## 2017-11-09 NOTE — Telephone Encounter (Signed)
Anytime now, this month

## 2017-11-21 ENCOUNTER — Ambulatory Visit: Payer: Medicare HMO | Admitting: Medical

## 2017-11-22 ENCOUNTER — Encounter: Payer: Self-pay | Admitting: Medical

## 2017-11-22 ENCOUNTER — Ambulatory Visit (INDEPENDENT_AMBULATORY_CARE_PROVIDER_SITE_OTHER): Payer: Medicare HMO | Admitting: Medical

## 2017-11-22 VITALS — BP 130/80 | HR 73 | Wt 166.6 lb

## 2017-11-22 DIAGNOSIS — Z86010 Personal history of colonic polyps: Secondary | ICD-10-CM | POA: Diagnosis not present

## 2017-11-22 DIAGNOSIS — M810 Age-related osteoporosis without current pathological fracture: Secondary | ICD-10-CM | POA: Diagnosis not present

## 2017-11-22 DIAGNOSIS — N3281 Overactive bladder: Secondary | ICD-10-CM

## 2017-11-22 DIAGNOSIS — Z794 Long term (current) use of insulin: Secondary | ICD-10-CM

## 2017-11-22 DIAGNOSIS — E1169 Type 2 diabetes mellitus with other specified complication: Secondary | ICD-10-CM

## 2017-11-22 DIAGNOSIS — E559 Vitamin D deficiency, unspecified: Secondary | ICD-10-CM

## 2017-11-22 DIAGNOSIS — E785 Hyperlipidemia, unspecified: Secondary | ICD-10-CM

## 2017-11-22 NOTE — Progress Notes (Signed)
Subjective: Chief Complaint  Patient presents with  . Follow-up    follow up  and discuss meds and  fasting for labs    Here today for fasting med check.  Diabetes-checking sugars, trying to eat healthy, getting some limited exercise.  She is taking metformin XR once daily, Levemir 16 units nightly, and she did start [place] from last visit.  Compliant with Crestor and lisinopril as well.  She has no foot lesions, no polyuria no polydipsia no particular concerns.  Past Medical History:  Diagnosis Date  . Atrophic vaginitis    estrace cream rx'd by Dr. Karsten Ro  . Colon polyp    tubular adenoma (colonoscopy q5 yrs)  . Diabetes mellitus 2005  . Diverticulosis   . Dyslipidemia   . GAD (generalized anxiety disorder)   . Hemorrhoids   . OAB (overactive bladder)   . Osteoporosis   . Psoriasis    mild, sees [facility]  . Smoker    Current Outpatient Medications on File Prior to Visit  Medication Sig Dispense Refill  . alendronate (FOSAMAX) 70 MG tablet Take 1 tablet (70 mg total) by mouth every 7 (seven) days. Take with a full glass of water on an empty stomach. 12 tablet 3  . dapagliflozin propanediol (FARXIGA) 5 MG TABS tablet Take 5 mg by mouth daily. 90 tablet 0  . Insulin Detemir (LEVEMIR FLEXPEN) 100 UNIT/ML Pen Inject 20 Units into the skin daily at 10 pm. 20 mL 11  . Insulin Pen Needle (BD PEN NEEDLE NANO U/F) 32G X 4 MM MISC 1 each by Does not apply route daily. 100 each 11  . lisinopril (PRINIVIL,ZESTRIL) 5 MG tablet Take 1 tablet (5 mg total) by mouth daily. 90 tablet 3  . metFORMIN (GLUCOPHAGE XR) 500 MG 24 hr tablet Take 1 tablet (500 mg total) by mouth daily with breakfast. 90 tablet 3  . ONE TOUCH ULTRA TEST test strip CHECK ONCE DAILY 100 each 0  . rosuvastatin (CRESTOR) 20 MG tablet TAKE 1 TABLET (20 MG TOTAL) BY MOUTH DAILY. 90 tablet 0  . Vitamin D, Ergocalciferol, (DRISDOL) 50000 units CAPS capsule Take 1 capsule (50,000 Units total) by mouth once a week. 12  capsule 3  . traMADol (ULTRAM) 50 MG tablet Take 1 tablet (50 mg total) by mouth every 6 (six) hours as needed. (Patient not taking: Reported on 11/22/2017) 20 tablet 0   No current facility-administered medications on file prior to visit.    ROS as in subjective   Objective: BP 130/80   Pulse 73   Wt 166 lb 9.6 oz (75.6 kg)   SpO2 97%   BMI 31.48 kg/m    Wt Readings from Last 3 Encounters:  11/22/17 166 lb 9.6 oz (75.6 kg)  08/18/17 170 lb 6.4 oz (77.3 kg)  05/18/17 171 lb 3.2 oz (77.7 kg)   General appearance: alert, no distress, WD/WN,  Oral cavity: MMM, no lesions Neck: supple, no lymphadenopathy, no thyromegaly, no masses Heart: RRR, normal S1, S2, no murmurs Lungs: CTA bilaterally, no wheezes, rhonchi, or rales Abdomen: +bs, soft, non tender, non distended, no masses, no hepatomegaly, no splenomegaly Ext: no edema  Diabetic Foot Exam - Simple   Simple Foot Form Diabetic Foot exam was performed with the following findings:  Yes 11/22/2017 12:06 PM  Visual Inspection See comments:  Yes Sensation Testing Intact to touch and monofilament testing bilaterally:  Yes Pulse Check See comments:  Yes Comments 1+ pedal pulses, hammertoe bilateral fifth toes, worse on  the left, somewhat brownish thickened great toenails, otherwise no foot lesions      Assessment: Encounter Diagnoses  Name Primary?  . Type 2 diabetes mellitus with other specified complication, with long-term current use of insulin ([place]) Yes  . Dyslipidemia   . Osteoporosis, unspecified osteoporosis type, unspecified pathological fracture presence   . Vitamin D deficiency   . History of colonic polyps   . Overactive bladder       Plan Diabetes-labs today, continue daily foot checks, current medications, yearly eye doctor visit, glucose monitoring at home, avoiding hypoglycemia.  Dyslipidemia-at goal, continue Crestor  Osteoporosis-continue Fosamax, discussed diet and exercise including weightbearing  exercise  Vitamin D deficiency-continue supplemental vitamin D, get fish in the diet, regular sun exposure  Overactive bladder-continue current medication  Of note pending labs as she will need all of her medications sent to mail order, [employer].  Jodette was seen today for follow-up.  Diagnoses and all orders for this visit:  Type 2 diabetes mellitus with other specified complication, with long-term current use of insulin (HCC) -     Hemoglobin A1c -     HM DIABETES EYE EXAM -     HM DIABETES FOOT EXAM -     Microalbumin / creatinine urine ratio -     Comprehensive metabolic panel  Dyslipidemia -     Comprehensive metabolic panel  Osteoporosis, unspecified osteoporosis type, unspecified pathological fracture presence  Vitamin D deficiency  History of colonic polyps  Overactive bladder

## 2017-11-23 ENCOUNTER — Other Ambulatory Visit: Payer: Self-pay | Admitting: Medical

## 2017-11-23 DIAGNOSIS — M810 Age-related osteoporosis without current pathological fracture: Secondary | ICD-10-CM

## 2017-11-23 DIAGNOSIS — E559 Vitamin D deficiency, unspecified: Secondary | ICD-10-CM

## 2017-11-23 DIAGNOSIS — E118 Type 2 diabetes mellitus with unspecified complications: Secondary | ICD-10-CM

## 2017-11-23 LAB — COMPREHENSIVE METABOLIC PANEL
ALK PHOS: 80 IU/L (ref 39–117)
ALT: 23 IU/L (ref 0–32)
AST: 20 IU/L (ref 0–40)
Albumin/Globulin Ratio: 2.1 (ref 1.2–2.2)
Albumin: 4.9 g/dL — ABNORMAL HIGH (ref 3.5–4.8)
BILIRUBIN TOTAL: 0.3 mg/dL (ref 0.0–1.2)
BUN/Creatinine Ratio: 22 (ref 12–28)
BUN: 16 mg/dL (ref 8–27)
CHLORIDE: 105 mmol/L (ref 96–106)
CO2: 20 mmol/L (ref 20–29)
Calcium: 10.1 mg/dL (ref 8.7–10.3)
Creatinine, Ser: 0.72 mg/dL (ref 0.57–1.00)
GFR calc Af Amer: 93 mL/min/{1.73_m2} (ref >59)
GFR calc non Af Amer: 80 mL/min/{1.73_m2} (ref >59)
GLUCOSE: 139 mg/dL — AB (ref 65–99)
Globulin, Total: 2.3 g/dL (ref 1.5–4.5)
Potassium: 4.3 mmol/L (ref 3.5–5.2)
Sodium: 142 mmol/L (ref 134–144)
Total Protein: 7.2 g/dL (ref 6.0–8.5)

## 2017-11-23 LAB — MICROALBUMIN / CREATININE URINE RATIO
CREATININE, UR: 64.9 mg/dL
MICROALB/CREAT RATIO: 16.2 mg/g{creat} (ref 0.0–30.0)
Microalbumin, Urine: 10.5 ug/mL

## 2017-11-23 LAB — HEMOGLOBIN A1C
ESTIMATED AVERAGE GLUCOSE: 200 mg/dL
Hgb A1c MFr Bld: 8.6 % — ABNORMAL HIGH (ref 4.8–5.6)

## 2017-11-23 MED ORDER — ALENDRONATE SODIUM 70 MG PO TABS: 70.0000 mg | ORAL_TABLET | ORAL | 3 refills | Status: DC

## 2017-11-23 MED ORDER — DAPAGLIFLOZIN PROPANEDIOL 5 MG PO TABS: 5.0000 mg | ORAL_TABLET | Freq: Every day | ORAL | 3 refills | Status: DC

## 2017-11-23 MED ORDER — ROSUVASTATIN CALCIUM 20 MG PO TABS: 20.0000 mg | ORAL_TABLET | Freq: Every day | ORAL | 3 refills | Status: DC

## 2017-11-23 MED ORDER — VITAMIN D (ERGOCALCIFEROL) 1.25 MG (50000 UNIT) PO CAPS: 50000.0000 [IU] | ORAL_CAPSULE | ORAL | 3 refills | Status: DC

## 2017-11-23 MED ORDER — LISINOPRIL 5 MG PO TABS: 5.0000 mg | ORAL_TABLET | Freq: Every day | ORAL | 3 refills | Status: DC

## 2017-11-23 MED ORDER — INSULIN DETEMIR 100 UNIT/ML FLEXPEN: 30.0000 [IU] | PEN_INJECTOR | Freq: Every day | SUBCUTANEOUS | 3 refills | Status: DC

## 2017-11-24 ENCOUNTER — Other Ambulatory Visit: Payer: Self-pay | Admitting: Medical

## 2017-11-24 DIAGNOSIS — Z86010 Personal history of colonic polyps: Secondary | ICD-10-CM | POA: Diagnosis not present

## 2017-11-24 DIAGNOSIS — E559 Vitamin D deficiency, unspecified: Secondary | ICD-10-CM

## 2017-11-24 DIAGNOSIS — K5904 Chronic idiopathic constipation: Secondary | ICD-10-CM | POA: Diagnosis not present

## 2017-11-24 DIAGNOSIS — E118 Type 2 diabetes mellitus with unspecified complications: Secondary | ICD-10-CM

## 2017-11-24 DIAGNOSIS — M810 Age-related osteoporosis without current pathological fracture: Secondary | ICD-10-CM

## 2017-11-24 MED ORDER — ALENDRONATE SODIUM 70 MG PO TABS: 70.0000 mg | ORAL_TABLET | ORAL | 3 refills | Status: DC

## 2017-11-24 MED ORDER — DAPAGLIFLOZIN PROPANEDIOL 5 MG PO TABS: 5.0000 mg | ORAL_TABLET | Freq: Every day | ORAL | 3 refills | Status: DC

## 2017-11-24 MED ORDER — LISINOPRIL 5 MG PO TABS: 5.0000 mg | ORAL_TABLET | Freq: Every day | ORAL | 3 refills | Status: DC

## 2017-11-24 MED ORDER — ROSUVASTATIN CALCIUM 20 MG PO TABS: 20.0000 mg | ORAL_TABLET | Freq: Every day | ORAL | 3 refills | Status: DC

## 2017-11-24 MED ORDER — VITAMIN D (ERGOCALCIFEROL) 1.25 MG (50000 UNIT) PO CAPS: 50000.0000 [IU] | ORAL_CAPSULE | ORAL | 3 refills | Status: DC

## 2017-11-24 MED ORDER — INSULIN DETEMIR 100 UNIT/ML FLEXPEN: 30.0000 [IU] | PEN_INJECTOR | Freq: Every day | SUBCUTANEOUS | 3 refills | Status: DC

## 2017-11-27 ENCOUNTER — Other Ambulatory Visit: Payer: Self-pay | Admitting: Medical

## 2017-11-28 NOTE — Telephone Encounter (Signed)
Pt called requesting #[facility] to [facility] until gets mail order Rx [place]

## 2017-11-28 NOTE — Telephone Encounter (Signed)
[facility] [employer].

## 2017-11-30 ENCOUNTER — Other Ambulatory Visit: Payer: Self-pay

## 2017-11-30 DIAGNOSIS — E118 Type 2 diabetes mellitus with unspecified complications: Secondary | ICD-10-CM

## 2017-11-30 DIAGNOSIS — E559 Vitamin D deficiency, unspecified: Secondary | ICD-10-CM

## 2017-11-30 DIAGNOSIS — M810 Age-related osteoporosis without current pathological fracture: Secondary | ICD-10-CM

## 2017-11-30 MED ORDER — VITAMIN D (ERGOCALCIFEROL) 1.25 MG (50000 UNIT) PO CAPS: 50000.0000 [IU] | ORAL_CAPSULE | ORAL | 3 refills | Status: DC

## 2017-11-30 MED ORDER — METFORMIN HCL ER 500 MG PO TB24
500.0000 mg | ORAL_TABLET | Freq: Every day | ORAL | 3 refills | Status: DC
Start: 2017-11-30 — End: 2017-12-26

## 2017-11-30 MED ORDER — GLUCOSE BLOOD VI STRP: ORAL_STRIP | 0 refills | Status: DC

## 2017-12-01 ENCOUNTER — Ambulatory Visit (INDEPENDENT_AMBULATORY_CARE_PROVIDER_SITE_OTHER): Payer: Medicare HMO | Admitting: Family Medicine

## 2017-12-01 VITALS — BP 120/60 | HR 78

## 2017-12-01 DIAGNOSIS — S0511XA Contusion of eyeball and orbital tissues, right eye, initial encounter: Secondary | ICD-10-CM | POA: Diagnosis not present

## 2017-12-01 DIAGNOSIS — S01111A Laceration without foreign body of right eyelid and periocular area, initial encounter: Secondary | ICD-10-CM

## 2017-12-01 NOTE — Progress Notes (Signed)
   Subjective:    Patient ID: Marissa Oliver, female    DOB: [birth year], 77 y.o.   MRN: [id number]  HPI This morning she fell while getting out of bed and injured the right lateral upper eyelid.  No loss of consciousness.  No previous history of falls.  No headache, blurred vision, double vision, weakness.   Review of Systems     Objective:   Physical Exam Alert and in no distress.  Lateral contusion with a healing 1-1/2 cm laceration is noted to the right lateral upper eyelid.  EOMI.  Conjunctivae and cornea normal.       Assessment & Plan:  Eye contusion, right, initial encounter  Right eyelid laceration, initial encounter Recommend conservative care with ice for 20 minutes 3 times per day.  Cautioned to always use cloth with the ice.  Tylenol for pain relief.  Switch to heat after 2 or 3 days.

## 2017-12-01 NOTE — Patient Instructions (Signed)
Ice for 20 minutes 3 times per day through a cloth.  Use Tylenol for pain relief.  After about 2 days then he can switch to using heat on it

## 2017-12-02 DIAGNOSIS — R69 Illness, unspecified: Secondary | ICD-10-CM | POA: Diagnosis not present

## 2017-12-26 ENCOUNTER — Other Ambulatory Visit: Payer: Self-pay | Admitting: Medical

## 2017-12-26 DIAGNOSIS — M810 Age-related osteoporosis without current pathological fracture: Secondary | ICD-10-CM

## 2017-12-26 DIAGNOSIS — E559 Vitamin D deficiency, unspecified: Secondary | ICD-10-CM

## 2017-12-26 DIAGNOSIS — Z1231 Encounter for screening mammogram for malignant neoplasm of breast: Secondary | ICD-10-CM | POA: Diagnosis not present

## 2017-12-26 DIAGNOSIS — E118 Type 2 diabetes mellitus with unspecified complications: Secondary | ICD-10-CM

## 2017-12-26 LAB — HM MAMMOGRAPHY

## 2017-12-26 MED ORDER — DAPAGLIFLOZIN PROPANEDIOL 5 MG PO TABS: 5.0000 mg | ORAL_TABLET | Freq: Every day | ORAL | 5 refills | Status: DC

## 2017-12-26 MED ORDER — LISINOPRIL 5 MG PO TABS: 5.0000 mg | ORAL_TABLET | Freq: Every day | ORAL | 5 refills | Status: DC

## 2017-12-26 MED ORDER — GLUCOSE BLOOD VI STRP: ORAL_STRIP | 1 refills | Status: DC

## 2017-12-26 MED ORDER — METFORMIN HCL ER 500 MG PO TB24
500.0000 mg | ORAL_TABLET | Freq: Every day | ORAL | 5 refills | Status: DC
Start: 2017-12-26 — End: 2018-08-22

## 2017-12-26 MED ORDER — ALENDRONATE SODIUM 70 MG PO TABS: 70.0000 mg | ORAL_TABLET | ORAL | 3 refills | Status: DC

## 2017-12-26 MED ORDER — ROSUVASTATIN CALCIUM 20 MG PO TABS: 20.0000 mg | ORAL_TABLET | Freq: Every day | ORAL | 5 refills | Status: DC

## 2017-12-26 NOTE — Telephone Encounter (Signed)
Is this okay to refill the Vitamin D as I do not see recent labs in here about vitamin D

## 2017-12-26 NOTE — Telephone Encounter (Signed)
Sent meds to local pharmacy

## 2017-12-26 NOTE — Telephone Encounter (Signed)
New Message   *STAT* If patient is at the pharmacy, call can be transferred to refill team.   1. Which medications need to be refilled? (please list name of each medication and dose if known)  alendronate (Fosamax) 70 mg tablet once daily dapagliflozin propanediol (Farxiga) 5 md take once daily Glucose Blood (one touch ultra test) Check once daily Lisinopril (Prinivil,Zestril) 5 mg tablet once daily Metformin (Glucophage XR) 500 mg tablet once daily with breakfast rosuvastatin (Crestor) 20 mg tablet once daily Vitamin D, Ergocalciferol, (Drisdol) 50000 units total once a week  2. Which pharmacy/location (including street and city if local pharmacy) is medication to be sent to? [facility], [address] [place], [place] [address]  3. Do they need a 30 day or 90 day supply?  30 day supply

## 2017-12-27 ENCOUNTER — Telehealth: Payer: Self-pay | Admitting: Medical

## 2017-12-27 NOTE — Telephone Encounter (Signed)
New Message   *STAT* If patient is at the pharmacy, call can be transferred to refill team.   1. Which medications need to be refilled? (please list name of each medication and dose if known)  Insulin Detemir (Levemir Flexpen) 100 unit/ml pen  2. Which pharmacy/location (including street and city if local pharmacy) is medication to be sent to? [facility], [address], [place], [place] [phone]  3. Do they need a 30 day or 90 day supply?  30 day supply

## 2017-12-28 ENCOUNTER — Other Ambulatory Visit: Payer: Self-pay

## 2017-12-28 DIAGNOSIS — Z961 Presence of intraocular lens: Secondary | ICD-10-CM | POA: Diagnosis not present

## 2017-12-28 DIAGNOSIS — H524 Presbyopia: Secondary | ICD-10-CM | POA: Diagnosis not present

## 2017-12-28 DIAGNOSIS — M810 Age-related osteoporosis without current pathological fracture: Secondary | ICD-10-CM

## 2017-12-28 DIAGNOSIS — E119 Type 2 diabetes mellitus without complications: Secondary | ICD-10-CM | POA: Diagnosis not present

## 2017-12-28 DIAGNOSIS — E559 Vitamin D deficiency, unspecified: Secondary | ICD-10-CM

## 2017-12-28 DIAGNOSIS — E118 Type 2 diabetes mellitus with unspecified complications: Secondary | ICD-10-CM

## 2017-12-28 LAB — HM DIABETES EYE EXAM

## 2017-12-28 MED ORDER — VITAMIN D (ERGOCALCIFEROL) 1.25 MG (50000 UNIT) PO CAPS: 50000.0000 [IU] | ORAL_CAPSULE | ORAL | 3 refills | Status: DC

## 2017-12-28 MED ORDER — INSULIN DETEMIR 100 UNIT/ML FLEXPEN: 20.0000 [IU] | PEN_INJECTOR | Freq: Every day | SUBCUTANEOUS | 3 refills | Status: DC

## 2017-12-28 NOTE — Telephone Encounter (Signed)
Called pt she wants to start getting her meds  Though locate pharmacy . [facility]

## 2018-01-02 ENCOUNTER — Encounter: Payer: Self-pay | Admitting: Medical

## 2018-01-29 ENCOUNTER — Encounter: Payer: Self-pay | Admitting: Medical

## 2018-01-31 DIAGNOSIS — R69 Illness, unspecified: Secondary | ICD-10-CM | POA: Diagnosis not present

## 2018-02-06 ENCOUNTER — Ambulatory Visit: Payer: Self-pay | Admitting: Medical

## 2018-02-09 ENCOUNTER — Other Ambulatory Visit: Payer: Self-pay

## 2018-02-25 ENCOUNTER — Encounter: Payer: Self-pay | Admitting: Medical

## 2018-02-28 ENCOUNTER — Ambulatory Visit: Payer: Self-pay | Admitting: Medical

## 2018-03-02 ENCOUNTER — Ambulatory Visit: Payer: Medicare HMO | Admitting: Medical

## 2018-03-03 ENCOUNTER — Encounter: Payer: Self-pay | Admitting: Medical

## 2018-03-03 ENCOUNTER — Ambulatory Visit (INDEPENDENT_AMBULATORY_CARE_PROVIDER_SITE_OTHER): Payer: Medicare HMO | Admitting: Medical

## 2018-03-03 VITALS — BP 128/62 | HR 62 | Ht 61.0 in | Wt 165.2 lb

## 2018-03-03 DIAGNOSIS — E785 Hyperlipidemia, unspecified: Secondary | ICD-10-CM | POA: Diagnosis not present

## 2018-03-03 DIAGNOSIS — Z794 Long term (current) use of insulin: Secondary | ICD-10-CM | POA: Diagnosis not present

## 2018-03-03 DIAGNOSIS — E1169 Type 2 diabetes mellitus with other specified complication: Secondary | ICD-10-CM | POA: Diagnosis not present

## 2018-03-03 LAB — POCT GLYCOSYLATED HEMOGLOBIN (HGB A1C): HEMOGLOBIN A1C: 8.3

## 2018-03-03 NOTE — Progress Notes (Signed)
Subjective Chief Complaint  Patient presents with  . Diabetes   Here for follow-up on diabetes. Today [birth year] her birthday.  Patient is checking home blood sugars.   Home blood sugar records: averaging 140-150s fasting Current symptoms include: none. Patient denies hyperglycemia, hypoglycemia , increased appetite, nausea, polydipsia, polyuria and visual disturbances.  Patient is checking their feet daily. Foot concerns (callous, ulcer, wound, thickened nails, toenail fungus, skin fungus, hammer toe): none Last dilated eye exam within a year.  Current treatments: farxiga 5mg  daily, Levemir 20 QHS, Metformin xR 500mg  daily Medication compliance: good  Current diet: some diet discreation but also sweets Current exercise: walking and mowing the yard  Hyperlipidemia- compliant with Crestor 20 mg daily without complaint  Compliant with lisinopril 5 mg daily for renal protection  The following portions of the patient's history were reviewed and updated as appropriate: allergies, current medications, past family history, past medical history, past social history, past surgical history and problem list.  ROS as in subjective above    Objective:   BP 128/62   Pulse 62   Ht 5\' 1"  (1.549 m)   Wt 165 lb 3.2 oz (74.9 kg)   SpO2 97%   BMI 31.21 kg/m   BP Readings from Last 3 Encounters:  03/03/18 128/62  12/01/17 120/60  11/22/17 130/80   Wt Readings from Last 3 Encounters:  03/03/18 165 lb 3.2 oz (74.9 kg)  11/22/17 166 lb 9.6 oz (75.6 kg)  08/18/17 170 lb 6.4 oz (77.3 kg)   General appearance: alert, no distress, WD/WN,  Neck: supple, no lymphadenopathy, no thyromegaly, no masses Heart: RRR, normal S1, S2, no murmurs Lungs: CTA bilaterally, no wheezes, rhonchi, or rales Pulses: 2+ symmetric, upper and lower extremities, normal cap refill    Assessment:   Encounter Diagnoses  Name Primary?  . Type 2 diabetes mellitus with other specified complication, with long-term current  use of insulin ([place]) Yes  . Dyslipidemia       Plan:   I wished her a happy birthday.   HgbA1C not at goal today.  C/t Farxiga 5mg  daily, Metformin 500mg  XR daily, increase Levemir to 23 u QHS up from 20.  Can increase 2 units per week.  Call report on sugars in 1 mo.   Diabetes Mellitus type 2: Education: Reviewed 'ABCs' of diabetes management (respective goals in parentheses):  A1C (<7), blood pressure (<130/80), and cholesterol (LDL <100)   Compliance at present is estimated to be good. Efforts to improve compliance (if necessary) will be directed at dietary modifications: less sweets, more strict diet.    Blood pressure: normal blood pressure .   An ACE/ARB is currently part of their treatment regimen.   Dyslipidemia under good control. .  A statin is currently part of their treatment regimen.  F/u with call report on sugar readings in 31mo.   Repeat visit in 42mo.

## 2018-03-24 ENCOUNTER — Other Ambulatory Visit: Payer: Self-pay | Admitting: Medical

## 2018-03-24 ENCOUNTER — Encounter: Payer: Self-pay | Admitting: Medical

## 2018-03-24 MED ORDER — INSULIN DEGLUDEC 100 UNIT/ML ~~LOC~~ SOPN: 20.0000 [IU] | PEN_INJECTOR | Freq: Every day | SUBCUTANEOUS | 2 refills | Status: DC

## 2018-03-24 MED ORDER — VITAMIN D 1000 UNITS PO TABS: 1000.0000 [IU] | ORAL_TABLET | Freq: Every day | ORAL | 3 refills | Status: DC

## 2018-04-09 DIAGNOSIS — R69 Illness, unspecified: Secondary | ICD-10-CM | POA: Diagnosis not present

## 2018-05-08 ENCOUNTER — Telehealth: Payer: Self-pay

## 2018-05-08 ENCOUNTER — Encounter: Payer: Self-pay | Admitting: Medical

## 2018-05-08 NOTE — Telephone Encounter (Signed)
Received voicemail from patient states that she needed Tresiba sample so I put 1 box back and made her an appointment for 05-22-18

## 2018-05-09 DIAGNOSIS — M810 Age-related osteoporosis without current pathological fracture: Secondary | ICD-10-CM | POA: Diagnosis not present

## 2018-05-09 DIAGNOSIS — M8589 Other specified disorders of bone density and structure, multiple sites: Secondary | ICD-10-CM | POA: Diagnosis not present

## 2018-05-09 LAB — HM DEXA SCAN

## 2018-05-09 MED ORDER — INSULIN DEGLUDEC 100 UNIT/ML ~~LOC~~ SOPN: 20.0000 [IU] | PEN_INJECTOR | Freq: Every day | SUBCUTANEOUS | 0 refills | Status: DC

## 2018-05-09 NOTE — Telephone Encounter (Signed)
1 sample was given per [facility]

## 2018-05-12 ENCOUNTER — Encounter: Payer: Self-pay | Admitting: Medical

## 2018-05-15 ENCOUNTER — Telehealth: Payer: Self-pay | Admitting: Medical

## 2018-05-15 NOTE — Telephone Encounter (Signed)
Her bone density scan does show improvement in overall bone density compared to the prior study in 2015.  If my mind serves me correctly I do not think she is taking Fosamax consistently year at the year since 2012 but I see prescription is going back as far as 2012.  If she thinks she has been taking Fosamax for most of 5 years consistently then we can take a drug holiday and stop this medicine for now.  If she is only been taking it for about a year or 2 then she should take it for couple more years

## 2018-05-15 NOTE — Telephone Encounter (Signed)
Left message for patient to call back for bone density results.

## 2018-05-16 ENCOUNTER — Telehealth: Payer: Self-pay

## 2018-05-16 NOTE — Telephone Encounter (Signed)
Patient notified of bone density results.

## 2018-05-16 NOTE — Telephone Encounter (Signed)
Patient left voicemail stating the she is coming in for A1C on Monday and would like to also have kidney level checked and potassium checked.  Would that be ok to put orders in for those test.

## 2018-05-17 ENCOUNTER — Other Ambulatory Visit: Payer: Self-pay | Admitting: Medical

## 2018-05-17 DIAGNOSIS — Z794 Long term (current) use of insulin: Secondary | ICD-10-CM

## 2018-05-17 DIAGNOSIS — E1169 Type 2 diabetes mellitus with other specified complication: Secondary | ICD-10-CM

## 2018-05-17 DIAGNOSIS — E785 Hyperlipidemia, unspecified: Secondary | ICD-10-CM

## 2018-05-17 NOTE — Telephone Encounter (Signed)
Orders are in

## 2018-05-22 ENCOUNTER — Ambulatory Visit (INDEPENDENT_AMBULATORY_CARE_PROVIDER_SITE_OTHER): Payer: Medicare HMO | Admitting: Medical

## 2018-05-22 ENCOUNTER — Encounter: Payer: Self-pay | Admitting: Medical

## 2018-05-22 VITALS — BP 110/64 | HR 66 | Temp 97.6°F | Resp 16 | Ht 60.0 in | Wt 166.6 lb

## 2018-05-22 DIAGNOSIS — E1169 Type 2 diabetes mellitus with other specified complication: Secondary | ICD-10-CM | POA: Diagnosis not present

## 2018-05-22 DIAGNOSIS — Z794 Long term (current) use of insulin: Secondary | ICD-10-CM | POA: Diagnosis not present

## 2018-05-22 DIAGNOSIS — M810 Age-related osteoporosis without current pathological fracture: Secondary | ICD-10-CM | POA: Diagnosis not present

## 2018-05-22 DIAGNOSIS — E785 Hyperlipidemia, unspecified: Secondary | ICD-10-CM

## 2018-05-22 LAB — COMPREHENSIVE METABOLIC PANEL
A/G RATIO: 2.2 (ref 1.2–2.2)
ALT: 16 IU/L (ref 0–32)
AST: 15 IU/L (ref 0–40)
Albumin: 4.6 g/dL (ref 3.5–4.8)
Alkaline Phosphatase: 87 IU/L (ref 39–117)
BILIRUBIN TOTAL: 0.3 mg/dL (ref 0.0–1.2)
BUN/Creatinine Ratio: 24 (ref 12–28)
BUN: 17 mg/dL (ref 8–27)
CHLORIDE: 104 mmol/L (ref 96–106)
CO2: 21 mmol/L (ref 20–29)
Calcium: 9.7 mg/dL (ref 8.7–10.3)
Creatinine, Ser: 0.7 mg/dL (ref 0.57–1.00)
GFR calc non Af Amer: 83 mL/min/{1.73_m2} (ref >59)
GFR, EST AFRICAN AMERICAN: 95 mL/min/{1.73_m2} (ref >59)
Globulin, Total: 2.1 g/dL (ref 1.5–4.5)
Glucose: 167 mg/dL — ABNORMAL HIGH (ref 65–99)
Potassium: 4 mmol/L (ref 3.5–5.2)
SODIUM: 140 mmol/L (ref 134–144)
Total Protein: 6.7 g/dL (ref 6.0–8.5)

## 2018-05-22 LAB — HEMOGLOBIN A1C
ESTIMATED AVERAGE GLUCOSE: 177 mg/dL
Hgb A1c MFr Bld: 7.8 % — ABNORMAL HIGH (ref 4.8–5.6)

## 2018-05-22 NOTE — Progress Notes (Signed)
Subjective Chief Complaint  Patient presents with  . diabetes    diabetes check  fasting wants kidney tested   Here for follow-up on diabetes and chronic issues.    Concerned about her medication costs.  Wilder Glade and Tyler Aas are too expensive.   In the last month we gave sample of Tresiba after she ran out of Levemir and it was too expensive.  Patient is checking home blood sugars.   Home blood sugar records: averaging 140-150s fasting Current symptoms include: none. Patient denies hyperglycemia, hypoglycemia , increased appetite, nausea, polydipsia, polyuria and visual disturbances.  Patient is checking their feet daily. Foot concerns (callous, ulcer, wound, thickened nails, toenail fungus, skin fungus, hammer toe): none Last dilated eye exam within a year.   Current treatments: farxiga 5mg  daily, Tresiba 20 QHS sample, but was on Levemir sample prior to running out, Metformin xR 500mg  daily Medication compliance: good  Current diet: some diet discreation but also sweets Current exercise: walking and mowing the yard   Hyperlipidemia- compliant with Crestor 20 mg daily without complaint, and this one doesn't cost her  Compliant with lisinopril 5 mg daily for renal protection  The following portions of the patient's history were reviewed and updated as appropriate: allergies, current medications, past family history, past medical history, past social history, past surgical history and problem list.  ROS as in subjective above    Objective:   BP 110/64   Pulse 66   Temp 97.6 F (36.4 C) (Oral)   Resp 16   Ht 5' (1.524 m)   Wt 166 lb 9.6 oz (75.6 kg)   SpO2 96%   BMI 32.54 kg/m   BP Readings from Last 3 Encounters:  05/22/18 110/64  03/03/18 128/62  12/01/17 120/60   Wt Readings from Last 3 Encounters:  05/22/18 166 lb 9.6 oz (75.6 kg)  03/03/18 165 lb 3.2 oz (74.9 kg)  11/22/17 166 lb 9.6 oz (75.6 kg)   General appearance: alert, no distress, WD/WN,  Neck: supple,  no lymphadenopathy, no thyromegaly, no masses Heart: RRR, normal S1, S2, no murmurs Lungs: CTA bilaterally, no wheezes, rhonchi, or rales Pulses: 2+ symmetric, upper and lower extremities, normal cap refill   Diabetic Foot Exam - Simple   Simple Foot Form Diabetic Foot exam was performed with the following findings:  Yes 05/22/2018 11:22 AM  Visual Inspection No deformities, no ulcerations, no other skin breakdown bilaterally:  Yes Sensation Testing Intact to touch and monofilament testing bilaterally:  Yes Pulse Check See comments:  Yes Comments 1+ pedal pulses       Assessment:   Encounter Diagnoses  Name Primary?  . Type 2 diabetes mellitus with other specified complication, with long-term current use of insulin ([place]) Yes  . Osteoporosis, unspecified osteoporosis type, unspecified pathological fracture presence   . Dyslipidemia       Plan:   Labs today.  C/t Farxiga 5mg  daily, Metformin 500mg  XR daily, currently taking Tresiba to 20 u QHS  Diabetes Mellitus type 2: Education: Reviewed 'ABCs' of diabetes management (respective goals in parentheses):  A1C (<7), blood pressure (<130/80), and cholesterol (LDL <100)   Compliance at present is estimated to be good. Efforts to improve compliance (if necessary) will be directed at dietary modifications: less sweets, more strict diet.    Blood pressure: normal blood pressure .   An ACE/ARB is currently part of their treatment regimen.   Dyslipidemia under good control. .  A statin is currently part of their treatment regimen.  Gave samples today for Lisabeth Pick, [place].  F/u pending labs  Nikiah was seen today for diabetes.  Diagnoses and all orders for this visit:  Type 2 diabetes mellitus with other specified complication, with long-term current use of insulin (HCC) -     Comprehensive metabolic panel -     Hemoglobin A1c  Osteoporosis, unspecified osteoporosis type, unspecified pathological fracture  presence  Dyslipidemia -     Comprehensive metabolic panel -     Hemoglobin A1c

## 2018-05-23 ENCOUNTER — Other Ambulatory Visit: Payer: Self-pay | Admitting: Medical

## 2018-05-23 MED ORDER — INSULIN GLARGINE 100 UNIT/ML SOLOSTAR PEN: 25.0000 [IU] | PEN_INJECTOR | Freq: Every day | SUBCUTANEOUS | 5 refills | Status: DC

## 2018-05-23 MED ORDER — "NEEDLE (DISP) 30G X 1/2"" MISC": 1.0000 | Freq: Every day | 11 refills | Status: DC

## 2018-05-24 ENCOUNTER — Encounter: Payer: Self-pay | Admitting: Medical

## 2018-05-30 ENCOUNTER — Other Ambulatory Visit: Payer: Self-pay

## 2018-05-30 DIAGNOSIS — Z794 Long term (current) use of insulin: Secondary | ICD-10-CM

## 2018-05-30 DIAGNOSIS — E1169 Type 2 diabetes mellitus with other specified complication: Secondary | ICD-10-CM

## 2018-05-30 MED ORDER — INSULIN DETEMIR 100 UNIT/ML FLEXPEN: 25.0000 [IU] | PEN_INJECTOR | Freq: Every day | SUBCUTANEOUS | 2 refills | Status: DC

## 2018-05-30 MED ORDER — GLUCOSE BLOOD VI STRP: ORAL_STRIP | 3 refills | Status: DC

## 2018-05-31 DIAGNOSIS — R69 Illness, unspecified: Secondary | ICD-10-CM | POA: Diagnosis not present

## 2018-06-12 ENCOUNTER — Telehealth: Payer: Self-pay

## 2018-06-12 MED ORDER — DAPAGLIFLOZIN PROPANEDIOL 5 MG PO TABS: 5.0000 mg | ORAL_TABLET | Freq: Every day | ORAL | 0 refills | Status: DC

## 2018-06-12 NOTE — Telephone Encounter (Signed)
Pt called to get samples of Farxiga. Patient is given a month supply

## 2018-06-29 ENCOUNTER — Other Ambulatory Visit: Payer: Self-pay | Admitting: Medical

## 2018-07-05 ENCOUNTER — Other Ambulatory Visit: Payer: Self-pay | Admitting: Medical

## 2018-07-10 ENCOUNTER — Telehealth: Payer: Self-pay

## 2018-07-10 NOTE — Telephone Encounter (Signed)
Patient called and left message on my voicemail wanting farxiga samples.  Is this ok?

## 2018-07-10 NOTE — Telephone Encounter (Signed)
Samples at front for patient and she will pick up today or tomorrow

## 2018-07-10 NOTE — Telephone Encounter (Signed)
Yes can give samples if we have them

## 2018-07-28 ENCOUNTER — Other Ambulatory Visit: Payer: Self-pay | Admitting: Medical

## 2018-07-28 DIAGNOSIS — E118 Type 2 diabetes mellitus with unspecified complications: Secondary | ICD-10-CM

## 2018-08-07 ENCOUNTER — Telehealth: Payer: Self-pay

## 2018-08-07 NOTE — Telephone Encounter (Signed)
Patient called wanting farxiga 5 mg samples.   I gave her samples.

## 2018-08-22 ENCOUNTER — Ambulatory Visit (INDEPENDENT_AMBULATORY_CARE_PROVIDER_SITE_OTHER): Payer: Medicare HMO | Admitting: Medical

## 2018-08-22 ENCOUNTER — Encounter: Payer: Self-pay | Admitting: Medical

## 2018-08-22 VITALS — BP 120/70 | HR 57 | Temp 97.6°F | Resp 16 | Ht 60.0 in | Wt 168.2 lb

## 2018-08-22 DIAGNOSIS — M810 Age-related osteoporosis without current pathological fracture: Secondary | ICD-10-CM

## 2018-08-22 DIAGNOSIS — E1169 Type 2 diabetes mellitus with other specified complication: Secondary | ICD-10-CM | POA: Diagnosis not present

## 2018-08-22 DIAGNOSIS — Z23 Encounter for immunization: Secondary | ICD-10-CM

## 2018-08-22 DIAGNOSIS — Z794 Long term (current) use of insulin: Secondary | ICD-10-CM

## 2018-08-22 DIAGNOSIS — Z7189 Other specified counseling: Secondary | ICD-10-CM | POA: Diagnosis not present

## 2018-08-22 DIAGNOSIS — Z0000 Encounter for general adult medical examination without abnormal findings: Secondary | ICD-10-CM

## 2018-08-22 DIAGNOSIS — R69 Illness, unspecified: Secondary | ICD-10-CM | POA: Diagnosis not present

## 2018-08-22 DIAGNOSIS — E118 Type 2 diabetes mellitus with unspecified complications: Secondary | ICD-10-CM

## 2018-08-22 DIAGNOSIS — Z129 Encounter for screening for malignant neoplasm, site unspecified: Secondary | ICD-10-CM

## 2018-08-22 DIAGNOSIS — L989 Disorder of the skin and subcutaneous tissue, unspecified: Secondary | ICD-10-CM

## 2018-08-22 DIAGNOSIS — E785 Hyperlipidemia, unspecified: Secondary | ICD-10-CM

## 2018-08-22 DIAGNOSIS — R32 Unspecified urinary incontinence: Secondary | ICD-10-CM | POA: Diagnosis not present

## 2018-08-22 DIAGNOSIS — N3281 Overactive bladder: Secondary | ICD-10-CM | POA: Diagnosis not present

## 2018-08-22 DIAGNOSIS — Z86010 Personal history of colonic polyps: Secondary | ICD-10-CM | POA: Diagnosis not present

## 2018-08-22 DIAGNOSIS — F411 Generalized anxiety disorder: Secondary | ICD-10-CM | POA: Diagnosis not present

## 2018-08-22 DIAGNOSIS — E559 Vitamin D deficiency, unspecified: Secondary | ICD-10-CM

## 2018-08-22 DIAGNOSIS — Z7185 Encounter for immunization safety counseling: Secondary | ICD-10-CM

## 2018-08-22 DIAGNOSIS — N952 Postmenopausal atrophic vaginitis: Secondary | ICD-10-CM

## 2018-08-22 MED ORDER — METFORMIN HCL ER 500 MG PO TB24: 500.0000 mg | ORAL_TABLET | Freq: Every day | ORAL | 3 refills | Status: DC

## 2018-08-22 MED ORDER — INSULIN PEN NEEDLE 32G X 4 MM MISC: 1.0000 | Freq: Every day | 11 refills | Status: DC

## 2018-08-22 MED ORDER — ROSUVASTATIN CALCIUM 20 MG PO TABS: 20.0000 mg | ORAL_TABLET | Freq: Every day | ORAL | 3 refills | Status: DC

## 2018-08-22 MED ORDER — LISINOPRIL 5 MG PO TABS: 5.0000 mg | ORAL_TABLET | Freq: Every day | ORAL | 3 refills | Status: DC

## 2018-08-22 MED ORDER — DAPAGLIFLOZIN PROPANEDIOL 10 MG PO TABS: 10.0000 mg | ORAL_TABLET | Freq: Every day | ORAL | 3 refills | Status: DC

## 2018-08-22 MED ORDER — INSULIN DETEMIR 100 UNIT/ML FLEXPEN: 30.0000 [IU] | PEN_INJECTOR | Freq: Every day | SUBCUTANEOUS | 11 refills | Status: DC

## 2018-08-22 NOTE — Progress Notes (Signed)
Subjective:    Marissa Oliver is a 77 y.o. female who presents for Preventative Services visit and chronic medical problems/med check visit.    Primary Care Provider   Charlottie Peragine, Camelia Eng, PA-C here for primary care   Current Health Care Team:  Eye doctor, Dr. Lady [facility]  Glade Lloyd, Sidnee [facility], PA-C  GYN, Dr Toney Rakes  Nephrology, Dr. Consuella Lose  Dermatology    Dr. Acie Fredrickson, cardiology   Medical Services you may have received from other than [facility] providers in the past year (date may be approximate) none  Exercise Current exercise habits:walking  Nutrition/Diet Current diet: healthy  Depression Screen Depression screen [facility] 2/9 03/03/2018  Decreased Interest 1  Down, Depressed, Hopeless 1  PHQ - 2 Score 2  Altered sleeping 0  Tired, decreased energy 1  Change in appetite 0  Feeling bad or failure about yourself  0  Trouble concentrating 0  Moving slowly or fidgety/restless 0  Suicidal thoughts 0  PHQ-9 Score 3  Difficult doing work/chores Somewhat difficult    Activities of Daily Living Screen/Functional Status Survey Is the patient deaf or have difficulty hearing?: No Does the patient have difficulty seeing, even when wearing glasses/contacts?: No Does the patient have difficulty concentrating, remembering, or making decisions?: No Does the patient have difficulty walking or climbing stairs?: No Does the patient have difficulty dressing or bathing?: No Does the patient have difficulty doing errands alone such as visiting a doctor's office or shopping?: Yes   Fall Risk Screen Fall Risk  03/03/2018 02/16/2017 04/29/2016 07/15/2014  Falls in the past year? Yes No No No  Number falls in past yr: 1 - - -  Injury with Fall? Yes - - -    Gait Assessment: Normal gait observed: yes   Past Medical History:  Diagnosis Date  . Atrophic vaginitis    estrace cream rx'd by Dr. Karsten Ro  . Colon polyp    tubular adenoma (colonoscopy q5 yrs)  . Diabetes mellitus  2005  . Diverticulosis   . Dyslipidemia   . GAD (generalized anxiety disorder)   . Hemorrhoids   . OAB (overactive bladder)   . Osteoporosis   . Psoriasis    mild, sees [facility]  . Smoker     Past Surgical History:  Procedure Laterality Date  . ABDOMINAL HYSTERECTOMY  1972   partial  . APPENDECTOMY    . CARDIOVASCULAR STRESS TEST  12/25/2004   EF 65%. OVERALL NORMAL STRESS CARDIOLITE. NO EVIDENCE  OF ISCHEMIA  . COLONOSCOPY  08/2010   cologuard 01/2016 normal  . PUBOVAGINAL SLING  04/1998  . TUBAL LIGATION      Social History   Socioeconomic History  . Marital status: Divorced    Spouse name: Not on file  . Number of children: Not on file  . Years of education: Not on file  . Highest education level: Not on file  Occupational History  . Not on file  Social Needs  . Financial resource strain: Not on file  . Food insecurity:    Worry: Not on file    Inability: Not on file  . Transportation needs:    Medical: Not on file    Non-medical: Not on file  Tobacco Use  . Smoking status: Former Smoker    Packs/day: 0.50    Last attempt to quit: 05/22/2012    Years since quitting: 6.2  . Smokeless tobacco: Never Used  Substance and Sexual Activity  . Alcohol use: No    Alcohol/week: 0.0  standard drinks  . Drug use: No  . Sexual activity: Not Currently  Lifestyle  . Physical activity:    Days per week: Not on file    Minutes per session: Not on file  . Stress: Not on file  Relationships  . Social connections:    Talks on phone: Not on file    Gets together: Not on file    Attends religious service: Not on file    Active member of club or organization: Not on file    Attends meetings of clubs or organizations: Not on file    Relationship status: Not on file  . Intimate partner violence:    Fear of current or ex partner: Not on file    Emotionally abused: Not on file    Physically abused: Not on file    Forced sexual activity: Not on file  Other Topics  Concern  . 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  Has 3 children.  Exercises some with walking.  Per 08/2018    Family History  Problem Relation Age of Onset  . Arthritis Mother   . Heart failure Mother   . Mental illness Father   . Kidney disease Father   . Stroke Father   . Heart attack Father   . Heart disease Brother   . Cancer Brother        prostate  . Stroke Maternal Aunt   . Cancer Maternal Uncle   . Stroke Paternal Grandmother      Current Outpatient Medications:  .  alendronate (FOSAMAX) 70 MG tablet, Take 1 tablet (70 mg total) by mouth every 7 (seven) days. Take with a full glass of water on an empty stomach., Disp: 12 tablet, Rfl: 3 .  cholecalciferol (VITAMIN D) 1000 units tablet, Take 1 tablet (1,000 Units total) by mouth daily., Disp: 90 tablet, Rfl: 3 .  dapagliflozin propanediol (FARXIGA) 5 MG TABS tablet, Take 5 mg by mouth daily., Disp: 28 tablet, Rfl: 0 .  glucose blood (ONE TOUCH ULTRA TEST) test strip, CHECK TWICE DAILY, Disp: 200 each, Rfl: 3 .  glucose blood (ONE TOUCH ULTRA TEST) test strip, USE AS DIRECTED TO TEST BLOOD SUGARS ONCE A DAY, Disp: 100 each, Rfl: 3 .  Insulin Detemir (LEVEMIR) 100 UNIT/ML Pen, Inject 25 Units into the skin daily., Disp: 15 mL, Rfl: 2 .  Insulin Glargine (LANTUS) 100 UNIT/ML Solostar Pen, Inject 25 Units into the skin at bedtime., Disp: 15 mL, Rfl: 5 .  Insulin Pen Needle (BD PEN NEEDLE NANO U/F) 32G X 4 MM MISC, 1 each by Does not apply route daily., Disp: 100 each, Rfl: 11 .  lisinopril (PRINIVIL,ZESTRIL) 5 MG tablet, TAKE 1 TABLET BY MOUTH EVERY DAY, Disp: 90 tablet, Rfl: 0 .  metFORMIN (GLUCOPHAGE XR) 500 MG 24 hr tablet, Take 1 tablet (500 mg total) by mouth daily with breakfast., Disp: 30 tablet, Rfl: 5 .  NEEDLE, DISP, 30 G (BD DISP NEEDLES) 30G X 1/2" MISC, 1 each by Does not apply route at bedtime., Disp: 100 each, Rfl: 11 .  rosuvastatin (CRESTOR) 20 MG  tablet, Take 1 tablet (20 mg total) by mouth daily., Disp: 30 tablet, Rfl: 5 .  Linaclotide (LINZESS PO), Take by mouth., Disp: , Rfl:   No Known Allergies  History reviewed: allergies, current medications, past family history, past medical history, past social history, past surgical history and problem list  Chronic issues discussed:  Compliant with medications  Acute issues discussed: none  Objective:      Biometrics BP 120/70   Pulse (!) 57   Temp 97.6 F (36.4 C) (Oral)   Resp 16   Ht 5' (1.524 m)   Wt 168 lb 3.2 oz (76.3 kg)   SpO2 98%   BMI 32.85 kg/m    Wt Readings from Last 3 Encounters:  08/22/18 168 lb 3.2 oz (76.3 kg)  05/22/18 166 lb 9.6 oz (75.6 kg)  03/03/18 165 lb 3.2 oz (74.9 kg)   BP Readings from Last 3 Encounters:  08/22/18 120/70  05/22/18 110/64  03/03/18 128/62    Gen: wd, wn, nad HEENT: normocephalic, sclerae anicteric, TMs pearly, nares patent, no discharge or erythema, pharynx normal Oral cavity: MMM, no lesions Neck: supple, no lymphadenopathy, no thyromegaly, no masses, no bruits Heart: RRR, normal S1, S2, no murmurs Lungs: CTA bilaterally, no wheezes, rhonchi, or rales Abdomen: +bs, soft, non tender, non distended, no masses, no hepatomegaly, no splenomegaly Musculoskeletal: nontender, no swelling, no obvious deformity Extremities: no edema, no cyanosis, no clubbing Pulses: 2+ symmetric, upper and lower extremities, normal cap refill Neurological: alert, oriented x 3, CN2-12 intact, strength normal upper extremities and lower extremities, sensation normal throughout, DTRs 2+ throughout, no cerebellar signs, gait normal Psychiatric: normal affect, behavior normal, pleasant  Breast/gyn/rectal - declined   Assessment:   Encounter Diagnoses  Name Primary?  . Type 2 diabetes mellitus with other specified complication, with long-term current use of insulin ([place]) Yes  . Need for influenza vaccination   . Medicare annual wellness visit,  subsequent   . Encounter for health maintenance examination in adult   . Generalized anxiety disorder   . History of colonic polyps   . Urinary incontinence, unspecified type   . Vitamin D deficiency   . Vaccine counseling   . Dyslipidemia   . Overactive bladder   . Atrophic vaginitis   . Osteoporosis, unspecified osteoporosis type, unspecified pathological fracture presence   . Screening for cancer   . Skin lesions      Plan:   A preventative services visit was completed today.  During the course of the visit today, we discussed and counseled about appropriate screening and preventive services.  A health risk assessment was established today that included a review of current medications, allergies, social history, family history, medical and preventative health history, biometrics, and preventative screenings to identify potential safety concerns or impairments.  A personalized plan was printed today for your records and use.   Personalized health advice and education was given today to reduce health risks and promote self management and wellness.  Information regarding end of life planning was discussed today.  Cancer screening: Will get copy of recent mammogram with [facility]. reviewed last colonoscopy report   Conditions/risks identified: Urinary incontinence - advised urology follow up  Chronic problems discussed today: Hyperlipidemia - she declines lab today.   Will plan to do lipid, micro albumin and CBC in 11/2018.  C/t same medication  Diabetes - HgbA1C >8% today.  Increase Levemir to 30u QHS, increase Farxiga to 10mg  daily.  C/t metformin.  C/t glucose monitoring, daily foot checks.    C/t lisinopril for renal protection.   Osteoporosis - c/t fosamax.  Reviewed most recent bone density test results .   Advise weight bearing and aeroic exercise, Vit D and calcium supplementation.    Acute problems discussed today: none  Recommendations:  I recommend a yearly  ophthalmology/optometry visit for glaucoma  screening and eye checkup  I recommended a yearly dental visit for hygiene and checkup  Advanced directives - discussed nature and purpose of Advanced Directives, encouraged them to complete them if they have not done so and/or encouraged them to get [place] a copy if they have done this already.  Referrals today: Consider urology consult given ongoing problems with bladder, incontinence  Immunizations: I recommended a yearly influenza vaccine, typically in September when the vaccine is usually available Is the Pneumococcal vaccine up to date: yes. Is the Shingles vaccine up to date: no.  Counseled on vaccine. Is the Td/Tdap vaccine up to date: yes. Counseled on the influenza virus vaccine.  Vaccine information sheet given.   High dose Influenza vaccine given after consent obtained.  Mackenna was seen today for awv.  Diagnoses and all orders for this visit:  Type 2 diabetes mellitus with other specified complication, with long-term current use of insulin (HCC) -     HgB A1c -     Insulin Detemir (LEVEMIR) 100 UNIT/ML Pen; Inject 30 Units into the skin daily.  Need for influenza vaccination -     Flu vaccine HIGH DOSE PF (Fluzone High Dose)  Medicare annual wellness visit, subsequent  Encounter for health maintenance examination in adult  Generalized anxiety disorder  History of colonic polyps  Urinary incontinence, unspecified type  Vitamin D deficiency  Vaccine counseling  Dyslipidemia  Overactive bladder  Atrophic vaginitis  Osteoporosis, unspecified osteoporosis type, unspecified pathological fracture presence  Screening for cancer  Skin lesions  Type 2 diabetes mellitus with complication, without long-term current use of insulin (HCC) -     lisinopril (PRINIVIL,ZESTRIL) 5 MG tablet; Take 1 tablet (5 mg total) by mouth daily. -     metFORMIN (GLUCOPHAGE XR) 500 MG 24 hr tablet; Take 1 tablet (500 mg total) by mouth daily  with breakfast.  Other orders -     rosuvastatin (CRESTOR) 20 MG tablet; Take 1 tablet (20 mg total) by mouth daily. -     Insulin Pen Needle (BD PEN NEEDLE NANO U/F) 32G X 4 MM MISC; 1 each by Does not apply route daily. -     dapagliflozin propanediol (FARXIGA) 10 MG TABS tablet; Take 10 mg by mouth daily.    Medicare Attestation A preventative services visit was completed today.  During the course of the visit the patient was educated and counseled about appropriate screening and preventive services.  A health risk assessment was established with the patient that included a review of current medications, allergies, social history, family history, medical and preventative health history, biometrics, and preventative screenings to identify potential safety concerns or impairments.  A personalized plan was printed today for the patient's records and use.   Personalized health advice and education was given today to reduce health risks and promote self management and wellness.  Information regarding end of life planning was discussed today.  Dorothea Ogle, PA-C   08/22/2018

## 2018-08-22 NOTE — Patient Instructions (Signed)
Recommendations:  Your diabetes marker is too high today, over 7%   Increase your Farxiga to 10 mg daily.  I sent this to the pharmacy  Increase the Levemir long-acting insulin from 25 to 30 units nightly  Continue all other medicines as usual  Continue to check your sugars daily with goal being less than 130  Exercise at least 150 minutes a week with a walking or other aerobic activity  See your eye doctor every year, see your dentist every year  Make sure the eye doctor sends [place] a copy of your diabetic eye exam  I recommend we refer you to a dietitian for an updated discussion of healthy eating habits  Get me a copy of your advanced directives if you have not done so already.  This is the living will and healthcare power of attorney  Thanks for trusting [place] with your health care and for coming in for a physical today.  Below are some general recommendations I have for you:  Yearly screenings See your eye doctor yearly for routine vision care. See your dentist yearly for routine dental care including hygiene visits twice yearly. See me here yearly for a routine physical and preventative care visit   Preventative Care for Adults - Female      MAINTAIN REGULAR HEALTH EXAMS:  A routine yearly physical is a good way to check in with your primary care provider about your health and preventive screening. It is also an opportunity to share updates about your health and any concerns you have, and receive a thorough all-over exam.   Most health insurance companies pay for at least some preventative services.  Check with your health plan for specific coverages.  WHAT PREVENTATIVE SERVICES DO WOMEN NEED?  Adult women should have their weight and blood pressure checked regularly.   Women age 8 and older should have their cholesterol levels checked regularly.  Women should be screened for cervical cancer with a Pap smear and pelvic exam beginning at either age 37, or 3 years after  they become sexually activity.    Breast cancer screening generally begins at age 18 with a mammogram and breast exam by your primary care provider.    Beginning at age 52 and continuing to age 57, women should be screened for colorectal cancer.  Certain people may need continued testing until age 81.  Updating vaccinations is part of preventative care.  Vaccinations help protect against diseases such as the flu.  Osteoporosis is a disease in which the bones lose minerals and strength as we age. Women ages 23 and over should discuss this with their caregivers, as should women after menopause who have other risk factors.  Lab tests are generally done as part of preventative care to screen for anemia and blood disorders, to screen for problems with the kidneys and liver, to screen for bladder problems, to check blood sugar, and to check your cholesterol level.  Preventative services generally include counseling about diet, exercise, avoiding tobacco, drugs, excessive alcohol consumption, and sexually transmitted infections.    GENERAL RECOMMENDATIONS FOR GOOD HEALTH:  Healthy diet:  Eat a variety of foods, including fruit, vegetables, animal or vegetable protein, such as meat, fish, chicken, and eggs, or beans, lentils, tofu, and grains, such as rice.  Drink plenty of water daily.  Decrease saturated fat in the diet, avoid lots of red meat, processed foods, sweets, fast foods, and fried foods.  Exercise:  Aerobic exercise helps maintain good heart health. At least  30-40 minutes of moderate-intensity exercise is recommended. For example, a brisk walk that increases your heart rate and breathing. This should be done on most days of the week.   Find a type of exercise or a variety of exercises that you enjoy so that it becomes a part of your daily life.  Examples are running, walking, swimming, water aerobics, and biking.  For motivation and support, explore group exercise such as aerobic  class, spin class, Zumba, Yoga,or  martial arts, etc.    Set exercise goals for yourself, such as a certain weight goal, walk or run in a race such as a 5k walk/run.  Speak to your primary care provider about exercise goals.  Disease prevention:  If you smoke or chew tobacco, find out from your caregiver how to quit. It can literally save your life, no matter how long you have been a tobacco user. If you do not use tobacco, never begin.   Maintain a healthy diet and normal weight. Increased weight leads to problems with blood pressure and diabetes.   The Body Mass Index or BMI is a way of measuring how much of your body is fat. Having a BMI above 27 increases the risk of heart disease, diabetes, hypertension, stroke and other problems related to obesity. Your caregiver can help determine your BMI and based on it develop an exercise and dietary program to help you achieve or maintain this important measurement at a healthful level.  High blood pressure causes heart and blood vessel problems.  Persistent high blood pressure should be treated with medicine if weight loss and exercise do not work.   Fat and cholesterol leaves deposits in your arteries that can block them. This causes heart disease and vessel disease elsewhere in your body.  If your cholesterol is found to be high, or if you have heart disease or certain other medical conditions, then you may need to have your cholesterol monitored frequently and be treated with medication.   Ask if you should have a cardiac stress test if your history suggests this. A stress test is a test done on a treadmill that looks for heart disease. This test can find disease prior to there being a problem.  Menopause can be associated with physical symptoms and risks. Hormone replacement therapy is available to decrease these. You should talk to your caregiver about whether starting or continuing to take hormones is right for you.   Osteoporosis is a disease  in which the bones lose minerals and strength as we age. This can result in serious bone fractures. Risk of osteoporosis can be identified using a bone density scan. Women ages 73 and over should discuss this with their caregivers, as should women after menopause who have other risk factors. Ask your caregiver whether you should be taking a calcium supplement and Vitamin D, to reduce the rate of osteoporosis.   Avoid drinking alcohol in excess (more than two drinks per day).  Avoid use of street drugs. Do not share needles with anyone. Ask for professional help if you need assistance or instructions on stopping the use of alcohol, cigarettes, and/or drugs.  Brush your teeth twice a day with fluoride toothpaste, and floss once a day. Good oral hygiene prevents tooth decay and gum disease. The problems can be painful, unattractive, and can cause other health problems. Visit your dentist for a routine oral and dental check up and preventive care every 6-12 months.   Look at your skin regularly.  Use  a mirror to look at your back. Notify your caregivers of changes in moles, especially if there are changes in shapes, colors, a size larger than a pencil eraser, an irregular border, or development of new moles.  Safety:  Use seatbelts 100% of the time, whether driving or as a passenger.  Use safety devices such as hearing protection if you work in environments with loud noise or significant background noise.  Use safety glasses when doing any work that could send debris in to the eyes.  Use a helmet if you ride a bike or motorcycle.  Use appropriate safety gear for contact sports.  Talk to your caregiver about gun safety.  Use sunscreen with a SPF (or skin protection factor) of 15 or greater.  Lighter skinned people are at a greater risk of skin cancer. Don't forget to also wear sunglasses in order to protect your eyes from too much damaging sunlight. Damaging sunlight can accelerate cataract formation.    Practice safe sex. Use condoms. Condoms are used for birth control and to help reduce the spread of sexually transmitted infections (or STIs).  Some of the STIs are gonorrhea (the clap), chlamydia, syphilis, trichomonas, herpes, HPV (human papilloma virus) and HIV (human immunodeficiency virus) which causes AIDS. The herpes, HIV and HPV are viral illnesses that have no cure. These can result in disability, cancer and death.   Keep carbon monoxide and smoke detectors in your home functioning at all times. Change the batteries every 6 months or use a model that plugs into the wall.   Vaccinations:  Stay up to date with your tetanus shots and other required immunizations. You should have a booster for tetanus every 10 years. Be sure to get your flu shot every year, since 5%-20% of the [place] population comes down with the flu. The flu vaccine changes each year, so being vaccinated once is not enough. Get your shot in the fall, before the flu season peaks.   Other vaccines to consider:  Human Papilloma Virus or HPV causes cancer of the cervix, and other infections that can be transmitted from person to person. There is a vaccine for HPV, and females should get immunized between the ages of 74 and 10. It requires a series of 3 shots.   Pneumococcal vaccine to protect against certain types of pneumonia.  This is normally recommended for adults age 63 or older.  However, adults younger than 79 years old with certain underlying conditions such as diabetes, heart or lung disease should also receive the vaccine.  Shingles vaccine to protect against Varicella Zoster if you are older than age 36, or younger than 79 years old with certain underlying illness.  If you have not had the Shingrix vaccine, please call your insurer to inquire about coverage for the Shingrix vaccine given in 2 doses.   Some insurers cover this vaccine after age 36, some cover this after age 55.  If your insurer covers this, then call to  schedule appointment to have this vaccine here  Hepatitis A vaccine to protect against a form of infection of the liver by a virus acquired from food.  Hepatitis B vaccine to protect against a form of infection of the liver by a virus acquired from blood or body fluids, particularly if you work in health care.  If you plan to travel internationally, check with your local health department for specific vaccination recommendations.  Cancer Screening:  Breast cancer screening is essential to preventive care for women. All women  age 91 and older should perform a breast self-exam every month. At age 95 and older, women should have their caregiver complete a breast exam each year. Women at ages 10 and older should have a mammogram (x-ray film) of the breasts. Your caregiver can discuss how often you need mammograms.    Cervical cancer screening includes taking a Pap smear (sample of cells examined under a microscope) from the cervix (end of the uterus). It also includes testing for HPV (Human Papilloma Virus, which can cause cervical cancer). Screening and a pelvic exam should begin at age 71, or 3 years after a woman becomes sexually active. Screening should occur every year, with a Pap smear but no HPV testing, up to age 35. After age 60, you should have a Pap smear every 3 years with HPV testing, if no HPV was found previously.   Most routine colon cancer screening begins at the age of 74. On a yearly basis, doctors may provide special easy to use take-home tests to check for hidden blood in the stool. Sigmoidoscopy or colonoscopy can detect the earliest forms of colon cancer and is life saving. These tests use a small camera at the end of a tube to directly examine the colon. Speak to your caregiver about this at age 56, when routine screening begins (and is repeated every 5 years unless early forms of pre-cancerous polyps or small growths are found).

## 2018-08-23 LAB — POCT GLYCOSYLATED HEMOGLOBIN (HGB A1C): Hemoglobin A1C: 8.6 % — AB (ref 4.0–5.6)

## 2018-09-04 ENCOUNTER — Other Ambulatory Visit: Payer: Self-pay

## 2018-09-04 DIAGNOSIS — E118 Type 2 diabetes mellitus with unspecified complications: Secondary | ICD-10-CM

## 2018-09-04 MED ORDER — LISINOPRIL 5 MG PO TABS: 5.0000 mg | ORAL_TABLET | Freq: Every day | ORAL | 1 refills | Status: DC

## 2018-09-05 DIAGNOSIS — R69 Illness, unspecified: Secondary | ICD-10-CM | POA: Diagnosis not present

## 2018-09-25 ENCOUNTER — Telehealth: Payer: Self-pay

## 2018-09-25 NOTE — Telephone Encounter (Signed)
Patient called and left message on voicemail asking for samples of farxiga 5 mg.  Per Audelia Acton ok to give samples.

## 2018-10-27 ENCOUNTER — Ambulatory Visit (INDEPENDENT_AMBULATORY_CARE_PROVIDER_SITE_OTHER): Payer: Medicare HMO | Admitting: Medical

## 2018-10-27 VITALS — BP 130/70 | HR 65 | Temp 98.1°F | Resp 16 | Ht 60.0 in | Wt 169.6 lb

## 2018-10-27 DIAGNOSIS — R350 Frequency of micturition: Secondary | ICD-10-CM

## 2018-10-27 DIAGNOSIS — R32 Unspecified urinary incontinence: Secondary | ICD-10-CM | POA: Diagnosis not present

## 2018-10-27 DIAGNOSIS — R300 Dysuria: Secondary | ICD-10-CM

## 2018-10-27 LAB — POCT URINALYSIS DIP (PROADVANTAGE DEVICE)
BILIRUBIN UA: NEGATIVE
BILIRUBIN UA: NEGATIVE mg/dL
LEUKOCYTES UA: NEGATIVE
Nitrite, UA: NEGATIVE
Protein Ur, POC: NEGATIVE mg/dL
SPECIFIC GRAVITY, URINE: 1.03
UUROB: NEGATIVE
pH, UA: 5.5 (ref 5.0–8.0)

## 2018-10-27 MED ORDER — TRAMADOL HCL 50 MG PO TABS: 50.0000 mg | ORAL_TABLET | Freq: Three times a day (TID) | ORAL | 0 refills | Status: DC | PRN

## 2018-10-27 MED ORDER — NITROFURANTOIN MONOHYD MACRO 100 MG PO CAPS: 100.0000 mg | ORAL_CAPSULE | Freq: Two times a day (BID) | ORAL | 0 refills | Status: DC

## 2018-10-27 NOTE — Progress Notes (Signed)
Subjective: Chief Complaint  Patient presents with  . bladder infection    bladder infection on and off    Here for a few days of burning with urination, possible urinary tract infection.  When she gets the symptoms she normally feels tired and sleepy which she has felt the last few days.  She denies fever, no blood, no nausea vomiting diarrhea, no vaginal discharge or vaginal irritation.  She continues to deal with some incontinence.  She does use adult diapers.  She has seen urology in the past but has not been back in a while.  Blood sugars have been running anywhere from 123 - 150 fasting.  She has been taking the [place] but ran out of it recently.  No other aggravating or relieving factors. No other complaint.  No other aggravating or relieving factors.  No other c/o.  Past Medical History:  Diagnosis Date  . Atrophic vaginitis    estrace cream rx'd by Dr. Karsten Ro  . Colon polyp    tubular adenoma (colonoscopy q5 yrs)  . Diabetes mellitus 2005  . Diverticulosis   . Dyslipidemia   . GAD (generalized anxiety disorder)   . Hemorrhoids   . OAB (overactive bladder)   . Osteoporosis   . Psoriasis    mild, sees [facility]  . Smoker     Current Outpatient Medications on File Prior to Visit  Medication Sig Dispense Refill  . alendronate (FOSAMAX) 70 MG tablet Take 1 tablet (70 mg total) by mouth every 7 (seven) days. Take with a full glass of water on an empty stomach. 12 tablet 3  . cholecalciferol (VITAMIN D) 1000 units tablet Take 1 tablet (1,000 Units total) by mouth daily. 90 tablet 3  . dapagliflozin propanediol (FARXIGA) 10 MG TABS tablet Take 10 mg by mouth daily. 90 tablet 3  . glucose blood (ONE TOUCH ULTRA TEST) test strip CHECK TWICE DAILY 200 each 3  . glucose blood (ONE TOUCH ULTRA TEST) test strip USE AS DIRECTED TO TEST BLOOD SUGARS ONCE A DAY 100 each 3  . Insulin Detemir (LEVEMIR) 100 UNIT/ML Pen Inject 30 Units into the skin daily. 15 mL 11  . Insulin  Pen Needle (BD PEN NEEDLE NANO U/F) 32G X 4 MM MISC 1 each by Does not apply route daily. 100 each 11  . Linaclotide (LINZESS PO) Take by mouth.    Marland Kitchen lisinopril (PRINIVIL,ZESTRIL) 5 MG tablet Take 1 tablet (5 mg total) by mouth daily. 90 tablet 1  . metFORMIN (GLUCOPHAGE XR) 500 MG 24 hr tablet Take 1 tablet (500 mg total) by mouth daily with breakfast. 90 tablet 3  . NEEDLE, DISP, 30 G (BD DISP NEEDLES) 30G X 1/2" MISC 1 each by Does not apply route at bedtime. 100 each 11  . rosuvastatin (CRESTOR) 20 MG tablet Take 1 tablet (20 mg total) by mouth daily. 90 tablet 3   No current facility-administered medications on file prior to visit.     ROS as in subjective  Reviewed allergies, medications, past medical, surgical, and social history.     Objective: BP 130/70   Pulse 65   Temp 98.1 F (36.7 C) (Oral)   Resp 16   Ht 5' (1.524 m)   Wt 169 lb 9.6 oz (76.9 kg)   SpO2 97%   BMI 33.12 kg/m   General appearance: alert, no distress, WD/WN, female Abdomen: +bs, soft, non tender, non distended, no masses, no hepatomegaly, no splenomegaly, no bruits Back: no CVA tenderness GU:  deferred      Assessment: Encounter Diagnoses  Name Primary?  . Dysuria Yes  . Urinary frequency   . Urinary incontinence, unspecified type      Plan: Discussed symptoms, diagnosis or urinary tract infection, possible complications, and usual course of illness.  Begin Macrobid, Ultram as needed for pain, hydrate well, follow-up pending culture.  Advise she go ahead and do a follow-up with urology regarding incontinence  Call or return if worse or not improving in the next 3-4 days.     Hannahmarie was seen today for bladder infection.  Diagnoses and all orders for this visit:  Dysuria -     POCT Urinalysis DIP (Proadvantage Device) -     Urine Culture  Urinary frequency -     Urine Culture  Urinary incontinence, unspecified type  Other orders -     traMADol (ULTRAM) 50 MG tablet; Take 1 tablet  (50 mg total) by mouth every 8 (eight) hours as needed. -     nitrofurantoin, macrocrystal-monohydrate, (MACROBID) 100 MG capsule; Take 1 capsule (100 mg total) by mouth 2 (two) times daily.

## 2018-10-28 LAB — URINE CULTURE

## 2018-11-29 DIAGNOSIS — N3946 Mixed incontinence: Secondary | ICD-10-CM | POA: Diagnosis not present

## 2018-11-29 DIAGNOSIS — R1084 Generalized abdominal pain: Secondary | ICD-10-CM | POA: Diagnosis not present

## 2018-12-01 ENCOUNTER — Telehealth: Payer: Self-pay

## 2018-12-01 ENCOUNTER — Other Ambulatory Visit: Payer: Self-pay | Admitting: Medical

## 2018-12-01 MED ORDER — TRAMADOL HCL 50 MG PO TABS: 50.0000 mg | ORAL_TABLET | Freq: Three times a day (TID) | ORAL | 0 refills | Status: DC | PRN

## 2018-12-01 NOTE — Telephone Encounter (Signed)
Unfortunately, I don't have the urology records or know what they were treating.   Any more details?

## 2018-12-01 NOTE — Telephone Encounter (Signed)
Medication was sent for short term pain control

## 2018-12-01 NOTE — Telephone Encounter (Signed)
Patient called and left message on my  voicemail wanting something for pain in her vaginal area.  She went to Urology this week and they put her on Trimethoprim.  Please advise

## 2018-12-01 NOTE — Telephone Encounter (Signed)
Patient states it hurts with she urinates and she has rectal pain also.  Urology cultured urine.  She didn't ask urology for pain meds .

## 2018-12-02 NOTE — Telephone Encounter (Signed)
Pt called and informed.

## 2018-12-19 ENCOUNTER — Encounter: Payer: Self-pay | Admitting: Medical

## 2018-12-19 ENCOUNTER — Ambulatory Visit (INDEPENDENT_AMBULATORY_CARE_PROVIDER_SITE_OTHER): Payer: Medicare HMO | Admitting: Medical

## 2018-12-19 VITALS — BP 130/74 | HR 68 | Temp 97.7°F | Resp 16 | Ht 62.0 in | Wt 168.0 lb

## 2018-12-19 DIAGNOSIS — E1169 Type 2 diabetes mellitus with other specified complication: Secondary | ICD-10-CM | POA: Diagnosis not present

## 2018-12-19 DIAGNOSIS — R0989 Other specified symptoms and signs involving the circulatory and respiratory systems: Secondary | ICD-10-CM | POA: Insufficient documentation

## 2018-12-19 DIAGNOSIS — R350 Frequency of micturition: Secondary | ICD-10-CM | POA: Diagnosis not present

## 2018-12-19 DIAGNOSIS — R300 Dysuria: Secondary | ICD-10-CM

## 2018-12-19 DIAGNOSIS — M810 Age-related osteoporosis without current pathological fracture: Secondary | ICD-10-CM | POA: Diagnosis not present

## 2018-12-19 DIAGNOSIS — R5381 Other malaise: Secondary | ICD-10-CM | POA: Insufficient documentation

## 2018-12-19 DIAGNOSIS — E559 Vitamin D deficiency, unspecified: Secondary | ICD-10-CM | POA: Diagnosis not present

## 2018-12-19 DIAGNOSIS — Z794 Long term (current) use of insulin: Secondary | ICD-10-CM | POA: Diagnosis not present

## 2018-12-19 DIAGNOSIS — E785 Hyperlipidemia, unspecified: Secondary | ICD-10-CM | POA: Diagnosis not present

## 2018-12-19 DIAGNOSIS — N3281 Overactive bladder: Secondary | ICD-10-CM

## 2018-12-19 LAB — POCT URINALYSIS DIP (PROADVANTAGE DEVICE)
Bilirubin, UA: NEGATIVE
Glucose, UA: >=1000 mg/dL — AB
Ketones, POC UA: NEGATIVE mg/dL
Leukocytes, UA: NEGATIVE
Nitrite, UA: NEGATIVE
PROTEIN UA: NEGATIVE mg/dL
Specific Gravity, Urine: 1.02
Urobilinogen, Ur: NEGATIVE
pH, UA: 5.5 (ref 5.0–8.0)

## 2018-12-19 LAB — POCT GLYCOSYLATED HEMOGLOBIN (HGB A1C): Hemoglobin A1C: 7.2 % — AB (ref 4.0–5.6)

## 2018-12-19 NOTE — Progress Notes (Signed)
Subjective:     Patient ID: Marissa Oliver, female   DOB: [birth year], 77 y.o.   MRN: [id number]  HPI Chief Complaint  Patient presents with  . DM check    DM check, urine frequency   Here for diabetes check but not feeling well.     In the past if has infection or illness brewing, gets sleepy.    Feels that way now.   Has pain shooting up left lateral lower leg.   Has throbbing pain in left face as well.   Still dealing with urinary changes.   Saw urology recently, had eval, but was put on Trimethoprim x 6 weeks.   Has f/u on 01/11/19.  Having some urinary frequency.  No blood in urine.   No fever.   Has mild left lower abdomen pressure, but no back pain.    Glucose running 130-160s fasting.  Just got 3 mo supply of Farxiga, whereas insurance wouldn't cover initially in 11/2018.  Insurance currently won't cover linzess.  Currently using OTC miralax.   Compliant with all other medications.    No chest pain, no SOB, no edema.    No other aggravating or relieving factors. No other complaint.  Past Medical History:  Diagnosis Date  . Atrophic vaginitis    estrace cream rx'd by Dr. Karsten Ro  . Colon polyp    tubular adenoma (colonoscopy q5 yrs)  . Diabetes mellitus 2005  . Diverticulosis   . Dyslipidemia   . GAD (generalized anxiety disorder)   . Hemorrhoids   . OAB (overactive bladder)   . Osteoporosis   . Psoriasis    mild, sees [facility]  . Smoker    Current Outpatient Medications on File Prior to Visit  Medication Sig Dispense Refill  . alendronate (FOSAMAX) 70 MG tablet Take 1 tablet (70 mg total) by mouth every 7 (seven) days. Take with a full glass of water on an empty stomach. 12 tablet 3  . cholecalciferol (VITAMIN D) 1000 units tablet Take 1 tablet (1,000 Units total) by mouth daily. 90 tablet 3  . dapagliflozin propanediol (FARXIGA) 10 MG TABS tablet Take 10 mg by mouth daily. 90 tablet 3  . glucose blood (ONE TOUCH ULTRA TEST) test strip CHECK TWICE DAILY 200  each 3  . glucose blood (ONE TOUCH ULTRA TEST) test strip USE AS DIRECTED TO TEST BLOOD SUGARS ONCE A DAY 100 each 3  . Insulin Detemir (LEVEMIR) 100 UNIT/ML Pen Inject 30 Units into the skin daily. 15 mL 11  . Insulin Pen Needle (BD PEN NEEDLE NANO U/F) 32G X 4 MM MISC 1 each by Does not apply route daily. 100 each 11  . Linaclotide (LINZESS PO) Take by mouth.    Marland Kitchen lisinopril (PRINIVIL,ZESTRIL) 5 MG tablet Take 1 tablet (5 mg total) by mouth daily. 90 tablet 1  . metFORMIN (GLUCOPHAGE XR) 500 MG 24 hr tablet Take 1 tablet (500 mg total) by mouth daily with breakfast. 90 tablet 3  . NEEDLE, DISP, 30 G (BD DISP NEEDLES) 30G X 1/2" MISC 1 each by Does not apply route at bedtime. 100 each 11  . nitrofurantoin, macrocrystal-monohydrate, (MACROBID) 100 MG capsule Take 1 capsule (100 mg total) by mouth 2 (two) times daily. 14 capsule 0  . rosuvastatin (CRESTOR) 20 MG tablet Take 1 tablet (20 mg total) by mouth daily. 90 tablet 3  . traMADol (ULTRAM) 50 MG tablet Take 1 tablet (50 mg total) by mouth every 8 (eight) hours as needed. 15  tablet 0  . trimethoprim (TRIMPEX) 100 MG tablet Take 100 mg by mouth daily.     No current facility-administered medications on file prior to visit.     Review of Systems As in subjective     Objective:   Physical Exam BP 130/74   Pulse 68   Temp 97.7 F (36.5 C) (Oral)   Resp 16   Ht 5\' 2"  (1.575 m)   Wt 168 lb (76.2 kg)   SpO2 98%   BMI 30.73 kg/m   General appearance: alert, no distress, WD/WN,  Neck: supple, no lymphadenopathy, no thyromegaly, no masses Heart: RRR, normal S1, S2, no murmurs Lungs: CTA bilaterally, no wheezes, rhonchi, or rales Abdomen: +bs, soft, non tender, non distended, no masses, no hepatomegaly, no splenomegaly Pulses: 2+ symmetric, upper and lower extremities, normal cap refill     Diabetic Foot Exam - Simple   Simple Foot Form Diabetic Foot exam was performed with the following findings:  Yes 12/19/2018  3:20 PM  Visual  Inspection No deformities, no ulcerations, no other skin breakdown bilaterally:  Yes Sensation Testing Intact to touch and monofilament testing bilaterally:  Yes Pulse Check See comments:  Yes Comments Decreased pedal pulses, decreased capillary refill bilaterally        Assessment:     Encounter Diagnoses  Name Primary?  . Urine frequency Yes  . Type 2 diabetes mellitus with other specified complication, with long-term current use of insulin ([place])   . Overactive bladder   . Dyslipidemia   . Dysuria   . Vitamin D deficiency   . Malaise   . Osteoporosis, unspecified osteoporosis type, unspecified pathological fracture presence   . Decreased pulses in feet        Plan:     We discussed her current symptoms, concerns, discussed her chronic illnesses, routine labs today.  Counseled on fasting glucose monitoring, exercise, healthy diet, compliance with medications  Continue antibiotics given by urology recently for urinary tract infection, and continue urology recommendations for overactive bladder  Osteoporosis-continue Fosamax  Hyperlipidemia-continue statin  Marissa Oliver was seen today for dm check.  Diagnoses and all orders for this visit:  Urine frequency -     POCT Urinalysis DIP (Proadvantage Device) -     CBC  Type 2 diabetes mellitus with other specified complication, with long-term current use of insulin (H[facility]) -     Comprehensive metabolic panel -     CBC -     Lipid panel -     Cancel: Hemoglobin A1c -     Microalbumin / creatinine urine ratio -     HM DIABETES EYE EXAM -     HM DIABETES FOOT EXAM -     TSH -     POCT glycosylated hemoglobin (Hb A1C)  Overactive bladder  Dyslipidemia -     Lipid panel  Dysuria  Vitamin D deficiency  Malaise -     Comprehensive metabolic panel -     CBC -     TSH  Osteoporosis, unspecified osteoporosis type, unspecified pathological fracture presence  Decreased pulses in feet -     VAS [place] ABI WITH/WO TBI;  Future

## 2018-12-20 ENCOUNTER — Other Ambulatory Visit: Payer: Self-pay | Admitting: Medical

## 2018-12-20 DIAGNOSIS — M810 Age-related osteoporosis without current pathological fracture: Secondary | ICD-10-CM

## 2018-12-20 DIAGNOSIS — E1169 Type 2 diabetes mellitus with other specified complication: Secondary | ICD-10-CM

## 2018-12-20 DIAGNOSIS — Z794 Long term (current) use of insulin: Secondary | ICD-10-CM

## 2018-12-20 LAB — CBC
Hematocrit: 40.9 % (ref 34.0–46.6)
Hemoglobin: 14.1 g/dL (ref 11.1–15.9)
MCH: 29.2 pg (ref 26.6–33.0)
MCHC: 34.5 g/dL (ref 31.5–35.7)
MCV: 85 fL (ref 79–97)
PLATELETS: 185 10*3/uL (ref 150–450)
RBC: 4.83 x10E6/uL (ref 3.77–5.28)
RDW: 13.4 % (ref 11.7–15.4)
WBC: 6.3 10*3/uL (ref 3.4–10.8)

## 2018-12-20 LAB — COMPREHENSIVE METABOLIC PANEL
A/G RATIO: 2.2 (ref 1.2–2.2)
ALT: 24 IU/L (ref 0–32)
AST: 18 IU/L (ref 0–40)
Albumin: 4.9 g/dL — ABNORMAL HIGH (ref 3.7–4.7)
Alkaline Phosphatase: 89 IU/L (ref 39–117)
BUN/Creatinine Ratio: 26 (ref 12–28)
BUN: 19 mg/dL (ref 8–27)
Bilirubin Total: 0.3 mg/dL (ref 0.0–1.2)
CO2: 18 mmol/L — ABNORMAL LOW (ref 20–29)
Calcium: 10 mg/dL (ref 8.7–10.3)
Chloride: 101 mmol/L (ref 96–106)
Creatinine, Ser: 0.74 mg/dL (ref 0.57–1.00)
GFR calc Af Amer: 89 mL/min/{1.73_m2} (ref >59)
GFR calc non Af Amer: 77 mL/min/{1.73_m2} (ref >59)
GLOBULIN, TOTAL: 2.2 g/dL (ref 1.5–4.5)
Glucose: 127 mg/dL — ABNORMAL HIGH (ref 65–99)
Potassium: 4.3 mmol/L (ref 3.5–5.2)
SODIUM: 138 mmol/L (ref 134–144)
Total Protein: 7.1 g/dL (ref 6.0–8.5)

## 2018-12-20 LAB — LIPID PANEL
Chol/HDL Ratio: 2.6 ratio (ref 0.0–4.4)
Cholesterol, Total: 148 mg/dL (ref 100–199)
HDL: 56 mg/dL (ref >39)
LDL Calculated: 60 mg/dL (ref 0–99)
Triglycerides: 160 mg/dL — ABNORMAL HIGH (ref 0–149)
VLDL Cholesterol Cal: 32 mg/dL (ref 5–40)

## 2018-12-20 LAB — MICROALBUMIN / CREATININE URINE RATIO
Creatinine, Urine: 68.6 mg/dL
Microalb/Creat Ratio: 7 mg/g creat (ref 0–29)
Microalbumin, Urine: 4.9 ug/mL

## 2018-12-20 LAB — TSH: TSH: 1.93 u[IU]/mL (ref 0.450–4.500)

## 2018-12-20 MED ORDER — ALENDRONATE SODIUM 70 MG PO TABS: 70.0000 mg | ORAL_TABLET | ORAL | 3 refills | Status: DC

## 2018-12-20 MED ORDER — INSULIN DETEMIR 100 UNIT/ML FLEXPEN: 35.0000 [IU] | PEN_INJECTOR | Freq: Every day | SUBCUTANEOUS | 2 refills | Status: DC

## 2018-12-22 ENCOUNTER — Other Ambulatory Visit: Payer: Self-pay | Admitting: Medical

## 2018-12-22 DIAGNOSIS — R69 Illness, unspecified: Secondary | ICD-10-CM | POA: Diagnosis not present

## 2018-12-22 NOTE — Telephone Encounter (Signed)
Is this ok to refill?  

## 2018-12-25 ENCOUNTER — Other Ambulatory Visit: Payer: Self-pay

## 2018-12-25 DIAGNOSIS — Z794 Long term (current) use of insulin: Secondary | ICD-10-CM

## 2018-12-25 DIAGNOSIS — R0989 Other specified symptoms and signs involving the circulatory and respiratory systems: Secondary | ICD-10-CM

## 2018-12-25 DIAGNOSIS — E1169 Type 2 diabetes mellitus with other specified complication: Secondary | ICD-10-CM

## 2018-12-26 ENCOUNTER — Other Ambulatory Visit: Payer: Self-pay

## 2018-12-26 DIAGNOSIS — E1169 Type 2 diabetes mellitus with other specified complication: Secondary | ICD-10-CM

## 2018-12-26 DIAGNOSIS — Z794 Long term (current) use of insulin: Secondary | ICD-10-CM

## 2018-12-26 DIAGNOSIS — R0989 Other specified symptoms and signs involving the circulatory and respiratory systems: Secondary | ICD-10-CM

## 2018-12-27 DIAGNOSIS — Z1231 Encounter for screening mammogram for malignant neoplasm of breast: Secondary | ICD-10-CM | POA: Diagnosis not present

## 2018-12-27 LAB — HM MAMMOGRAPHY

## 2018-12-29 ENCOUNTER — Telehealth: Payer: Self-pay | Admitting: Medical

## 2018-12-29 NOTE — Telephone Encounter (Signed)
Patient notified of mammogram results.

## 2018-12-29 NOTE — Telephone Encounter (Signed)
I am happy to report that her mammogram was normal, no worrisome findings.

## 2019-01-02 DIAGNOSIS — H524 Presbyopia: Secondary | ICD-10-CM | POA: Diagnosis not present

## 2019-01-02 DIAGNOSIS — E119 Type 2 diabetes mellitus without complications: Secondary | ICD-10-CM | POA: Diagnosis not present

## 2019-01-02 DIAGNOSIS — Z961 Presence of intraocular lens: Secondary | ICD-10-CM | POA: Diagnosis not present

## 2019-01-02 LAB — HM DIABETES EYE EXAM

## 2019-01-03 ENCOUNTER — Ambulatory Visit (HOSPITAL_COMMUNITY)
Admission: RE | Admit: 2019-01-03 | Discharge: 2019-01-03 | Disposition: A | Discharge status: Home / Self Care | Payer: Medicare HMO | Source: Ambulatory Visit | Attending: Vascular Surgery | Admitting: Vascular Surgery

## 2019-01-03 ENCOUNTER — Encounter: Payer: Self-pay | Admitting: Medical

## 2019-01-03 DIAGNOSIS — Z794 Long term (current) use of insulin: Secondary | ICD-10-CM | POA: Insufficient documentation

## 2019-01-03 DIAGNOSIS — R0989 Other specified symptoms and signs involving the circulatory and respiratory systems: Secondary | ICD-10-CM

## 2019-01-03 DIAGNOSIS — E1169 Type 2 diabetes mellitus with other specified complication: Secondary | ICD-10-CM | POA: Insufficient documentation

## 2019-03-13 ENCOUNTER — Ambulatory Visit (INDEPENDENT_AMBULATORY_CARE_PROVIDER_SITE_OTHER): Payer: Medicare HMO | Admitting: Medical

## 2019-03-13 ENCOUNTER — Other Ambulatory Visit: Payer: Self-pay

## 2019-03-13 DIAGNOSIS — K5900 Constipation, unspecified: Secondary | ICD-10-CM

## 2019-03-13 DIAGNOSIS — E785 Hyperlipidemia, unspecified: Secondary | ICD-10-CM | POA: Diagnosis not present

## 2019-03-13 DIAGNOSIS — E1169 Type 2 diabetes mellitus with other specified complication: Secondary | ICD-10-CM | POA: Diagnosis not present

## 2019-03-13 DIAGNOSIS — Z794 Long term (current) use of insulin: Secondary | ICD-10-CM

## 2019-03-13 NOTE — Progress Notes (Signed)
  Subjective:     Patient ID: Marissa Oliver, female   DOB: 09-01-1941, 78 y.o.   MRN: [id number]  This visit type was conducted due to national recommendations for restrictions regarding the COVID-19 Pandemic (e.g. social distancing) in an effort to limit this patient's exposure and mitigate transmission in our community.  Due to their co-morbid illnesses, this patient is at least at moderate risk for complications without adequate follow up.  This format is felt to be most appropriate for this patient at this time.    Documentation for virtual audio and video telecommunications through Zoom encounter:  The patient was located at home. The provider was located in the office. The patient did consent to this visit and is aware of possible charges through their insurance for this visit.  The other persons participating in this telemedicine service were none. Time spent on call was 20 minutes and in review of previous records >20 minutes total.  This virtual service is not related to other E/M service within previous 7 days.   HPI Chief Complaint  Patient presents with  . DM    virtual dm check  sugars running 145    Virtual visit today for med check.  Diabetes -compliant with Farxiga 10 mg daily, Levemir 35 units daily.  Not taking metformin any more, doesn't like the way it makes her feel.   Glucose numbers running on average 145. No polyuria, no polydipsa, no weight changes, no vision changes.    Renal protection-compliant with lisinopril 5 mg daily  Hyperlipidemia-compliant with Crestor 20 mg daily  Constipation - uses Linzess occasionally with good benefit.  Doesn't take it daily.  Concern is cost of medication.  All her name brand, can get costly.   May need help with samples.  Review of Systems As in subjective    Objective:   Physical Exam  Due to coronavirus pandemic stay at home measures, patient visit was virtual and they were not examined in person.   Gen: nad      Assessment:     Encounter Diagnoses  Name Primary?  . Type 2 diabetes mellitus with other specified complication, with long-term current use of insulin ([place]) Yes  . Dyslipidemia   . Constipation, unspecified constipation type        Plan:     Diabetes-she stopped taking metformin but continues on Farxiga and Levemir daily.  She plans to come by tomorrow for curbside hemoglobin A1c to minimize exposure to others.  Advise she wear a mask and call our office when she gets in the parking lot.  Continue glucose monitoring, continue with current medications, counseled on diet and exercise  Dyslipidemia-compliant with statin, recent labs in February were at goal  Constipation-doing fine on Linzess as needed  We discussed her barriers including finances and medication cost.  I have asked her to call periodically to get samples of the medicines that she needs samples on.  We discussed possibly changing to the syringe and vial instead of the pen device for Levemir.  She will let me know if she wants to do this.  [name] was seen today for dm.  Diagnoses and all orders for this visit:  Type 2 diabetes mellitus with other specified complication, with long-term current use of insulin (H[facility])  Dyslipidemia  Constipation, unspecified constipation type

## 2019-03-14 ENCOUNTER — Other Ambulatory Visit: Payer: Self-pay | Admitting: Medical

## 2019-03-14 ENCOUNTER — Other Ambulatory Visit (INDEPENDENT_AMBULATORY_CARE_PROVIDER_SITE_OTHER): Payer: Medicare HMO

## 2019-03-14 DIAGNOSIS — E1169 Type 2 diabetes mellitus with other specified complication: Secondary | ICD-10-CM

## 2019-03-14 DIAGNOSIS — Z794 Long term (current) use of insulin: Secondary | ICD-10-CM

## 2019-03-14 LAB — POCT GLYCOSYLATED HEMOGLOBIN (HGB A1C): Hemoglobin A1C: 7.6 % — AB (ref 4.0–5.6)

## 2019-03-14 MED ORDER — INSULIN DETEMIR 100 UNIT/ML FLEXPEN: 38.0000 [IU] | PEN_INJECTOR | Freq: Every day | SUBCUTANEOUS | 0 refills | Status: DC

## 2019-03-15 DIAGNOSIS — L400 Psoriasis vulgaris: Secondary | ICD-10-CM | POA: Diagnosis not present

## 2019-03-21 DIAGNOSIS — R69 Illness, unspecified: Secondary | ICD-10-CM | POA: Diagnosis not present

## 2019-03-23 ENCOUNTER — Telehealth: Payer: Self-pay | Admitting: Medical

## 2019-03-23 NOTE — Telephone Encounter (Signed)
I don't have levemir, but she can use a [place] sample which we have.  Can she pick up Tuesday.  Tyler Aas is the same type of medicatin, same dose as her levemir.

## 2019-03-23 NOTE — Telephone Encounter (Signed)
Left message on voicemail for patient to call back and pick up tresiba samples.

## 2019-03-27 NOTE — Telephone Encounter (Signed)
Patient to come by Tuesday to pick up the [place].

## 2019-04-16 ENCOUNTER — Telehealth: Payer: Self-pay | Admitting: Medical

## 2019-04-16 NOTE — Telephone Encounter (Signed)
Marissa Oliver  Can you see if we have any samples.  Marissa Oliver

## 2019-04-18 NOTE — Telephone Encounter (Signed)
Pt given samples Farxiga 10mg  #6 & Levemir Flexpen #2 per Audelia Acton.  Pt informed

## 2019-04-20 ENCOUNTER — Telehealth: Payer: Self-pay | Admitting: Medical

## 2019-04-20 NOTE — Telephone Encounter (Signed)
Patient states that she already picked up meds on Tuesday.

## 2019-04-20 NOTE — Telephone Encounter (Signed)
Please see if we have any samples of Levemir or Farxiga for Ms. Wynonia Lawman

## 2019-05-22 ENCOUNTER — Telehealth: Payer: Self-pay | Admitting: Medical

## 2019-05-22 NOTE — Telephone Encounter (Signed)
Patient notified to come pick up samples.

## 2019-05-22 NOTE — Telephone Encounter (Signed)
Please get her some Marissa Oliver samples (2) which is same as Levemir (same dose), and give the linzess and farxiga samples I placed at your desk

## 2019-05-30 ENCOUNTER — Other Ambulatory Visit: Payer: Self-pay | Admitting: Medical

## 2019-05-30 DIAGNOSIS — E118 Type 2 diabetes mellitus with unspecified complications: Secondary | ICD-10-CM

## 2019-06-03 DIAGNOSIS — R69 Illness, unspecified: Secondary | ICD-10-CM | POA: Diagnosis not present

## 2019-06-04 ENCOUNTER — Telehealth: Payer: Self-pay | Admitting: Medical

## 2019-06-04 NOTE — Telephone Encounter (Signed)
done

## 2019-06-04 NOTE — Telephone Encounter (Signed)
See her e-mail about flu shot so you can update in her immunizations.

## 2019-06-18 ENCOUNTER — Telehealth: Payer: Self-pay | Admitting: Medical

## 2019-06-18 NOTE — Telephone Encounter (Signed)
Please see her email.   I felt like we just gave some samples?

## 2019-06-19 MED ORDER — TRESIBA FLEXTOUCH 100 UNIT/ML ~~LOC~~ SOPN: 38.0000 [IU] | PEN_INJECTOR | Freq: Every day | SUBCUTANEOUS | 0 refills | Status: DC

## 2019-06-19 MED ORDER — LINACLOTIDE 145 MCG PO CAPS: 145.0000 ug | ORAL_CAPSULE | Freq: Every day | ORAL | 0 refills | Status: DC

## 2019-06-19 NOTE — Telephone Encounter (Signed)
Shane, No I haven't given her samples in a long time.  Also Levemir is no longer sampled, we have [place] and some others,  Can pt be switched to something else that we have samples of?  Next, I called pt and she doesn't know which dosage of the Linzess you gave her before thinks it was 145 mcg but said it didn't always work, we have samples of 230mcg, is this ok to give her samples?  I do have samples [place]

## 2019-06-19 NOTE — Telephone Encounter (Signed)
Ok samples, [place] same dosage as Levemir, Farxiga and Linzess 145 per [place].  Called rep for samples Linzess.  Called pt and informed

## 2019-06-22 ENCOUNTER — Other Ambulatory Visit: Payer: Self-pay | Admitting: Medical

## 2019-06-22 DIAGNOSIS — Z794 Long term (current) use of insulin: Secondary | ICD-10-CM

## 2019-06-22 DIAGNOSIS — E1169 Type 2 diabetes mellitus with other specified complication: Secondary | ICD-10-CM

## 2019-07-05 ENCOUNTER — Telehealth: Payer: Self-pay

## 2019-07-05 MED ORDER — FARXIGA 10 MG PO TABS: 10.0000 mg | ORAL_TABLET | Freq: Every day | ORAL | 0 refills | Status: DC

## 2019-07-05 MED ORDER — TRESIBA FLEXTOUCH 100 UNIT/ML ~~LOC~~ SOPN: 38.0000 [IU] | PEN_INJECTOR | Freq: Every day | SUBCUTANEOUS | 0 refills | Status: DC

## 2019-07-05 NOTE — Telephone Encounter (Signed)
Patient called and requested samples of Linzess, [place] and Tresiba. Pt was informed we are still waiting on Linzess for her but we will call her when it comes in. Pt will pick up samples we have in the office tomorrow.

## 2019-07-10 ENCOUNTER — Other Ambulatory Visit: Payer: Self-pay | Admitting: Medical

## 2019-07-10 ENCOUNTER — Telehealth: Payer: Self-pay

## 2019-07-10 MED ORDER — LINACLOTIDE 145 MCG PO CAPS: 145.0000 ug | ORAL_CAPSULE | Freq: Every day | ORAL | 0 refills | Status: DC

## 2019-07-10 NOTE — Telephone Encounter (Signed)
Pt called and stated she checked with pharmacy and they told her she has been taking Linzess 290. At this time we are unsure which dosage she should be taking as there does not seem to be any documentation. Do you want patient to take 290, 145 or 72? Please advise so that we an provide patient with samples.

## 2019-07-10 NOTE — Telephone Encounter (Signed)
Linzess 145 mcg.  I sent a 90-day supply to your pharmacy to check insurance coverage.

## 2019-07-11 NOTE — Telephone Encounter (Signed)
Samples #24 given to pt.  [place] cost is $600, she will not be able to afford that.  No P.A. required, this is brand medication so this is why it's so expensive.  Also no discount card available because pt has [employer].  Left message for pt

## 2019-08-13 ENCOUNTER — Telehealth: Payer: Self-pay

## 2019-08-13 MED ORDER — FARXIGA 10 MG PO TABS: 10.0000 mg | ORAL_TABLET | Freq: Every day | ORAL | 0 refills | Status: DC

## 2019-08-13 MED ORDER — TRESIBA FLEXTOUCH 100 UNIT/ML ~~LOC~~ SOPN: 38.0000 [IU] | PEN_INJECTOR | Freq: Every day | SUBCUTANEOUS | 0 refills | Status: DC

## 2019-08-13 NOTE — Telephone Encounter (Signed)
Patient has requested samples of farxiga and [place].

## 2019-08-13 NOTE — Telephone Encounter (Signed)
Patient has been informed that samples are ready and she will pick them up tomorrow.

## 2019-09-10 ENCOUNTER — Ambulatory Visit: Payer: Medicare HMO | Admitting: Medical

## 2019-09-24 ENCOUNTER — Other Ambulatory Visit: Payer: Self-pay

## 2019-09-24 ENCOUNTER — Telehealth: Payer: Self-pay | Admitting: Medical

## 2019-09-24 ENCOUNTER — Ambulatory Visit: Payer: Medicare HMO | Admitting: Medical

## 2019-09-24 DIAGNOSIS — Z20822 Contact with and (suspected) exposure to covid-19: Secondary | ICD-10-CM

## 2019-09-24 MED ORDER — "BD DISP NEEDLES 30G X 1/2"" MISC": 1.0000 | Freq: Every day | 11 refills | Status: DC

## 2019-09-24 MED ORDER — ROSUVASTATIN CALCIUM 20 MG PO TABS: 20.0000 mg | ORAL_TABLET | Freq: Every day | ORAL | 3 refills | Status: DC

## 2019-09-24 NOTE — Telephone Encounter (Signed)
Pt called and is requesting a refill on crestor, lisinopril, and pens for her insulin pt had to cancel appt for today she went for a COVID test and she was having symptoms please send to  [facility] [place], [place]

## 2019-09-24 NOTE — Telephone Encounter (Signed)
Done

## 2019-09-25 ENCOUNTER — Other Ambulatory Visit: Payer: Self-pay | Admitting: Medical

## 2019-09-26 ENCOUNTER — Other Ambulatory Visit: Payer: Self-pay | Admitting: Medical

## 2019-09-26 ENCOUNTER — Telehealth: Payer: Self-pay | Admitting: Medical

## 2019-09-26 LAB — NOVEL CORONAVIRUS, NAA: SARS-CoV-2, NAA: NOT DETECTED

## 2019-09-26 MED ORDER — PROMETHAZINE-DM 6.25-15 MG/5ML PO SYRP: 5.0000 mL | ORAL_SOLUTION | Freq: Four times a day (QID) | ORAL | 0 refills | Status: DC | PRN

## 2019-09-26 NOTE — Telephone Encounter (Signed)
Pt called and stated that she tested negative for Covid but she is still coughing pretty bad. Pt declined a virtual visit but she just wanted to know if you could call her in something.

## 2019-09-26 NOTE — Telephone Encounter (Signed)
Pt advised.

## 2019-09-26 NOTE — Telephone Encounter (Signed)
I sent in a cough medication

## 2019-10-02 ENCOUNTER — Telehealth: Payer: Self-pay | Admitting: Medical

## 2019-10-02 NOTE — Telephone Encounter (Signed)
Pt called and said she has been taking the cough medicine you prescribed but she is still coughing. She said she was tested for Covid and it was Negative. She is not sure if she needs to try something else or schedule a visit with you. She said it has gotten a little better but still coughing

## 2019-10-02 NOTE — Telephone Encounter (Signed)
Often with a cold or respiratory infection, cough can linger for a few weeks.   If having a hard time with symptoms, offer virtual consult

## 2019-10-03 ENCOUNTER — Other Ambulatory Visit: Payer: Self-pay | Admitting: Medical

## 2019-10-03 MED ORDER — PROMETHAZINE-DM 6.25-15 MG/5ML PO SYRP: 5.0000 mL | ORAL_SOLUTION | Freq: Four times a day (QID) | ORAL | 0 refills | Status: DC | PRN

## 2019-10-03 NOTE — Telephone Encounter (Signed)
Pt stated that today she feels better and declined a visit. She is almost out of cough medicine and wants to see if she can get another refill. Pt scheduled for Diabetes check on 12/10 and advised to reschedule if she is not feeling well.

## 2019-10-05 ENCOUNTER — Other Ambulatory Visit: Payer: Self-pay | Admitting: Medical

## 2019-10-05 MED ORDER — AMOXICILLIN 875 MG PO TABS: 875.0000 mg | ORAL_TABLET | Freq: Two times a day (BID) | ORAL | 0 refills | Status: DC

## 2019-10-11 ENCOUNTER — Other Ambulatory Visit: Payer: Self-pay

## 2019-10-11 ENCOUNTER — Telehealth: Payer: Self-pay

## 2019-10-11 ENCOUNTER — Encounter: Payer: Self-pay | Admitting: Medical

## 2019-10-11 ENCOUNTER — Ambulatory Visit (INDEPENDENT_AMBULATORY_CARE_PROVIDER_SITE_OTHER): Payer: Medicare HMO | Admitting: Medical

## 2019-10-11 VITALS — BP 130/80 | HR 64 | Temp 97.7°F | Wt 171.8 lb

## 2019-10-11 DIAGNOSIS — N3281 Overactive bladder: Secondary | ICD-10-CM

## 2019-10-11 DIAGNOSIS — E785 Hyperlipidemia, unspecified: Secondary | ICD-10-CM

## 2019-10-11 DIAGNOSIS — E1169 Type 2 diabetes mellitus with other specified complication: Secondary | ICD-10-CM | POA: Diagnosis not present

## 2019-10-11 DIAGNOSIS — K5900 Constipation, unspecified: Secondary | ICD-10-CM

## 2019-10-11 DIAGNOSIS — Z794 Long term (current) use of insulin: Secondary | ICD-10-CM | POA: Diagnosis not present

## 2019-10-11 DIAGNOSIS — M810 Age-related osteoporosis without current pathological fracture: Secondary | ICD-10-CM

## 2019-10-11 DIAGNOSIS — R32 Unspecified urinary incontinence: Secondary | ICD-10-CM | POA: Diagnosis not present

## 2019-10-11 LAB — POCT GLYCOSYLATED HEMOGLOBIN (HGB A1C): HbA1c, POC (controlled diabetic range): 7.7 % — AB (ref 0.0–7.0)

## 2019-10-11 NOTE — Telephone Encounter (Signed)
Pt. Called stating that she picked up her insulin and it was for 35 units and you have just increased today to 38 units, pt. Will take the 38 units as you told her but it may run out a little earlier if next time it could be sent in as 38 units.

## 2019-10-11 NOTE — Progress Notes (Signed)
Subjective: Chief Complaint  Patient presents with  . Diabetes    diabetes check pt. would like samples of farxiga, bs was 134 this morning, pt. thinks she has been on fosamax for a long time, fett doing good no issues   Here for med check.  Diabetes - checking sugars, seeing 130s glucose fasting.  Taking Farxiga 10mg  daily.  Taking Tyler Aas 35u still, not 38 as discussed prior.  No low readings.   No foot concerns.   No polydipsia, no blurred vision.  Saw urology recently about bladder issues.  Compliant with crestor 20mg  without c/o  Compliant with Lisnipril 5mg  daily.   Been on Fosamax many years.   No falls  Did eat more carbs over thanksgiving recently. Loves potatoes and macaroni.  Uses Linzess periodically for conspation.     Past Medical History:  Diagnosis Date  . Atrophic vaginitis    estrace cream rx'd by Dr. Karsten Ro  . Colon polyp    tubular adenoma (colonoscopy q5 yrs)  . Diabetes mellitus 2005  . Diverticulosis   . Dyslipidemia   . GAD (generalized anxiety disorder)   . Hemorrhoids   . OAB (overactive bladder)   . Osteoporosis   . Psoriasis    mild, sees [facility]  . Smoker    Current Outpatient Medications on File Prior to Visit  Medication Sig Dispense Refill  . alendronate (FOSAMAX) 70 MG tablet Take 1 tablet (70 mg total) by mouth every 7 (seven) days. Take with a full glass of water on an empty stomach. 12 tablet 3  . B-D UF III MINI PEN NEEDLES 31G X 5 MM MISC USE 1 DAILY 100 each 1  . cholecalciferol (VITAMIN D) 25 MCG (1000 UT) tablet TAKE 1 TABLET BY MOUTH EVERY DAY 90 tablet 3  . dapagliflozin propanediol (FARXIGA) 10 MG TABS tablet Take 10 mg by mouth daily. 28 tablet 0  . insulin degludec (TRESIBA FLEXTOUCH) 100 UNIT/ML SOPN FlexTouch Pen Inject 0.38 mLs (38 Units total) into the skin at bedtime. 1 pen 0  . Insulin Pen Needle (BD PEN NEEDLE NANO U/F) 32G X 4 MM MISC 1 each by Does not apply route daily. 100 each 11  . lisinopril  (ZESTRIL) 5 MG tablet TAKE 1 TABLET BY MOUTH EVERY DAY 90 tablet 1  . ONETOUCH ULTRA test strip USE AS DIRECTED TO TEST BLOOD SUGARS TWICE A DAY 200 strip 3  . rosuvastatin (CRESTOR) 20 MG tablet Take 1 tablet (20 mg total) by mouth daily. 90 tablet 3  . amoxicillin (AMOXIL) 875 MG tablet Take 1 tablet (875 mg total) by mouth 2 (two) times daily. (Patient not taking: Reported on 10/11/2019) 20 tablet 0  . linaclotide (LINZESS) 145 MCG CAPS capsule Take 1 capsule (145 mcg total) by mouth daily before breakfast. (Patient not taking: Reported on 10/11/2019) 90 capsule 0  . promethazine-dextromethorphan (PROMETHAZINE-DM) 6.25-15 MG/5ML syrup Take 5 mLs by mouth 4 (four) times daily as needed for cough. (Patient not taking: Reported on 10/11/2019) 120 mL 0  . traMADol (ULTRAM) 50 MG tablet Take 1 tablet (50 mg total) by mouth every 8 (eight) hours as needed. (Patient not taking: Reported on 03/13/2019) 15 tablet 0   No current facility-administered medications on file prior to visit.   ROS as in subjective   Objective: BP 130/80   Pulse 64   Temp 97.7 F (36.5 C)   Wt 171 lb 12.8 oz (77.9 kg)   BMI 31.42 kg/m   Wt Readings from Last  3 Encounters:  10/11/19 171 lb 12.8 oz (77.9 kg)  12/19/18 168 lb (76.2 kg)  10/27/18 169 lb 9.6 oz (76.9 kg)   Gen: wd, wn, nad Lungs somewhat decreased in general, but no wheezing, no rhonchi, no rales Heart rrr, normal s1, s2, no murmurs No LE edema Pulses 2+ UE, 1-2+ LE    Assessment: Encounter Diagnoses  Name Primary?  . Type 2 diabetes mellitus with other specified complication, with long-term current use of insulin ([place]) Yes  . Overactive bladder   . Dyslipidemia   . Urinary incontinence, unspecified type   . Constipation, unspecified constipation type   . Osteoporosis, unspecified osteoporosis type, unspecified pathological fracture presence      Plan: Diabetes - labs today, increase Tresiba to 38 u daily, c/t Farxiga 10mg   daily  Overactive bladder, incontinence - managed by urology  dyslipidemia - c/t statin  osteoporosis - stop fosamax for drug holiday as she has been on this > 5 years.    constipation - uses Linzess prn with good response.  Labs today as below   Sharnese was seen today for diabetes.  Diagnoses and all orders for this visit:  Type 2 diabetes mellitus with other specified complication, with long-term current use of insulin (HCC) -     Comprehensive metabolic panel -     Lipid Panel -     CBC with Differential -     HgB A1c -     Cancel: HgB A1c  Overactive bladder  Dyslipidemia -     Comprehensive metabolic panel -     Lipid Panel  Urinary incontinence, unspecified type  Constipation, unspecified constipation type -     CBC with Differential  Osteoporosis, unspecified osteoporosis type, unspecified pathological fracture presence  pending labs

## 2019-10-12 ENCOUNTER — Other Ambulatory Visit: Payer: Self-pay | Admitting: Medical

## 2019-10-12 ENCOUNTER — Telehealth: Payer: Self-pay | Admitting: Medical

## 2019-10-12 DIAGNOSIS — E118 Type 2 diabetes mellitus with unspecified complications: Secondary | ICD-10-CM

## 2019-10-12 LAB — CBC WITH DIFFERENTIAL/PLATELET
Basophils Absolute: 0 10*3/uL (ref 0.0–0.2)
Basos: 1 %
EOS (ABSOLUTE): 0.1 10*3/uL (ref 0.0–0.4)
Eos: 1 %
Hematocrit: 43.1 % (ref 34.0–46.6)
Hemoglobin: 14.6 g/dL (ref 11.1–15.9)
Immature Grans (Abs): 0 10*3/uL (ref 0.0–0.1)
Immature Granulocytes: 0 %
Lymphocytes Absolute: 1.7 10*3/uL (ref 0.7–3.1)
Lymphs: 29 %
MCH: 29.1 pg (ref 26.6–33.0)
MCHC: 33.9 g/dL (ref 31.5–35.7)
MCV: 86 fL (ref 79–97)
Monocytes Absolute: 0.4 10*3/uL (ref 0.1–0.9)
Monocytes: 6 %
Neutrophils Absolute: 3.8 10*3/uL (ref 1.4–7.0)
Neutrophils: 63 %
Platelets: 183 10*3/uL (ref 150–450)
RBC: 5.02 x10E6/uL (ref 3.77–5.28)
RDW: 13.3 % (ref 11.7–15.4)
WBC: 6.1 10*3/uL (ref 3.4–10.8)

## 2019-10-12 LAB — COMPREHENSIVE METABOLIC PANEL
ALT: 18 IU/L (ref 0–32)
AST: 14 IU/L (ref 0–40)
Albumin/Globulin Ratio: 2.2 (ref 1.2–2.2)
Albumin: 4.8 g/dL — ABNORMAL HIGH (ref 3.7–4.7)
Alkaline Phosphatase: 92 IU/L (ref 39–117)
BUN/Creatinine Ratio: 27 (ref 12–28)
BUN: 24 mg/dL (ref 8–27)
Bilirubin Total: 0.3 mg/dL (ref 0.0–1.2)
CO2: 17 mmol/L — ABNORMAL LOW (ref 20–29)
Calcium: 9.9 mg/dL (ref 8.7–10.3)
Chloride: 102 mmol/L (ref 96–106)
Creatinine, Ser: 0.88 mg/dL (ref 0.57–1.00)
GFR calc Af Amer: 72 mL/min/{1.73_m2} (ref >59)
GFR calc non Af Amer: 62 mL/min/{1.73_m2} (ref >59)
Globulin, Total: 2.2 g/dL (ref 1.5–4.5)
Glucose: 128 mg/dL — ABNORMAL HIGH (ref 65–99)
Potassium: 4.4 mmol/L (ref 3.5–5.2)
Sodium: 137 mmol/L (ref 134–144)
Total Protein: 7 g/dL (ref 6.0–8.5)

## 2019-10-12 LAB — LIPID PANEL
Chol/HDL Ratio: 2.7 ratio (ref 0.0–4.4)
Cholesterol, Total: 136 mg/dL (ref 100–199)
HDL: 50 mg/dL (ref >39)
LDL Chol Calc (NIH): 64 mg/dL (ref 0–99)
Triglycerides: 125 mg/dL (ref 0–149)
VLDL Cholesterol Cal: 22 mg/dL (ref 5–40)

## 2019-10-12 MED ORDER — FARXIGA 10 MG PO TABS: 10.0000 mg | ORAL_TABLET | Freq: Every day | ORAL | 3 refills | Status: DC

## 2019-10-12 MED ORDER — LEVEMIR FLEXTOUCH 100 UNIT/ML ~~LOC~~ SOPN: 38.0000 [IU] | PEN_INJECTOR | Freq: Every day | SUBCUTANEOUS | 2 refills | Status: DC

## 2019-10-12 MED ORDER — LISINOPRIL 5 MG PO TABS: 5.0000 mg | ORAL_TABLET | Freq: Every day | ORAL | 3 refills | Status: DC

## 2019-10-12 NOTE — Telephone Encounter (Signed)
Pt does not need refills on anything but she said she is taking LEVEMIR 38 units and not Taking [place]. You gave her a sample of tresiba because sample of levemir we did not have. She needs a new rx for Levemir for 38 units

## 2019-10-12 NOTE — Progress Notes (Signed)
Med sent.

## 2019-11-15 ENCOUNTER — Other Ambulatory Visit: Payer: Self-pay | Admitting: Medical

## 2019-11-24 DIAGNOSIS — R69 Illness, unspecified: Secondary | ICD-10-CM | POA: Diagnosis not present

## 2019-11-29 ENCOUNTER — Ambulatory Visit: Payer: Medicare HMO

## 2019-12-06 DIAGNOSIS — N3941 Urge incontinence: Secondary | ICD-10-CM | POA: Diagnosis not present

## 2019-12-08 ENCOUNTER — Ambulatory Visit: Payer: Medicare HMO | Attending: Internal Medicine

## 2019-12-08 DIAGNOSIS — Z23 Encounter for immunization: Secondary | ICD-10-CM

## 2019-12-08 NOTE — Progress Notes (Signed)
   Covid-19 Vaccination Clinic  Name:  Marissa Oliver    MRN: [id number] DOB: 01/25/1941  12/08/2019  [name] was observed post Covid-19 immunization for 15 minutes without incidence. She was provided with Vaccine Information Sheet and instruction to access the V-Safe system.   [name] was instructed to call 911 with any severe reactions post vaccine: Marland Kitchen Difficulty breathing  . Swelling of your face and throat  . A fast heartbeat  . A bad rash all over your body  . Dizziness and weakness    Immunizations Administered    Name Date Dose VIS Date Route   Pfizer COVID-19 Vaccine 12/08/2019  8:59 AM 0.3 mL 10/12/2019 Intramuscular   Manufacturer: [place]   Lot: [id number]   [place]: [id number]

## 2019-12-16 ENCOUNTER — Ambulatory Visit: Payer: Medicare HMO

## 2019-12-18 ENCOUNTER — Other Ambulatory Visit: Payer: Self-pay

## 2019-12-18 ENCOUNTER — Ambulatory Visit (INDEPENDENT_AMBULATORY_CARE_PROVIDER_SITE_OTHER): Payer: Medicare HMO | Admitting: Medical

## 2019-12-18 ENCOUNTER — Encounter: Payer: Self-pay | Admitting: Medical

## 2019-12-18 VITALS — BP 126/60 | HR 72 | Temp 98.0°F | Ht 62.0 in | Wt 171.8 lb

## 2019-12-18 DIAGNOSIS — R300 Dysuria: Secondary | ICD-10-CM

## 2019-12-18 DIAGNOSIS — N3281 Overactive bladder: Secondary | ICD-10-CM | POA: Diagnosis not present

## 2019-12-18 LAB — POCT URINALYSIS DIP (PROADVANTAGE DEVICE)
Bilirubin, UA: NEGATIVE
Blood, UA: NEGATIVE
Glucose, UA: 100 mg/dL — AB
Ketones, POC UA: NEGATIVE mg/dL
Leukocytes, UA: NEGATIVE
Nitrite, UA: NEGATIVE
Protein Ur, POC: NEGATIVE mg/dL
Specific Gravity, Urine: 1.02
Urobilinogen, Ur: NEGATIVE
pH, UA: 6 (ref 5.0–8.0)

## 2019-12-18 MED ORDER — HYDROCODONE-ACETAMINOPHEN 5-325 MG PO TABS: 1.0000 | ORAL_TABLET | Freq: Four times a day (QID) | ORAL | 0 refills | Status: DC | PRN

## 2019-12-18 NOTE — Patient Instructions (Signed)
Urine today doesn't show infection  We will send urine for culture    For overactive bladder, call insurance about options for therapy:  Myrbetriq Detrol Vesicare Oxybutynin Enablex Toviaz  Overactive Bladder, Adult  Overactive bladder refers to a condition in which a person has a sudden need to pass urine. The person may leak urine if he or she cannot get to the bathroom fast enough (urinary incontinence). A person with this condition may also wake up several times in the night to go to the bathroom. Overactive bladder is associated with poor nerve signals between your bladder and your brain. Your bladder may get the signal to empty before it is full. You may also have very sensitive muscles that make your bladder squeeze too soon. These symptoms might interfere with daily work or social activities. What are the causes? This condition may be associated with or caused by:  Urinary tract infection.  Infection of nearby tissues, such as the prostate.  Prostate enlargement.  Surgery on the uterus or urethra.  Bladder stones, inflammation, or tumors.  Drinking too much caffeine or alcohol.  Certain medicines, especially medicines that get rid of extra fluid in the body (diuretics).  Muscle or nerve weakness, especially from: ? A spinal cord injury. ? Stroke. ? Multiple sclerosis. ? Parkinson's disease.  Diabetes.  Constipation. What increases the risk? You may be at greater risk for overactive bladder if you:  Are an older adult.  Smoke.  Are going through menopause.  Have prostate problems.  Have a neurological disease, such as stroke, dementia, Parkinson's disease, or multiple sclerosis (MS).  Eat or drink things that irritate the bladder. These include alcohol, spicy food, and caffeine.  Are overweight or obese. What are the signs or symptoms? Symptoms of this condition include:  Sudden, strong urge to urinate.  Leaking urine.  Urinating 8 or more  times a day.  Waking up to urinate 2 or more times a night. How is this diagnosed? Your health care provider may suspect overactive bladder based on your symptoms. He or she will diagnose this condition by:  A physical exam and medical history.  Blood or urine tests. You might need bladder or urine tests to help determine what is causing your overactive bladder. You might also need to see a health care provider who specializes in urinary tract problems (urologist). How is this treated? Treatment for overactive bladder depends on the cause of your condition and whether it is mild or severe. You can also make lifestyle changes at home. Options include:  Bladder training. This may include: ? Learning to control the urge to urinate by following a schedule that directs you to urinate at regular intervals (timed voiding). ? Doing Kegel exercises to strengthen your pelvic floor muscles, which support your bladder. Toning these muscles can help you control urination, even if your bladder muscles are overactive.  Special devices. This may include: ? Biofeedback, which uses sensors to help you become aware of your body's signals. ? Electrical stimulation, which uses electrodes placed inside the body (implanted) or outside the body. These electrodes send gentle pulses of electricity to strengthen the nerves or muscles that control the bladder. ? Women may use a plastic device that fits into the vagina and supports the bladder (pessary).  Medicines. ? Antibiotics to treat bladder infection. ? Antispasmodics to stop the bladder from releasing urine at the wrong time. ? Tricyclic antidepressants to relax bladder muscles. ? Injections of botulinum toxin type A directly into the  bladder tissue to relax bladder muscles.  Lifestyle changes. This may include: ? Weight loss. Talk to your health care provider about weight loss methods that would work best for you. ? Diet changes. This may include reducing  how much alcohol and caffeine you consume, or drinking fluids at different times of the day. ? Not smoking. Do not use any products that contain nicotine or tobacco, such as cigarettes and e-cigarettes. If you need help quitting, ask your health care provider.  Surgery. ? A device may be implanted to help manage the nerve signals that control urination. ? An electrode may be implanted to stimulate electrical signals in the bladder. ? A procedure may be done to change the shape of the bladder. This is done only in very severe cases. Follow these instructions at home: Lifestyle  Make any diet or lifestyle changes that are recommended by your health care provider. These may include: ? Drinking less fluid or drinking fluids at different times of the day. ? Cutting down on caffeine or alcohol. ? Doing Kegel exercises. ? Losing weight if needed. ? Eating a healthy and balanced diet to prevent constipation. This may include:  Eating foods that are high in fiber, such as fresh fruits and vegetables, whole grains, and beans.  Limiting foods that are high in fat and processed sugars, such as fried and sweet foods. General instructions  Take over-the-counter and prescription medicines only as told by your health care provider.  If you were prescribed an antibiotic medicine, take it as told by your health care provider. Do not stop taking the antibiotic even if you start to feel better.  Use any implants or pessary as told by your health care provider.  If needed, wear pads to absorb urine leakage.  Keep a journal or log to track how much and when you drink and when you feel the need to urinate. This will help your health care provider monitor your condition.  Keep all follow-up visits as told by your health care provider. This is important. Contact a health care provider if:  You have a fever.  Your symptoms do not get better with treatment.  Your pain and discomfort get worse.  You  have more frequent urges to urinate. Get help right away if:  You are not able to control your bladder. Summary  Overactive bladder refers to a condition in which a person has a sudden need to pass urine.  Several conditions may lead to an overactive bladder.  Treatment for overactive bladder depends on the cause and severity of your condition.  Follow your health care provider's instructions about lifestyle changes, doing Kegel exercises, keeping a journal, and taking medicines. This information is not intended to replace advice given to you by your health care provider. Make sure you discuss any questions you have with your health care provider. Document Revised: 02/08/2019 Document Reviewed: 11/03/2017 Elsevier Patient Education  [place].

## 2019-12-18 NOTE — Addendum Note (Signed)
Addended by: Carlena Hurl on: 12/18/2019 11:15 AM   Modules accepted: Orders

## 2019-12-18 NOTE — Addendum Note (Signed)
Addended by: Edgar Frisk on: 12/18/2019 02:12 PM   Modules accepted: Orders

## 2019-12-18 NOTE — Progress Notes (Addendum)
Subjective: Chief Complaint  Patient presents with  . Hematuria   Here for blood in urine.  Had been on Trimethoprim daily for prophylaxis through urology.  She had run out of this.  She went in to Urology and spoke with them about the medication and her symptoms.   They wanted her to use a procedure with electrical stimulation to help correct OAB.  She was under the impression it wouldn't cost her anything out of pocket.   She called insurer and learned that her copy would actually be $375 each time she had the procedure, so her income won't cover this.   She called urology back and they advised she contact PCP.  She notes discomfort with urination.   Tried Azo a few days.   Having some increased urination.   Has some urine odor, some urine appears cloudy, feels like there is a whitish debris in the urine.   Has been using the Trimethoprim daily for a year.  No fever, no nausea, no vomiting, no abdominal pain but has some lower abdominal pressure.  Sometimes has some back pain.  No diarrhea.    No vaginal itching or discharge.   Has been tolerating [place] just fine.   No other aggravating or relieving factors. No other complaint.   Past Medical History:  Diagnosis Date  . Atrophic vaginitis    estrace cream rx'd by Dr. Karsten Ro  . Colon polyp    tubular adenoma (colonoscopy q5 yrs)  . Diabetes mellitus 2005  . Diverticulosis   . Dyslipidemia   . GAD (generalized anxiety disorder)   . Hemorrhoids   . OAB (overactive bladder)   . Osteoporosis   . Psoriasis    mild, sees [facility]  . Smoker    Current Outpatient Medications on File Prior to Visit  Medication Sig Dispense Refill  . B-D UF III MINI PEN NEEDLES 31G X 5 MM MISC USE 1 DAILY 100 each 1  . dapagliflozin propanediol (FARXIGA) 10 MG TABS tablet Take 10 mg by mouth daily. 90 tablet 3  . LEVEMIR FLEXTOUCH 100 UNIT/ML Pen INJECT 35 UNITS INTO THE SKIN DAILY 15 mL 0  . lisinopril (ZESTRIL) 5 MG tablet Take 1 tablet (5  mg total) by mouth daily. 90 tablet 3  . ONETOUCH ULTRA test strip USE AS DIRECTED TO TEST BLOOD SUGARS TWICE A DAY 200 strip 3  . rosuvastatin (CRESTOR) 20 MG tablet Take 1 tablet (20 mg total) by mouth daily. 90 tablet 3  . triamcinolone cream (KENALOG) 0.1 % APPLY ON THE SKIN TWICE A DAY AS NEEDED    . trimethoprim (TRIMPEX) 100 MG tablet Take 100 mg by mouth daily.     No current facility-administered medications on file prior to visit.   ROS as in subjective   Objective: BP 126/60   Pulse 72   Temp 98 F (36.7 C)   Ht 5\' 2"  (1.575 m)   Wt 171 lb 12.8 oz (77.9 kg)   SpO2 96%   BMI 31.42 kg/m   Gen: wd, wn, nad Abdomen: nontender, no mass, no organomegaly Back nontender    Assessment: Encounter Diagnoses  Name Primary?  . OAB (overactive bladder) Yes  . Dysuria      Plan: Discussed symptoms, concerns, recent urology visit.  We will request urology notes.  C/t trimethoprim daily for prophylaxis against recurrent UTI.   Urine culture sent today  Discussed OAB medications. She will check insurance coverage for these.  Urology recommended Myrbetriq but  this was too expensive.      Azalie was seen today for hematuria.  Diagnoses and all orders for this visit:  OAB (overactive bladder) -     Urine Culture  Dysuria -     Urine Culture  Other orders -     HYDROcodone-acetaminophen (NORCO) 5-325 MG tablet; Take 1 tablet by mouth every 6 (six) hours as needed.

## 2019-12-20 ENCOUNTER — Other Ambulatory Visit: Payer: Self-pay | Admitting: Medical

## 2019-12-20 LAB — URINE CULTURE

## 2019-12-20 MED ORDER — CIPROFLOXACIN HCL 500 MG PO TABS: 500.0000 mg | ORAL_TABLET | Freq: Two times a day (BID) | ORAL | 0 refills | Status: AC

## 2019-12-20 MED ORDER — OXYBUTYNIN CHLORIDE 5 MG PO TABS: 5.0000 mg | ORAL_TABLET | Freq: Two times a day (BID) | ORAL | 2 refills | Status: DC

## 2019-12-25 ENCOUNTER — Other Ambulatory Visit: Payer: Self-pay | Admitting: Medical

## 2019-12-25 MED ORDER — OXYBUTYNIN CHLORIDE 5 MG PO TABS: 5.0000 mg | ORAL_TABLET | Freq: Two times a day (BID) | ORAL | 2 refills | Status: DC

## 2019-12-26 ENCOUNTER — Telehealth: Payer: Self-pay | Admitting: Medical

## 2019-12-26 NOTE — Telephone Encounter (Signed)
Requested records received from [facility]

## 2019-12-27 DIAGNOSIS — K601 Chronic anal fissure: Secondary | ICD-10-CM | POA: Diagnosis not present

## 2019-12-27 DIAGNOSIS — K5904 Chronic idiopathic constipation: Secondary | ICD-10-CM | POA: Diagnosis not present

## 2019-12-27 DIAGNOSIS — Z86010 Personal history of colonic polyps: Secondary | ICD-10-CM | POA: Diagnosis not present

## 2019-12-27 DIAGNOSIS — K5730 Diverticulosis of large intestine without perforation or abscess without bleeding: Secondary | ICD-10-CM | POA: Diagnosis not present

## 2019-12-27 MED ORDER — OXYBUTYNIN CHLORIDE 5 MG PO TABS: 5.0000 mg | ORAL_TABLET | Freq: Two times a day (BID) | ORAL | 0 refills | Status: DC

## 2019-12-27 MED ORDER — LEVEMIR FLEXTOUCH 100 UNIT/ML ~~LOC~~ SOPN: PEN_INJECTOR | SUBCUTANEOUS | 2 refills | Status: DC

## 2019-12-27 MED ORDER — BD PEN NEEDLE MINI U/F 31G X 5 MM MISC: 11 refills | Status: DC

## 2020-01-02 ENCOUNTER — Ambulatory Visit: Payer: Medicare HMO | Attending: Internal Medicine

## 2020-01-02 DIAGNOSIS — Z23 Encounter for immunization: Secondary | ICD-10-CM | POA: Insufficient documentation

## 2020-01-02 NOTE — Progress Notes (Signed)
   Covid-19 Vaccination Clinic  Name:  Marissa Oliver    MRN: [id number] DOB: [birth year]  01/02/2020  [name] was observed post Covid-19 immunization for 15 minutes without incident. She was provided with Vaccine Information Sheet and instruction to access the V-Safe system.   [name] was instructed to call 911 with any severe reactions post vaccine: Marland Kitchen Difficulty breathing  . Swelling of face and throat  . A fast heartbeat  . A bad rash all over body  . Dizziness and weakness   Immunizations Administered    Name Date Dose VIS Date Route   Pfizer COVID-19 Vaccine 01/02/2020 10:57 AM 0.3 mL 10/12/2019 Intramuscular   Manufacturer: [place]   Lot: [id number]   [place]: [id number]

## 2020-01-21 ENCOUNTER — Telehealth: Payer: Self-pay

## 2020-01-21 DIAGNOSIS — E118 Type 2 diabetes mellitus with unspecified complications: Secondary | ICD-10-CM

## 2020-01-21 MED ORDER — FARXIGA 10 MG PO TABS: 10.0000 mg | ORAL_TABLET | Freq: Every day | ORAL | 0 refills | Status: DC

## 2020-01-21 NOTE — Telephone Encounter (Signed)
Patient has requested samples of farxiga.

## 2020-01-28 ENCOUNTER — Other Ambulatory Visit: Payer: Self-pay | Admitting: Medical

## 2020-02-18 DIAGNOSIS — R69 Illness, unspecified: Secondary | ICD-10-CM | POA: Diagnosis not present

## 2020-02-21 ENCOUNTER — Telehealth: Payer: Self-pay

## 2020-02-21 NOTE — Telephone Encounter (Signed)
I submitted a PA online for the [place] and [employer] stated it did not need a PA is available with out authorization. So I called the insurance company to see if we could get a teir exemption and they said it was already on the lowest teir for this medication which is teir 3. They said the only thing we can do now is patient assistance. I will check with [name] to see if she can do patient assistance for Marissa Oliver for her Wilder Glade.

## 2020-02-21 NOTE — Telephone Encounter (Signed)
Patient has stated she needs a prior auth for farxiga to see if she can get it lowered to a tier 2 so she don't have to keep getting samples.  Fax number to [phone] for prior auth.

## 2020-02-21 NOTE — Telephone Encounter (Signed)
Can you help with this?

## 2020-02-22 NOTE — Telephone Encounter (Signed)
I believe she herself can go online to the manufacturers website for patient assistance form.  There is a piece I probably have to sign and write a script, but the first portions she can complete.  If you can give her website for that company that can get process started.

## 2020-02-26 ENCOUNTER — Other Ambulatory Visit: Payer: Self-pay | Admitting: Medical

## 2020-02-26 LAB — HM DIABETES EYE EXAM

## 2020-02-27 DIAGNOSIS — Z961 Presence of intraocular lens: Secondary | ICD-10-CM | POA: Diagnosis not present

## 2020-02-27 DIAGNOSIS — H52203 Unspecified astigmatism, bilateral: Secondary | ICD-10-CM | POA: Diagnosis not present

## 2020-02-27 DIAGNOSIS — E119 Type 2 diabetes mellitus without complications: Secondary | ICD-10-CM | POA: Diagnosis not present

## 2020-02-28 ENCOUNTER — Other Ambulatory Visit: Payer: Self-pay | Admitting: Medical

## 2020-02-28 MED ORDER — METFORMIN HCL 500 MG PO TABS
500.0000 mg | ORAL_TABLET | Freq: Every day | ORAL | 0 refills | Status: DC
Start: 2020-02-28 — End: 2020-05-22

## 2020-02-28 MED ORDER — LEVEMIR FLEXTOUCH 100 UNIT/ML ~~LOC~~ SOPN: PEN_INJECTOR | SUBCUTANEOUS | 2 refills | Status: DC

## 2020-03-07 ENCOUNTER — Telehealth: Payer: Self-pay | Admitting: Medical

## 2020-03-07 NOTE — Telephone Encounter (Signed)
See her email message.    Since her sugars are running in the 70s, just stay on the insulin daily for now, and hold off on starting metformin if sugars remain stable once she runs out of the [place]

## 2020-03-07 NOTE — Telephone Encounter (Signed)
Sent message via mychart

## 2020-03-18 ENCOUNTER — Encounter: Payer: Self-pay | Admitting: Medical

## 2020-03-24 DIAGNOSIS — L400 Psoriasis vulgaris: Secondary | ICD-10-CM | POA: Diagnosis not present

## 2020-04-07 ENCOUNTER — Ambulatory Visit (INDEPENDENT_AMBULATORY_CARE_PROVIDER_SITE_OTHER): Payer: Medicare HMO | Admitting: Medical

## 2020-04-07 ENCOUNTER — Other Ambulatory Visit: Payer: Self-pay

## 2020-04-07 ENCOUNTER — Encounter: Payer: Self-pay | Admitting: Medical

## 2020-04-07 VITALS — BP 128/58 | HR 60 | Ht 62.0 in | Wt 169.2 lb

## 2020-04-07 DIAGNOSIS — Z0000 Encounter for general adult medical examination without abnormal findings: Secondary | ICD-10-CM

## 2020-04-07 DIAGNOSIS — G479 Sleep disorder, unspecified: Secondary | ICD-10-CM

## 2020-04-07 DIAGNOSIS — G4700 Insomnia, unspecified: Secondary | ICD-10-CM

## 2020-04-07 DIAGNOSIS — Z129 Encounter for screening for malignant neoplasm, site unspecified: Secondary | ICD-10-CM

## 2020-04-07 DIAGNOSIS — L409 Psoriasis, unspecified: Secondary | ICD-10-CM | POA: Diagnosis not present

## 2020-04-07 DIAGNOSIS — M810 Age-related osteoporosis without current pathological fracture: Secondary | ICD-10-CM | POA: Diagnosis not present

## 2020-04-07 DIAGNOSIS — E785 Hyperlipidemia, unspecified: Secondary | ICD-10-CM

## 2020-04-07 DIAGNOSIS — F411 Generalized anxiety disorder: Secondary | ICD-10-CM

## 2020-04-07 DIAGNOSIS — R32 Unspecified urinary incontinence: Secondary | ICD-10-CM

## 2020-04-07 DIAGNOSIS — Z794 Long term (current) use of insulin: Secondary | ICD-10-CM

## 2020-04-07 DIAGNOSIS — Z87891 Personal history of nicotine dependence: Secondary | ICD-10-CM

## 2020-04-07 DIAGNOSIS — D692 Other nonthrombocytopenic purpura: Secondary | ICD-10-CM

## 2020-04-07 DIAGNOSIS — R69 Illness, unspecified: Secondary | ICD-10-CM | POA: Diagnosis not present

## 2020-04-07 DIAGNOSIS — R0989 Other specified symptoms and signs involving the circulatory and respiratory systems: Secondary | ICD-10-CM

## 2020-04-07 DIAGNOSIS — K5900 Constipation, unspecified: Secondary | ICD-10-CM

## 2020-04-07 DIAGNOSIS — N3281 Overactive bladder: Secondary | ICD-10-CM

## 2020-04-07 DIAGNOSIS — Z860100 Personal history of colon polyps, unspecified: Secondary | ICD-10-CM

## 2020-04-07 DIAGNOSIS — E1169 Type 2 diabetes mellitus with other specified complication: Secondary | ICD-10-CM | POA: Diagnosis not present

## 2020-04-07 DIAGNOSIS — N952 Postmenopausal atrophic vaginitis: Secondary | ICD-10-CM | POA: Diagnosis not present

## 2020-04-07 DIAGNOSIS — Z7185 Encounter for immunization safety counseling: Secondary | ICD-10-CM

## 2020-04-07 DIAGNOSIS — Z7189 Other specified counseling: Secondary | ICD-10-CM

## 2020-04-07 DIAGNOSIS — E559 Vitamin D deficiency, unspecified: Secondary | ICD-10-CM | POA: Diagnosis not present

## 2020-04-07 DIAGNOSIS — R350 Frequency of micturition: Secondary | ICD-10-CM

## 2020-04-07 NOTE — Progress Notes (Addendum)
Subjective:    Marissa Oliver is a 79 y.o. female who presents for Preventative Services visit and chronic medical problems/med check visit.    Primary Care Provider Domini Vandehei, Camelia Eng, PA-C here for primary care  Current Health Care Team:  Dentist, [place] doctor, Dr. Luberta Mutter, [facility]  [facility]  Lennie Odor, [place] with [facility]  Medical Services you may have received from other than [facility] providers in the past year (date may be approximate) Urology, dermatology  Exercise Current exercise habits: The patient does not participate in regular exercise at present.   Nutrition/Diet Current diet: no dietary discretion  Depression Screen Depression screen PHQ 2/9 04/07/2020  Decreased Interest 0  Down, Depressed, Hopeless 0  PHQ - 2 Score 0  Altered sleeping -  Tired, decreased energy -  Change in appetite -  Feeling bad or failure about yourself  -  Trouble concentrating -  Moving slowly or fidgety/restless -  Suicidal thoughts -  PHQ-9 Score -  Difficult doing work/chores -    Activities of Daily Living Screen/Functional Status Survey Is the patient deaf or have difficulty hearing?: No Does the patient have difficulty seeing, even when wearing glasses/contacts?: No Does the patient have difficulty concentrating, remembering, or making decisions?: No Does the patient have difficulty walking or climbing stairs?: No Does the patient have difficulty dressing or bathing?: No Does the patient have difficulty doing errands alone such as visiting a doctor's office or shopping?: No  Can patient draw a clock face showing 3:15 oclock, yes  Fall Risk Screen Fall Risk  04/07/2020 03/03/2018 02/16/2017 04/29/2016 07/15/2014  Falls in the past year? 0 Yes No No No  Number falls in past yr: - 1 - - -  Injury with Fall? - Yes - - -    Gait Assessment: Normal gait observed -yes  Advanced directives Does patient have a [place]? Yes Does patient have a Living Will? Yes  Past Medical History:  Diagnosis Date   Atrophic vaginitis    estrace cream rx'd by Dr. Karsten Ro   Colon polyp    tubular adenoma (colonoscopy q5 yrs)   Diabetes mellitus 2005   Diverticulosis    Dyslipidemia    GAD (generalized anxiety disorder)    Hemorrhoids    OAB (overactive bladder)    Osteoporosis    Psoriasis    mild, sees [facility]   Smoker     Past Surgical History:  Procedure Laterality Date   ABDOMINAL HYSTERECTOMY  1972   partial   APPENDECTOMY     CARDIOVASCULAR STRESS TEST  12/25/2004   EF 65%. OVERALL NORMAL STRESS CARDIOLITE. NO EVIDENCE  OF ISCHEMIA   COLONOSCOPY  08/2010   cologuard 01/2016 normal   PUBOVAGINAL SLING  04/1998   TUBAL LIGATION      Social History   Socioeconomic History   Marital status: Divorced    Spouse name: Not on file   Number of children: Not on file   Years of education: Not on file   Highest education level: Not on file  Occupational History   Not on file  Tobacco Use   Smoking status: Former Smoker    Packs/day: 0.50    Quit date: 05/22/2012    Years since quitting: 7.8   Smokeless tobacco: Never Used  Substance and Sexual Activity   Alcohol use: No    Alcohol/week: 0.0 standard drinks   Drug use: No   Sexual activity: Not  Currently  Other Topics Concern   Not on file  Social History Narrative   Divorced, 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  Has 3 children.  Exercises some with walking.  Per 08/2018   Social Determinants of Health   Financial Resource Strain:    Difficulty of Paying Living Expenses:   Food Insecurity:    Worried About Charity fundraiser in the Last Year:    Arboriculturist in the Last Year:   Transportation Needs:    Film/video editor (Medical):    Lack of Transportation (Non-Medical):   Physical Activity:    Days of Exercise per Week:    Minutes of Exercise per Session:    Stress:    Feeling of Stress :   Social Connections:    Frequency of Communication with Friends and Family:    Frequency of Social Gatherings with Friends and Family:    Attends Religious Services:    Active Member of Clubs or Organizations:    Attends Music therapist:    Marital Status:   Intimate Partner Violence:    Fear of Current or Ex-Partner:    Emotionally Abused:    Physically Abused:    Sexually Abused:     Family History  Problem Relation Age of Onset   Arthritis Mother    Heart failure Mother    Mental illness Father    Kidney disease Father    Stroke Father    Heart attack Father    Heart disease Brother    Cancer Brother        prostate   Stroke Maternal Aunt    Cancer Maternal Uncle    Stroke Paternal Grandmother      Current Outpatient Medications:    betamethasone dipropionate 0.05 % cream, Apply to areas twice, Disp: , Rfl:    FARXIGA 10 MG TABS tablet, Add'l Sig Add'l Sig oral Add'l Sig, Disp: , Rfl:    insulin detemir (LEVEMIR FLEXTOUCH) 100 UNIT/ML FlexPen, INJECT 40 UNITS INTO THE SKIN DAILY, Disp: 15 mL, Rfl: 2   Insulin Pen Needle (B-D UF III MINI PEN NEEDLES) 31G X 5 MM MISC, USE 1 DAILY, Disp: 100 each, Rfl: 11   LINZESS 290 MCG CAPS capsule, Take 290 mcg by mouth daily., Disp: , Rfl:    lisinopril (ZESTRIL) 5 MG tablet, Take 1 tablet (5 mg total) by mouth daily., Disp: 90 tablet, Rfl: 3   metFORMIN (GLUCOPHAGE) 500 MG tablet, Take 1 tablet (500 mg total) by mouth daily with breakfast., Disp: 90 tablet, Rfl: 0   ONETOUCH ULTRA test strip, USE AS DIRECTED TO TEST BLOOD SUGARS TWICE A DAY, Disp: 200 strip, Rfl: 3   oxybutynin (DITROPAN) 5 MG tablet, Take 1 tablet (5 mg total) by mouth 2 (two) times daily., Disp: 180 tablet, Rfl: 0   rosuvastatin (CRESTOR) 20 MG tablet, Take 1 tablet (20 mg total) by mouth daily., Disp: 90 tablet, Rfl: 3   triamcinolone cream (KENALOG) 0.1 %, APPLY ON THE SKIN TWICE A  DAY AS NEEDED, Disp: , Rfl:    HYDROcodone-acetaminophen (NORCO) 5-325 MG tablet, Take 1 tablet by mouth every 6 (six) hours as needed. (Patient not taking: Reported on 04/07/2020), Disp: 10 tablet, Rfl: 0   trimethoprim (TRIMPEX) 100 MG tablet, Take 100 mg by mouth daily., Disp: , Rfl:   No Known Allergies  History reviewed: allergies, current medications, past family history, past medical history, past social history, past surgical history and problem  list   Chronic issues discussed: Doesn't sleep good at night.   Gets to sleep but awakes for example 2am, can't get back to sleep.    Gets stressed about brother.  Her and bother liver together.  She got houes from her mother, but brother has lifetime rights to the house.  The butt heads sometimes.     Stays hungry all the time.   Currently doing Levemir 38 u QHS and metformin 500mg  once daily.  Was on Farxiga but ran out 3 weeks ago.   She just now got insurance approval but hasn't restarted [place].   No foot lesions, but occasionally gets some cramps in legs  Has bruises on arms more so of late.  No injury or trauma  Not much exercise    Objective:      Biometrics BP (!) 128/58    Pulse 60    Ht 5\' 2"  (1.575 m)    Wt 169 lb 3.2 oz (76.7 kg)    SpO2 98%    BMI 30.95 kg/m   BP Readings from Last 3 Encounters:  04/07/20 (!) 128/58  12/18/19 126/60  10/11/19 130/80   Wt Readings from Last 3 Encounters:  04/07/20 169 lb 3.2 oz (76.7 kg)  12/18/19 171 lb 12.8 oz (77.9 kg)  10/11/19 171 lb 12.8 oz (77.9 kg)     Cognitive Testing  Alert? Yes  Normal Appearance?Yes  Oriented to person? Yes  Place? Yes   Time? Yes  Recall of three objects?  Yes  Can perform simple calculations? Yes  Displays appropriate judgment?Yes  Can read the correct time from a watch face?Yes  General appearance: alert, no distress, WD/WN, white female  Nutritional Status: Inadequate calore intake? no Loss of muscle mass? yes Loss of fat  beneath skin? yes Localized or general edema? no Diminished functional status? no  Other pertinent exam: Neck: supple, no lymphadenopathy, no thyromegaly, no masses Heart: RRR, normal S1, S2, no murmurs Lungs: CTA bilaterally, no wheezes, rhonchi, or rales Abdomen: +bs, soft, non tender, non distended, no masses, no hepatomegaly, no splenomegaly Musculoskeletal: nontender, no swelling, no obvious deformity Extremities: no edema, no cyanosis, no clubbing Pulses: 2+ symmetric, upper and lower extremities, normal cap refill Neurological: alert, oriented x 3, CN2-12 intact, strength normal upper extremities and lower extremities, sensation normal throughout, DTRs 2+ throughout, no cerebellar signs, gait normal Psychiatric: normal affect, behavior normal, pleasant   Diabetic Foot Exam - Simple   Simple Foot Form Visual Inspection No deformities, no ulcerations, no other skin breakdown bilaterally: Yes Sensation Testing Intact to touch and monofilament testing bilaterally: Yes Pulse Check Posterior Tibialis and Dorsalis pulse intact bilaterally: Yes Comments      Assessment:   Encounter Diagnoses  Name Primary?   Type 2 diabetes mellitus with other specified complication, with long-term current use of insulin ([place]) Yes   Medicare annual wellness visit, subsequent    Psoriasis    Osteoporosis, unspecified osteoporosis type, unspecified pathological fracture presence    Overactive bladder    Atrophic vaginitis    Dyslipidemia    Generalized anxiety disorder    Urinary incontinence, unspecified type    Vitamin D deficiency    Screening for cancer    Vaccine counseling    Decreased pulses in feet    Constipation, unspecified constipation type    Former smoker    Senile purpura ([place])    Sleep disturbance    Insomnia, unspecified type    History of colonic polyps  Urinary frequency      Plan:   A preventative services visit was completed today.   During the course of the visit today, we discussed and counseled about appropriate screening and preventive services.  A health risk assessment was established today that included a review of current medications, allergies, social history, family history, medical and preventative health history, biometrics, and preventative screenings to identify potential safety concerns or impairments.  A personalized plan was printed today for your records and use.   Personalized health advice and education was given today to reduce health risks and promote self management and wellness.  Information regarding end of life planning was discussed today.   Significant issues discussed today: Diabetes-counseled on glucometer testing, daily foot checks, compliance with treatment, diet and exercise.  Labs today  Continue ACE inhibitor for renal protection  Psoriasis - managed by dermatology  Osteoporosis: We reviewed her last bone density scan.  We stopped Fosamax in December 2020 for drug holiday as she has been on this medicine greater than 5 years.  She can go for updated bone density anytime now.  Vitamin D deficiency-labs today, continue supplement  Senile purpura-noted on exam  Insomnia, sleep disturbance-counseled on sleep hygiene, consider melatonin versus Benadryl or other.  Discussed risk of falls and sedation  Hyperlipidemia-continue statin  Urinary incontinence-managed by urology, reviewed recent urological notes  Decreased pedal pulses-reviewed ABIs from March 2020.  Her ABI screening shows normal flow to legs but her great toe did show some decreased blood flow    Recommendations:  I recommend a yearly ophthalmology/optometry visit for glaucoma screening and eye checkup  I recommended a yearly dental visit for hygiene and checkup  Advanced directives - discussed nature and purpose of Advanced Directives, encouraged them to complete them if they have not done so and/or encouraged them to  get [place] a copy if they have done this already.  Referrals today: none   Cancer screening recommendations There are different agencies that give recommendations on screening for cancer  The [place] preventative services task force gives recommendations on things like breast cancer screening, colon cancer screening, cervical cancer screening.  Based on the current guidelines, there are no significant recommendations to continue screening at this age.  Your decision to have a mammogram is up to you at this point.   Vaccines: Shingles vaccine:  I recommend you have a shingles vaccine to help prevent shingles or herpes zoster outbreak.   Please call your insurer to inquire about coverage for the Shingrix vaccine given in 2 doses.   Some insurers cover this vaccine after age 70, some cover this after age 39.  If your insurer covers this, then call to schedule appointment to have this vaccine here.  She is up to date on pneumococcal vaccine, Covid vaccine, tetanus vaccine    Marissa Oliver was seen today for medicare wellness.  Diagnoses and all orders for this visit:  Type 2 diabetes mellitus with other specified complication, with long-term current use of insulin (HCC) -     Comprehensive metabolic panel -     Hemoglobin A1c -     Microalbumin/Creatinine Ratio, Urine  Medicare annual wellness visit, subsequent  Psoriasis  Osteoporosis, unspecified osteoporosis type, unspecified pathological fracture presence -     VITAMIN D 25 Hydroxy (Vit-D Deficiency, Fractures)  Overactive bladder  Atrophic vaginitis  Dyslipidemia -     Comprehensive metabolic panel  Generalized anxiety disorder  Urinary incontinence, unspecified type  Vitamin D deficiency -  VITAMIN D 25 Hydroxy (Vit-D Deficiency, Fractures)  Screening for cancer  Vaccine counseling  Decreased pulses in feet  Constipation, unspecified constipation type  Former smoker  Senile purpura ([place])  Sleep  disturbance  Insomnia, unspecified type  History of colonic polyps  Urinary frequency      Medicare Attestation A preventative services visit was completed today.  During the course of the visit the patient was educated and counseled about appropriate screening and preventive services.  A health risk assessment was established with the patient that included a review of current medications, allergies, social history, family history, medical and preventative health history, biometrics, and preventative screenings to identify potential safety concerns or impairments.  A personalized plan was printed today for the patient's records and use.   Personalized health advice and education was given today to reduce health risks and promote self management and wellness.  Information regarding end of life planning was discussed today.  Dorothea Ogle, PA-C   04/07/2020

## 2020-04-08 ENCOUNTER — Telehealth: Payer: Self-pay

## 2020-04-08 LAB — COMPREHENSIVE METABOLIC PANEL
ALT: 20 IU/L (ref 0–32)
AST: 16 IU/L (ref 0–40)
Albumin/Globulin Ratio: 2.2 (ref 1.2–2.2)
Albumin: 4.7 g/dL — ABNORMAL HIGH (ref 3.6–4.6)
Alkaline Phosphatase: 105 IU/L (ref 48–121)
BUN/Creatinine Ratio: 20 (ref 12–28)
BUN: 16 mg/dL (ref 8–27)
Bilirubin Total: 0.3 mg/dL (ref 0.0–1.2)
CO2: 19 mmol/L — ABNORMAL LOW (ref 20–29)
Calcium: 9.6 mg/dL (ref 8.7–10.3)
Chloride: 103 mmol/L (ref 96–106)
Creatinine, Ser: 0.8 mg/dL (ref 0.57–1.00)
GFR calc Af Amer: 80 mL/min/{1.73_m2} (ref >59)
GFR calc non Af Amer: 69 mL/min/{1.73_m2} (ref >59)
Globulin, Total: 2.1 g/dL (ref 1.5–4.5)
Glucose: 165 mg/dL — ABNORMAL HIGH (ref 65–99)
Potassium: 4.5 mmol/L (ref 3.5–5.2)
Sodium: 136 mmol/L (ref 134–144)
Total Protein: 6.8 g/dL (ref 6.0–8.5)

## 2020-04-08 LAB — MICROALBUMIN / CREATININE URINE RATIO
Creatinine, Urine: 49.8 mg/dL
Microalb/Creat Ratio: 7 mg/g creat (ref 0–29)
Microalbumin, Urine: 3.6 ug/mL

## 2020-04-08 LAB — HEMOGLOBIN A1C
Est. average glucose Bld gHb Est-mCnc: 194 mg/dL
Hgb A1c MFr Bld: 8.4 % — ABNORMAL HIGH (ref 4.8–5.6)

## 2020-04-08 LAB — VITAMIN D 25 HYDROXY (VIT D DEFICIENCY, FRACTURES): Vit D, 25-Hydroxy: 45.2 ng/mL (ref 30.0–100.0)

## 2020-04-08 NOTE — Telephone Encounter (Signed)
I called the pt. To check and see if she had received her Wilder Glade from the pt. Assistant program and she said she did receive it.

## 2020-04-09 ENCOUNTER — Other Ambulatory Visit: Payer: Self-pay | Admitting: Medical

## 2020-04-09 ENCOUNTER — Telehealth: Payer: Self-pay | Admitting: Medical

## 2020-04-09 MED ORDER — FARXIGA 10 MG PO TABS: 10.0000 mg | ORAL_TABLET | Freq: Every day | ORAL | 3 refills | Status: DC

## 2020-04-09 MED ORDER — FARXIGA 10 MG PO TABS: ORAL_TABLET | ORAL | 3 refills | Status: DC

## 2020-04-09 MED ORDER — TRAZODONE HCL 50 MG PO TABS
25.0000 mg | ORAL_TABLET | Freq: Every day | ORAL | 1 refills | Status: DC
Start: 2020-04-09 — End: 2020-05-06

## 2020-04-09 NOTE — Progress Notes (Signed)
tra

## 2020-04-09 NOTE — Telephone Encounter (Signed)
°  Fax from [facility] re [place] rx  Per pharmacy missing/inelligible information on rx Please resend new rx with directions

## 2020-04-24 ENCOUNTER — Other Ambulatory Visit: Payer: Self-pay | Admitting: Medical

## 2020-05-03 ENCOUNTER — Other Ambulatory Visit: Payer: Self-pay | Admitting: Medical

## 2020-05-21 ENCOUNTER — Other Ambulatory Visit: Payer: Self-pay | Admitting: Medical

## 2020-05-22 ENCOUNTER — Other Ambulatory Visit: Payer: Self-pay | Admitting: Medical

## 2020-06-04 ENCOUNTER — Other Ambulatory Visit: Payer: Self-pay | Admitting: Medical

## 2020-07-01 ENCOUNTER — Ambulatory Visit: Payer: Medicare HMO | Admitting: Medical

## 2020-07-09 ENCOUNTER — Ambulatory Visit (INDEPENDENT_AMBULATORY_CARE_PROVIDER_SITE_OTHER): Payer: Medicare HMO | Admitting: Medical

## 2020-07-09 ENCOUNTER — Encounter: Payer: Self-pay | Admitting: Medical

## 2020-07-09 ENCOUNTER — Other Ambulatory Visit: Payer: Self-pay

## 2020-07-09 VITALS — BP 124/74 | HR 76 | Temp 97.8°F | Wt 167.0 lb

## 2020-07-09 DIAGNOSIS — Z7189 Other specified counseling: Secondary | ICD-10-CM

## 2020-07-09 DIAGNOSIS — E1169 Type 2 diabetes mellitus with other specified complication: Secondary | ICD-10-CM | POA: Diagnosis not present

## 2020-07-09 DIAGNOSIS — E785 Hyperlipidemia, unspecified: Secondary | ICD-10-CM | POA: Diagnosis not present

## 2020-07-09 DIAGNOSIS — R69 Illness, unspecified: Secondary | ICD-10-CM | POA: Diagnosis not present

## 2020-07-09 DIAGNOSIS — Z7185 Encounter for immunization safety counseling: Secondary | ICD-10-CM

## 2020-07-09 DIAGNOSIS — D692 Other nonthrombocytopenic purpura: Secondary | ICD-10-CM

## 2020-07-09 DIAGNOSIS — Z23 Encounter for immunization: Secondary | ICD-10-CM | POA: Diagnosis not present

## 2020-07-09 DIAGNOSIS — Z794 Long term (current) use of insulin: Secondary | ICD-10-CM | POA: Diagnosis not present

## 2020-07-09 DIAGNOSIS — E559 Vitamin D deficiency, unspecified: Secondary | ICD-10-CM

## 2020-07-09 DIAGNOSIS — F411 Generalized anxiety disorder: Secondary | ICD-10-CM

## 2020-07-09 NOTE — Progress Notes (Addendum)
Subjective:  Marissa Oliver is a 79 y.o. female who presents for Chief Complaint  Patient presents with  . Diabetes    diabetes check no other issues     Here for med check on chronic issues.   Diabetes-she is using 40 units of Levemir daily, Farxiga 10 mg daily, Metformin 500 mg daily Checking sugars, was seeing 60s -80s. Due to lower than usual readings ,she stopped metformin temporarily.   Lately thought back in the 130s off metformin  Hyperlipidemia-compliant with Crestor 20 mg daily  Renal protection-compliant with lisinopril 5 mg daily  Constipation - uses Linzess at times. Sometimes it helps, sometimes it doesn't help.  Daughter moved in with her, and she paid to renovate the house.    No other aggravating or relieving factors.    No other c/o.  Past Medical History:  Diagnosis Date  . Atrophic vaginitis    estrace cream rx'd by Dr. Karsten Ro  . Colon polyp    tubular adenoma (colonoscopy q5 yrs)  . Diabetes mellitus 2005  . Diverticulosis   . Dyslipidemia   . GAD (generalized anxiety disorder)   . Hemorrhoids   . OAB (overactive bladder)   . Osteoporosis   . Psoriasis    mild, sees [facility]  . Smoker      The following portions of the patient's history were reviewed and updated as appropriate: allergies, current medications, past family history, past medical history, past social history, past surgical history and problem list.  ROS Otherwise as in subjective above    Objective: BP 124/74   Pulse 76   Temp 97.8 F (36.6 C)   Wt 167 lb (75.8 kg)   BMI 30.54 kg/m   General appearance: alert, no distress, well developed, well nourished Heart: RRR, normal S1, S2, no murmurs Lungs: CTA bilaterally, no wheezes, rhonchi, or rales Abdomen: +bs, soft, non tender, non distended, no masses, no hepatomegaly, no splenomegaly Pulses: 2+ radial pulses, 2+ pedal pulses, normal cap refill Ext: no edema    Assessment: Encounter Diagnoses  Name  Primary?  . Type 2 diabetes mellitus with other specified complication, with long-term current use of insulin ([place]) Yes  . Dyslipidemia associated with type 2 diabetes mellitus ([place])   . Dyslipidemia   . Need for influenza vaccination   . Senile purpura ([place])   . Generalized anxiety disorder   . Vitamin D deficiency   . Vaccine counseling      Plan: Diabetes-continue Farxiga and Levemir, of note she recently stopped Metformin temporarily.  Labs today, continue glucose monitoring  Dyslipidemia-lipid panel today, continue statin  Vitamin D deficiency-continue supplement  Continue lisinopril for renal protection  Constipation-uses Linzess as needed  Vaccines:  You are up-to-date on Covid vaccine  You are eligible for a booster for Covid vaccine in November  You are up-to-date on pneumococcal vaccine/pneumonia vaccine  You are up-to-date on tetanus vaccine  You are past due on the shingles vaccine  I recommend a yearly flu shot  Counseled on the influenza virus vaccine.  Vaccine information sheet given.   High dose Influenza vaccine given after consent obtained.   Marissa Oliver was seen today for diabetes.  Diagnoses and all orders for this visit:  Type 2 diabetes mellitus with other specified complication, with long-term current use of insulin ([place]) -     Hemoglobin A1c  Dyslipidemia associated with type 2 diabetes mellitus ([place])  Dyslipidemia -     Lipid panel  Need for influenza vaccination -  Flu Vaccine QUAD High Dose(Fluad)  Senile purpura (HCC)  Generalized anxiety disorder  Vitamin D deficiency  Vaccine counseling    Follow up: pending labs

## 2020-07-10 LAB — LIPID PANEL
Chol/HDL Ratio: 2.4 ratio (ref 0.0–4.4)
Cholesterol, Total: 135 mg/dL (ref 100–199)
HDL: 56 mg/dL (ref >39)
LDL Chol Calc (NIH): 58 mg/dL (ref 0–99)
Triglycerides: 118 mg/dL (ref 0–149)
VLDL Cholesterol Cal: 21 mg/dL (ref 5–40)

## 2020-07-10 LAB — HEMOGLOBIN A1C
Est. average glucose Bld gHb Est-mCnc: 166 mg/dL
Hgb A1c MFr Bld: 7.4 % — ABNORMAL HIGH (ref 4.8–5.6)

## 2020-07-30 ENCOUNTER — Other Ambulatory Visit: Payer: Self-pay | Admitting: Medical

## 2020-07-30 DIAGNOSIS — E1169 Type 2 diabetes mellitus with other specified complication: Secondary | ICD-10-CM

## 2020-07-30 DIAGNOSIS — Z794 Long term (current) use of insulin: Secondary | ICD-10-CM

## 2020-07-31 DIAGNOSIS — R69 Illness, unspecified: Secondary | ICD-10-CM | POA: Diagnosis not present

## 2020-08-01 ENCOUNTER — Encounter: Payer: Self-pay | Admitting: Family Medicine

## 2020-08-01 ENCOUNTER — Other Ambulatory Visit: Payer: Self-pay

## 2020-08-01 ENCOUNTER — Other Ambulatory Visit (INDEPENDENT_AMBULATORY_CARE_PROVIDER_SITE_OTHER): Payer: Medicare HMO

## 2020-08-01 ENCOUNTER — Telehealth (INDEPENDENT_AMBULATORY_CARE_PROVIDER_SITE_OTHER): Payer: Medicare HMO | Admitting: Family Medicine

## 2020-08-01 VITALS — Temp 97.0°F | Wt 170.0 lb

## 2020-08-01 DIAGNOSIS — R059 Cough, unspecified: Secondary | ICD-10-CM

## 2020-08-01 DIAGNOSIS — J3489 Other specified disorders of nose and nasal sinuses: Secondary | ICD-10-CM | POA: Diagnosis not present

## 2020-08-01 DIAGNOSIS — R058 Other specified cough: Secondary | ICD-10-CM | POA: Diagnosis not present

## 2020-08-01 LAB — POC COVID19 BINAXNOW: SARS Coronavirus 2 Ag: NEGATIVE

## 2020-08-01 LAB — POCT INFLUENZA A/B
Influenza A, POC: NEGATIVE
Influenza B, POC: NEGATIVE

## 2020-08-01 NOTE — Progress Notes (Signed)
° °  Subjective:  Documentation for virtual telephone call. She will also come to the office for Covid testing.   The patient was located at home. 2 patient identifiers used.  The provider was located in the office. The patient did consent to this visit and is aware of possible charges through their insurance for this visit.  The other persons participating in this telemedicine service was her brother.  Time spent on call was 10 minutes and in review of previous records 15 minutes total.  This virtual service is not related to other E/M service within previous 7 days.   Patient ID: Marissa Oliver, female    DOB: 16-Oct-1941, 79 y.o.   MRN: [id number]  HPI Chief Complaint  Patient presents with   coughing    coughing, no voice, woke up with no voice, not been tested and not going to be tested for covid, no fever.    Complains of a productive cough for the past 2-3 days. States this morning her voice was very hoarse and has improved throughout the day.  She also has rhinorrhea. She took OTC [employer] cold medications.   Sleeping fine. Coughing during the day.   She can still taste and smell.   Does not smoke and denies any underlying lung disease.  Covid vaccines received.   Her daughter has been sick and tested negative for Covid.   Denies fever, chills, dizziness, chest pain, palpitations, shortness of breath, abdominal pain, N/V/D, urinary symptoms, LE edema.      Review of Systems Pertinent positives and negatives in the history of present illness.     Objective:   Physical Exam Temp (!) 97 F (36.1 C)    Wt 170 lb (77.1 kg)    BMI 31.09 kg/m   Alert and oriented.  Speaking in complete sense without difficulty.  Normal speech, mood and thought process.      Assessment & Plan:  Productive cough  Rhinorrhea  She will come to the office for Covid testing.  Discussed that if her test is positive that I will need to discuss plan of care with her.  Discussed that  if her test is negative then we will treat her symptoms.  Advised good hydration, Mucinex and propping herself up at night if she is having drainage.  She will follow-up in the next 2 to 3 days if she is worsening or is not getting back to baseline.  Discussed viral versus bacterial infection in the right now her symptoms do not warrant an antibiotic.

## 2020-08-02 LAB — SARS-COV-2, NAA 2 DAY TAT

## 2020-08-02 LAB — NOVEL CORONAVIRUS, NAA: SARS-CoV-2, NAA: NOT DETECTED

## 2020-08-31 ENCOUNTER — Other Ambulatory Visit: Payer: Self-pay | Admitting: Medical

## 2020-09-09 ENCOUNTER — Other Ambulatory Visit: Payer: Self-pay | Admitting: Medical

## 2020-09-09 ENCOUNTER — Telehealth: Payer: Self-pay | Admitting: Medical

## 2020-09-09 NOTE — Telephone Encounter (Signed)
Pt dropped off form for [employer] that needs to be completed and faxed to number on form. Pt also wants a copy mailed to her. Form placed in folder on [place] desk.

## 2020-09-16 NOTE — Telephone Encounter (Signed)
Form has been placed on your desk.

## 2020-09-16 NOTE — Telephone Encounter (Signed)
Fill out other parts and return

## 2020-09-16 NOTE — Telephone Encounter (Signed)
I have not seen this form.  

## 2020-09-16 NOTE — Telephone Encounter (Signed)
Form has been faxed and mailed per patient.

## 2020-10-15 ENCOUNTER — Telehealth: Payer: Self-pay

## 2020-10-15 ENCOUNTER — Other Ambulatory Visit: Payer: Self-pay | Admitting: Medical

## 2020-10-15 DIAGNOSIS — E118 Type 2 diabetes mellitus with unspecified complications: Secondary | ICD-10-CM

## 2020-10-15 NOTE — Telephone Encounter (Signed)
Pt was made two appointments and requested sample of tresebia. Pt was advised to come by and pick up the sample. [place]

## 2020-10-26 DIAGNOSIS — R69 Illness, unspecified: Secondary | ICD-10-CM | POA: Diagnosis not present

## 2020-11-10 ENCOUNTER — Other Ambulatory Visit: Payer: Self-pay

## 2020-11-10 DIAGNOSIS — E118 Type 2 diabetes mellitus with unspecified complications: Secondary | ICD-10-CM

## 2020-11-10 MED ORDER — ROSUVASTATIN CALCIUM 20 MG PO TABS: 20.0000 mg | ORAL_TABLET | Freq: Every day | ORAL | 0 refills | Status: DC

## 2020-11-10 MED ORDER — LISINOPRIL 5 MG PO TABS: 5.0000 mg | ORAL_TABLET | Freq: Every day | ORAL | 0 refills | Status: DC

## 2020-11-10 MED ORDER — OXYBUTYNIN CHLORIDE 5 MG PO TABS: 5.0000 mg | ORAL_TABLET | Freq: Two times a day (BID) | ORAL | 0 refills | Status: DC

## 2020-11-10 MED ORDER — METFORMIN HCL 500 MG PO TABS: ORAL_TABLET | ORAL | 0 refills | Status: DC

## 2020-11-13 ENCOUNTER — Encounter: Payer: Self-pay | Admitting: Medical

## 2020-11-13 ENCOUNTER — Other Ambulatory Visit: Payer: Self-pay

## 2020-11-13 ENCOUNTER — Ambulatory Visit (INDEPENDENT_AMBULATORY_CARE_PROVIDER_SITE_OTHER): Payer: Medicare Other | Admitting: Medical

## 2020-11-13 VITALS — BP 132/64 | HR 65 | Ht 62.0 in | Wt 165.8 lb

## 2020-11-13 DIAGNOSIS — Z87440 Personal history of urinary (tract) infections: Secondary | ICD-10-CM

## 2020-11-13 DIAGNOSIS — S39012A Strain of muscle, fascia and tendon of lower back, initial encounter: Secondary | ICD-10-CM

## 2020-11-13 DIAGNOSIS — M545 Low back pain, unspecified: Secondary | ICD-10-CM | POA: Diagnosis not present

## 2020-11-13 LAB — POCT URINALYSIS DIP (PROADVANTAGE DEVICE)
Bilirubin, UA: NEGATIVE
Glucose, UA: >=1000 mg/dL — AB
Ketones, POC UA: NEGATIVE mg/dL
Leukocytes, UA: NEGATIVE
Nitrite, UA: NEGATIVE
Protein Ur, POC: NEGATIVE mg/dL
Specific Gravity, Urine: 1.02
Urobilinogen, Ur: 0.2
pH, UA: 5.5 (ref 5.0–8.0)

## 2020-11-13 NOTE — Progress Notes (Signed)
Subjective:  Marissa Oliver is a 80 y.o. female who presents for Chief Complaint  Patient presents with  . Back Pain    Right side back pain lower with certain movement.      Here for right back pain x 2-3 days.   Yesterday was worse.  Hurts with certain movements.  throbs at times.  No recent fall, no injury, no trauma.  No rash.  No dyspnea, no sob, no pain with inspiration or expiration.  No abdominal pain.  No burning with urination, no blood in urine, no cloudy. Did bring home a 24 pack of water which was heavy. This could have been the culprit.    No fever, no night sweats, doesn't feel sick.    Has hx/o recurrent UTI.  On prophylactic antibiotic.    No other aggravating or relieving factors.    No other c/o.  Past Medical History:  Diagnosis Date  . Atrophic vaginitis    estrace cream rx'd by Dr. Karsten Ro  . Colon polyp    tubular adenoma (colonoscopy q5 yrs)  . Diabetes mellitus 2005  . Diverticulosis   . Dyslipidemia   . GAD (generalized anxiety disorder)   . Hemorrhoids   . OAB (overactive bladder)   . Osteoporosis   . Psoriasis    mild, sees [facility]  . Smoker      The following portions of the patient's history were reviewed and updated as appropriate: allergies, current medications, past family history, past medical history, past social history, past surgical history and problem list.  ROS Otherwise as in subjective above    Objective: BP 132/64   Pulse 65   Ht 5\' 2"  (1.575 m)   Wt 165 lb 12.8 oz (75.2 kg)   SpO2 98%   BMI 30.33 kg/m   General appearance: alert, no distress, well developed, well nourished Back: Right lumbar spine paraspinal mild tenderness point tenderness otherwise nontender, mild pain with range of motion in the same area of the right low back, no swelling or deformity Legs nontender, hips normal range of motion without pain Normal leg strength and sensation, negative straight leg raise Abdomen: +bs, soft, non tender,  non distended, no masses, no hepatomegaly, no splenomegaly Pulses: 2+ radial pulses, 2+ pedal pulses, normal cap refill Ext: no edema    Assessment: Encounter Diagnoses  Name Primary?  . Acute right-sided low back pain without sciatica Yes  . Back strain, initial encounter   . History of recurrent UTIs      Plan: Symptoms suggest muscle strain.  Advised a few days of relative rest, stretching, no heavy lifting, can use heat, can alternate Tylenol and ibuprofen over-the-counter.  Advise limited use of ibuprofen but she has already been using this.  If worse or not much improved in the next few days call recheck  We discussed possible short-term sparing use of Robaxin but she declines for now.  Discussed the possibility of sedation  Chronic UTI /hx/o recurrent UTI - no obvious infection today, f/u with urology as planned  Franziska was seen today for back pain.  Diagnoses and all orders for this visit:  Acute right-sided low back pain without sciatica -     POCT Urinalysis DIP (Proadvantage Device)  Back strain, initial encounter  History of recurrent UTIs   Follow up: prn

## 2020-11-14 ENCOUNTER — Telehealth: Payer: Self-pay

## 2020-11-14 NOTE — Telephone Encounter (Signed)
Called [employer] [facility] [employer]  [phone] pt is approved, ex getting ready to ship

## 2020-11-25 ENCOUNTER — Other Ambulatory Visit: Payer: Self-pay | Admitting: Medical

## 2020-11-26 NOTE — Telephone Encounter (Signed)
Recv'd fax that pt's medication was shipped

## 2020-12-02 ENCOUNTER — Other Ambulatory Visit: Payer: Self-pay | Admitting: Medical

## 2020-12-02 ENCOUNTER — Other Ambulatory Visit: Payer: Self-pay

## 2020-12-02 ENCOUNTER — Encounter: Payer: Self-pay | Admitting: Medical

## 2020-12-02 ENCOUNTER — Ambulatory Visit (INDEPENDENT_AMBULATORY_CARE_PROVIDER_SITE_OTHER): Payer: Medicare HMO | Admitting: Medical

## 2020-12-02 VITALS — BP 122/84 | HR 63 | Ht 62.0 in | Wt 167.2 lb

## 2020-12-02 DIAGNOSIS — G4700 Insomnia, unspecified: Secondary | ICD-10-CM

## 2020-12-02 DIAGNOSIS — K5900 Constipation, unspecified: Secondary | ICD-10-CM

## 2020-12-02 DIAGNOSIS — Z780 Asymptomatic menopausal state: Secondary | ICD-10-CM | POA: Diagnosis not present

## 2020-12-02 DIAGNOSIS — E785 Hyperlipidemia, unspecified: Secondary | ICD-10-CM | POA: Diagnosis not present

## 2020-12-02 DIAGNOSIS — Z794 Long term (current) use of insulin: Secondary | ICD-10-CM | POA: Diagnosis not present

## 2020-12-02 DIAGNOSIS — H9190 Unspecified hearing loss, unspecified ear: Secondary | ICD-10-CM | POA: Diagnosis not present

## 2020-12-02 DIAGNOSIS — Z23 Encounter for immunization: Secondary | ICD-10-CM

## 2020-12-02 DIAGNOSIS — E1169 Type 2 diabetes mellitus with other specified complication: Secondary | ICD-10-CM | POA: Diagnosis not present

## 2020-12-02 DIAGNOSIS — Z136 Encounter for screening for cardiovascular disorders: Secondary | ICD-10-CM | POA: Diagnosis not present

## 2020-12-02 DIAGNOSIS — E2839 Other primary ovarian failure: Secondary | ICD-10-CM | POA: Diagnosis not present

## 2020-12-02 MED ORDER — BASAGLAR KWIKPEN 100 UNIT/ML ~~LOC~~ SOPN: 40.0000 [IU] | PEN_INJECTOR | Freq: Every day | SUBCUTANEOUS | 1 refills | Status: DC

## 2020-12-02 NOTE — Progress Notes (Signed)
Subjective:   Marissa Oliver is an 80 y.o. female who presents for follow up of Type 2 diabetes mellitus and med check.  Chief Complaint  Patient presents with  . Diabetes    Date of diagnosis of diabetes: 2017 Current treatments: Farxiga 10 mg daily, Levemir or long-acting insulin, 30 units nightly, Metformin 500 mg daily Medication compliance: good, but relies on samples due to do limited income and insurance coverage  patient is checking home blood sugars.   Home blood sugar records: BGs consistently in an acceptable range Current symptoms include: none. Patient denies foot ulcerations, hypoglycemia , increased appetite, nausea, paresthesia of the feet, polydipsia and visual disturbances.  Patient is checking their feet daily. Foot concerns (callous, ulcer, wound, thickened nails, toenail fungus, skin fungus, hammer toe): none Last dilated eye exam within past year. Current diet: in general, a "healthy" diet   Current exercise: walking   Hyperlipidemia-compliant  with Crestor 20 mg daily without complaint  Chronic constipation-uses Linzess with some improvement.  She plans to see gastro soon  Chronic dysuria, overactive bladder, urinary incontinence, history of urinary tract infection-sees urology and has follow-up with them soon.  Is on prophylactic antibiotic  Insomnia-doing okay on trazodone  Uses lisinopril for kidney protection.  She does not check blood pressures at home  She had a recent hearing screen and audiology office and may end up getting any hearing aids with due to some mild hearing loss  Past Medical History:  Diagnosis Date  . Atrophic vaginitis    estrace cream rx'd by Dr. Karsten Ro  . Colon polyp    tubular adenoma (colonoscopy q5 yrs)  . Diabetes mellitus 2005  . Diverticulosis   . Dyslipidemia   . GAD (generalized anxiety disorder)   . Hemorrhoids   . OAB (overactive bladder)   . Osteoporosis   . Psoriasis    mild, sees [facility]  .  Smoker     Current Outpatient Medications on File Prior to Visit  Medication Sig Dispense Refill  . betamethasone dipropionate 0.05 % cream Apply to areas twice    . FARXIGA 10 MG TABS tablet Take 1 tablet (10 mg total) by mouth daily. 90 tablet 3  . insulin detemir (LEVEMIR FLEXTOUCH) 100 UNIT/ML FlexPen INJECT 40 UNITS INTO THE SKIN DAILY 15 mL 2  . Insulin Pen Needle (B-D UF III MINI PEN NEEDLES) 31G X 5 MM MISC USE ONCE DAILY 100 each 1  . LINZESS 290 MCG CAPS capsule Take 290 mcg by mouth daily.     Marland Kitchen lisinopril (ZESTRIL) 5 MG tablet Take 1 tablet (5 mg total) by mouth daily. 90 tablet 0  . metFORMIN (GLUCOPHAGE) 500 MG tablet TAKE 1 TABLET BY MOUTH EVERY DAY WITH BREAKFAST 90 tablet 0  . ONETOUCH ULTRA test strip USE AS DIRECTED TO TEST BLOOD SUGARS TWICE A DAY 200 strip 1  . oxybutynin (DITROPAN) 5 MG tablet Take 1 tablet (5 mg total) by mouth 2 (two) times daily. 180 tablet 0  . triamcinolone cream (KENALOG) 0.1 % APPLY ON THE SKIN TWICE A DAY AS NEEDED    . trimethoprim (TRIMPEX) 100 MG tablet Take 100 mg by mouth daily.     No current facility-administered medications on file prior to visit.     The following portions of the patient's history were reviewed and updated as appropriate: allergies, current medications, past family history, past medical history, past social history, past surgical history and problem list.  ROS as in subjective above  Objective:   BP 122/84   Pulse 63   Ht 5\' 2"  (1.575 m)   Wt 167 lb 3.2 oz (75.8 kg)   SpO2 97%   BMI 30.58 kg/m   General appearence: alert, no distress, WD/WN,  Neck: supple, no lymphadenopathy, no thyromegaly, no masses Heart: RRR, normal S1, S2, no murmurs Lungs: CTA bilaterally, no wheezes, rhonchi, or rales Pulses: 2+ symmetric, upper and lower extremities, normal cap refill   Diabetic Foot Exam - Simple   Simple Foot Form Diabetic Foot exam was performed with the following findings: Yes 12/02/2020  1:45 PM  Visual  Inspection No deformities, no ulcerations, no other skin breakdown bilaterally: Yes Sensation Testing Intact to touch and monofilament testing bilaterally: Yes Pulse Check See comments: Yes Comments 1+ pedal pulses     Assessment:   Encounter Diagnoses  Name Primary?  . Dyslipidemia associated with type 2 diabetes mellitus ([place]) Yes  . Type 2 diabetes mellitus with other specified complication, with long-term current use of insulin ([place])   . Constipation, unspecified constipation type   . Need for pneumococcal vaccination   . Hearing loss, unspecified hearing loss type, unspecified laterality   . Estrogen deficiency   . Post-menopausal   . Encounter for screening for vascular disease   . Screening for heart disease   . Insomnia, unspecified type      Plan:   Diabetes Mellitus type 2: Education: Reviewed 'ABCs' of diabetes management (respective goals in parentheses):  A1C (<7), blood pressure (<130/80), and cholesterol (LDL <100)  Diabetes: Medications: Continue Metformin 500 daily, Farxiga 10 mg daily that she gets 3 through pharmaceutical program from manufacture, continue basal insulin 30 units daily, and she uses a combination of prescriptions and samples due to financial limitations  Education today included:  blood sugar goals, complications of diabetes mellitus, hypoglycemia prevention and treatment, exercise and self-monitoring of blood glucose skills Advised daily foot checks, yearly diabetic eye exams, dental hygiene Compliance at present is estimated to be good. Efforts to improve compliance (if necessary) will be directed at increased exercise.  Hearing loss-seeing audiology, considering hearing aids soon  Urinary incontinence, dysuria, and history urinary tract infection-follow-up with urology as planned, continue prophylactic antibiotic  Estrogen deficiency, postmenopausal-advise she call and schedule her updated bone density test  Constipation-some benefit  with Linzess, continue good fiber and water intake, follow-up with GI as planned soon  Insomnia-no recent complaint  Hyperlipidemia-continue Crestor, reviewed labs at goal from September 2021  Continue lisinopril for renal protection, reviewed normal urine micro albumin test from June 2021   Sameeha was seen today for diabetes.  Diagnoses and all orders for this visit:  Dyslipidemia associated with type 2 diabetes mellitus ([place]) -     Comprehensive metabolic panel -     VAS [place] ABI WITH/WO TBI; Future -     Ambulatory referral to Cardiology  Type 2 diabetes mellitus with other specified complication, with long-term current use of insulin ([facility]) -     Comprehensive metabolic panel -     CBC with Differential/Platelet -     Hemoglobin A1c -     VAS [place] ABI WITH/WO TBI; Future -     Ambulatory referral to Cardiology  Constipation, unspecified constipation type -     Comprehensive metabolic panel -     CBC with Differential/Platelet  Need for pneumococcal vaccination  Hearing loss, unspecified hearing loss type, unspecified laterality  Estrogen deficiency -     DG Bone Density; Future  Post-menopausal -  DG Bone Density; Future  Encounter for screening for vascular disease -     VAS [place] ABI WITH/WO TBI; Future  Screening for heart disease -     VAS [place] ABI WITH/WO TBI; Future -     Ambulatory referral to Cardiology  Insomnia, unspecified type  Other orders -     Insulin Glargine (BASAGLAR KWIKPEN) 100 UNIT/ML; Inject 40 Units into the skin daily. -     Pneumococcal polysaccharide vaccine 23-valent greater than or equal to 2yo subcutaneous/IM  Spent > 45 minutes including face to face with patient in discussion of symptoms, evaluation, plan and recommendations.    f/u pending studies

## 2020-12-02 NOTE — Progress Notes (Signed)
Done

## 2020-12-03 ENCOUNTER — Other Ambulatory Visit: Payer: Self-pay | Admitting: Medical

## 2020-12-03 ENCOUNTER — Other Ambulatory Visit: Payer: Self-pay

## 2020-12-03 DIAGNOSIS — R0989 Other specified symptoms and signs involving the circulatory and respiratory systems: Secondary | ICD-10-CM

## 2020-12-03 LAB — CBC WITH DIFFERENTIAL/PLATELET
Basophils Absolute: 0 10*3/uL (ref 0.0–0.2)
Basos: 0 %
EOS (ABSOLUTE): 0.1 10*3/uL (ref 0.0–0.4)
Eos: 1 %
Hematocrit: 42 % (ref 34.0–46.6)
Hemoglobin: 14.4 g/dL (ref 11.1–15.9)
Immature Grans (Abs): 0 10*3/uL (ref 0.0–0.1)
Immature Granulocytes: 0 %
Lymphocytes Absolute: 1.6 10*3/uL (ref 0.7–3.1)
Lymphs: 27 %
MCH: 29.6 pg (ref 26.6–33.0)
MCHC: 34.3 g/dL (ref 31.5–35.7)
MCV: 86 fL (ref 79–97)
Monocytes Absolute: 0.3 10*3/uL (ref 0.1–0.9)
Monocytes: 5 %
Neutrophils Absolute: 3.8 10*3/uL (ref 1.4–7.0)
Neutrophils: 67 %
Platelets: 154 10*3/uL (ref 150–450)
RBC: 4.86 x10E6/uL (ref 3.77–5.28)
RDW: 13.1 % (ref 11.7–15.4)
WBC: 5.8 10*3/uL (ref 3.4–10.8)

## 2020-12-03 LAB — COMPREHENSIVE METABOLIC PANEL
ALT: 18 IU/L (ref 0–32)
AST: 12 IU/L (ref 0–40)
Albumin/Globulin Ratio: 2.4 — ABNORMAL HIGH (ref 1.2–2.2)
Albumin: 4.6 g/dL (ref 3.6–4.6)
Alkaline Phosphatase: 98 IU/L (ref 44–121)
BUN/Creatinine Ratio: 30 — ABNORMAL HIGH (ref 12–28)
BUN: 22 mg/dL (ref 8–27)
Bilirubin Total: 0.3 mg/dL (ref 0.0–1.2)
CO2: 21 mmol/L (ref 20–29)
Calcium: 9.8 mg/dL (ref 8.7–10.3)
Chloride: 104 mmol/L (ref 96–106)
Creatinine, Ser: 0.74 mg/dL (ref 0.57–1.00)
GFR calc Af Amer: 88 mL/min/{1.73_m2} (ref >59)
GFR calc non Af Amer: 76 mL/min/{1.73_m2} (ref >59)
Globulin, Total: 1.9 g/dL (ref 1.5–4.5)
Glucose: 124 mg/dL — ABNORMAL HIGH (ref 65–99)
Potassium: 4.2 mmol/L (ref 3.5–5.2)
Sodium: 139 mmol/L (ref 134–144)
Total Protein: 6.5 g/dL (ref 6.0–8.5)

## 2020-12-03 LAB — HEMOGLOBIN A1C
Est. average glucose Bld gHb Est-mCnc: 169 mg/dL
Hgb A1c MFr Bld: 7.5 % — ABNORMAL HIGH (ref 4.8–5.6)

## 2020-12-04 ENCOUNTER — Telehealth: Payer: Self-pay | Admitting: Family Medicine

## 2020-12-04 NOTE — Telephone Encounter (Signed)
Spoke to Marissa Oliver and she will inform Marissa Oliver that we typically don't do prior auth for ABI

## 2020-12-04 NOTE — Telephone Encounter (Signed)
Marissa Oliver with [facility] wants to know if a prior authorization is needed for ABI on legs. Please call Marissa Oliver [phone]

## 2020-12-08 ENCOUNTER — Telehealth (HOSPITAL_COMMUNITY): Payer: Self-pay

## 2020-12-08 NOTE — Progress Notes (Signed)
Cardiology Office Note:    Date:  12/11/2020   ID:  CRYSTALLYNN NOORANI, DOB 04-Jun-1941, MRN [id number]  PCP:  Carlena Hurl, PA-C  [facility] Cardiologist:  Freada Bergeron, MD  [facility] Electrophysiologist:  None   Referring MD: Carlena Hurl, PA-C    History of Present Illness:    Marissa Oliver is a 80 y.o. female with a hx of DMII, GAD, HTN, HLD and toacco use who was referred by Dr. Glade Lloyd for further evaluation of suspected PAD.  Patient last seen in clinic on 12/2016 by Richardson Dopp for chronic dyspnea on exertion, HTN and HLD. She was doing well on that visit with no changes in her medications. She was referred back to Cardiology due to abnormal TBI on the left side.  The patient states that she is doing overall well. Has occasional leg cramps that does not occur with exertion. Mainly notice them when she lays down at night. Walks around the house without issues. No chest pain, SOB on exertion, LE edema, orthopnea, PND. No foot wounds or ulcerations. Has varicose veins that are stable. Denies current tobacco use.  Past Medical History:  Diagnosis Date  . Atrophic vaginitis    estrace cream rx'd by Dr. Karsten Ro  . Colon polyp    tubular adenoma (colonoscopy q5 yrs)  . Diabetes mellitus 2005  . Diverticulosis   . Dyslipidemia   . GAD (generalized anxiety disorder)   . Hemorrhoids   . OAB (overactive bladder)   . Osteoporosis   . Psoriasis    mild, sees [facility]  . Smoker     Past Surgical History:  Procedure Laterality Date  . ABDOMINAL HYSTERECTOMY  1972   partial  . APPENDECTOMY    . CARDIOVASCULAR STRESS TEST  12/25/2004   EF 65%. OVERALL NORMAL STRESS CARDIOLITE. NO EVIDENCE  OF ISCHEMIA  . COLONOSCOPY  08/2010   cologuard 01/2016 normal  . PUBOVAGINAL SLING  04/1998  . TUBAL LIGATION      Current Medications: Current Meds  Medication Sig  . aspirin EC 81 MG tablet Take 1 tablet (81 mg total) by mouth every other day. Swallow  whole.  . betamethasone dipropionate 0.05 % cream Apply to areas twice  . FARXIGA 10 MG TABS tablet Take 1 tablet (10 mg total) by mouth daily.  . Insulin Glargine (BASAGLAR KWIKPEN) 100 UNIT/ML Inject 40 Units into the skin daily.  . Insulin Pen Needle (B-D UF III MINI PEN NEEDLES) 31G X 5 MM MISC USE ONCE DAILY  . LINZESS 290 MCG CAPS capsule Take 290 mcg by mouth daily.   Marland Kitchen lisinopril (ZESTRIL) 5 MG tablet Take 1 tablet (5 mg total) by mouth daily.  . metFORMIN (GLUCOPHAGE) 500 MG tablet TAKE 1 TABLET BY MOUTH EVERY DAY WITH BREAKFAST  . ONETOUCH ULTRA test strip USE AS DIRECTED TO TEST BLOOD SUGARS TWICE A DAY  . oxybutynin (DITROPAN) 5 MG tablet Take 1 tablet (5 mg total) by mouth 2 (two) times daily.  . rosuvastatin (CRESTOR) 20 MG tablet TAKE 1 TABLET BY MOUTH EVERY DAY  . triamcinolone cream (KENALOG) 0.1 % APPLY ON THE SKIN TWICE A DAY AS NEEDED  . trimethoprim (TRIMPEX) 100 MG tablet Take 100 mg by mouth daily.     Allergies:   Patient has no known allergies.   Social History   Socioeconomic History  . Marital status: Divorced    Spouse name: Not on file  . Number of children: Not on file  .  Years of education: Not on file  . Highest education level: Not on file  Occupational History  . Not on file  Tobacco Use  . Smoking status: Former Smoker    Packs/day: 0.50    Quit date: 05/22/2012    Years since quitting: 8.5  . Smokeless tobacco: Never Used  Vaping Use  . Vaping Use: Never used  Substance and Sexual Activity  . Alcohol use: No    Alcohol/week: 0.0 standard drinks  . Drug use: No  . Sexual activity: Not Currently  Other Topics Concern  . 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  Has 3 children.  Exercises some with walking.  Per 08/2018   Social Determinants of Health   Financial Resource Strain: Not on file  Food Insecurity: Not on file  Transportation Needs: Not on file  Physical Activity: Not on  file  Stress: Not on file  Social Connections: Not on file     Family History: The patient's family history includes Arthritis in her mother; Cancer in her brother and maternal uncle; Heart attack in her father; Heart disease in her brother; Heart failure in her mother; Kidney disease in her father; Mental illness in her father; Stroke in her father, maternal aunt, and paternal grandmother.  ROS:   Please see the history of present illness.    Review of Systems  Constitutional: Negative for chills and fever.  HENT: Positive for hearing loss.   Eyes: Negative for blurred vision and redness.  Respiratory: Negative for shortness of breath.   Cardiovascular: Negative for chest pain, palpitations, orthopnea, claudication, leg swelling and PND.  Gastrointestinal: Positive for heartburn. Negative for melena.  Genitourinary: Negative for hematuria.  Musculoskeletal: Positive for joint pain. Negative for falls.  Neurological: Negative for dizziness and loss of consciousness.  Endo/Heme/Allergies: Negative for polydipsia.  Psychiatric/Behavioral: Negative for substance abuse.    EKGs/Labs/Other Studies Reviewed:    The following studies were reviewed today: ABI 01/07/2019: Summary:  Right: Resting right ankle-brachial index is within normal range. No  evidence of significant right lower extremity arterial disease. The right  toe-brachial index is normal.   Left: Resting left ankle-brachial index is within normal range. No  evidence of significant left lower extremity arterial disease. The left  toe-brachial index is abnormal.   Myoview 2006: Normal  EKG:  EKG is  ordered today.  The ekg ordered today demonstrates NSR with HR 63  Recent Labs: 12/02/2020: ALT 18; BUN 22; Creatinine, Ser 0.74; Hemoglobin 14.4; Platelets 154; Potassium 4.2; Sodium 139  Recent Lipid Panel    Component Value Date/Time   CHOL 135 07/09/2020 1206   TRIG 118 07/09/2020 1206   HDL 56 07/09/2020 1206   CHOLHDL  2.4 07/09/2020 1206   CHOLHDL 2.6 08/18/2017 1024   VLDL 29 05/18/2017 0842   LDLCALC 58 07/09/2020 1206   LDLCALC 52 08/18/2017 1024     Physical Exam:    VS:  BP (!) 132/58   Pulse 63   Ht 5\' 2"  (1.575 m)   Wt 169 lb (76.7 kg)   SpO2 96%   BMI 30.91 kg/m     Wt Readings from Last 3 Encounters:  12/11/20 169 lb (76.7 kg)  12/02/20 167 lb 3.2 oz (75.8 kg)  11/13/20 165 lb 12.8 oz (75.2 kg)     GEN:  Well nourished, well developed in no acute distress HEENT: Normal NECK: No JVD; No carotid bruits CARDIAC:  RRR, no murmurs, rubs, gallops RESPIRATORY:  Clear to auscultation without rales, wheezing or rhonchi  ABDOMEN: Soft, non-tender, non-distended MUSCULOSKELETAL:  No edema; No deformity. Warm. Palpable DP pulse. SKIN: Warm and dry NEUROLOGIC:  Alert and oriented x 3 PSYCHIATRIC:  Normal affect   ASSESSMENT:    1. Decreased pulses in feet   2. Screening for heart disease   3. Peripheral arterial disease ([place])   4. Type 2 diabetes mellitus with other specified complication, with long-term current use of insulin ([place])   5. Primary hypertension   6. Mixed hyperlipidemia    PLAN:    In order of problems listed above:  #Abnormal TBI: #PAD: Patient with abnormal TBI on left on LE ultrsound in 2020. ABIs unremarkable on both legs. Denies symptoms of claudication. Has occasional cramping at night but no symptoms with exertion. Extremities are warm with no ulcerations.  -Continue crestor 20mg  daily -Resume ASA 81mg  (can take every other day if bruising is a continued issue) -Discussed that exercise is important to prevent PAD from worsening -Continue aggressive management of DMII and HTN  #DMII: -Continue metformin, insulin and farxiga  #HTN: Well controlled. -Continue lisinopril 5mg  daily  #HLD: -Continue crestor 20mg  daily  Medication Adjustments/Labs and Tests Ordered: Current medicines are reviewed at length with the patient today.  Concerns regarding  medicines are outlined above.  Orders Placed This Encounter  Procedures  . EKG 12-Lead   Meds ordered this encounter  Medications  . aspirin EC 81 MG tablet    Sig: Take 1 tablet (81 mg total) by mouth every other day. Swallow whole.    Dispense:  45 tablet    Refill:  3    Patient Instructions  Medication Instructions:  Your physician has recommended you make the following change in your medication: Start aspirin 81 mg by mouth every other day  *If you need a refill on your cardiac medications before your next appointment, please call your pharmacy*   Lab Work: none If you have labs (blood work) drawn today and your tests are completely normal, you will receive your results only by: Marland Kitchen MyChart Message (if you have MyChart) OR . A paper copy in the mail If you have any lab test that is abnormal or we need to change your treatment, we will call you to review the results.   Testing/Procedures: none   Follow-Up: At [facility], you and your health needs are our priority.  As part of our continuing mission to provide you with exceptional heart care, we have created designated Provider Care Teams.  These Care Teams include your primary Cardiologist (physician) and Advanced Practice Providers (APPs -  Physician Assistants and Nurse Practitioners) who all work together to provide you with the care you need, when you need it.  We recommend signing up for the patient portal called "MyChart".  Sign up information is provided on this After Visit Summary.  MyChart is used to connect with patients for Virtual Visits (Telemedicine).  Patients are able to view lab/test results, encounter notes, upcoming appointments, etc.  Non-urgent messages can be sent to your provider as well.   To learn more about what you can do with MyChart, go to [email or web address].    Your next appointment:   6 month(s)  The format for your next appointment:   In Person  Provider:   You may see Freada Bergeron, MD or one of the following Advanced Practice Providers on your designated Care Team:    Nicki Reaper  Kathlen Mody, PA-C  Robbie Lis, PA-C    Other Instructions      Signed, Freada Bergeron, MD  12/11/2020 4:20 PM    [facility]

## 2020-12-08 NOTE — Telephone Encounter (Signed)
I spoke directly to patient. There is no pre-cert required for ultrasound, she understood. I also explained she may get a bill because it is a contracted rate between her and her insurance company, If they didn't cover it all it would be her responsibility she verbally understood.   [facility]

## 2020-12-09 DIAGNOSIS — Z1231 Encounter for screening mammogram for malignant neoplasm of breast: Secondary | ICD-10-CM | POA: Diagnosis not present

## 2020-12-09 DIAGNOSIS — M8589 Other specified disorders of bone density and structure, multiple sites: Secondary | ICD-10-CM | POA: Diagnosis not present

## 2020-12-09 DIAGNOSIS — Z780 Asymptomatic menopausal state: Secondary | ICD-10-CM | POA: Diagnosis not present

## 2020-12-09 LAB — HM MAMMOGRAPHY

## 2020-12-09 LAB — HM DEXA SCAN

## 2020-12-10 ENCOUNTER — Telehealth: Payer: Self-pay | Admitting: Medical

## 2020-12-10 NOTE — Telephone Encounter (Signed)
See mammogram note as well.  Bone density test shows osteopenia or low bone mass.   There has been some improvement in left hip but decrease bone density in right hip and spine since last visit.  So no major difference.  Since you used bisphosphonate medications like fosamax for about 5 years that we discontinued in mid 2020, I recommend the following:  Osteoporosis treatment recommendations:  Exercise regularly including aerobic and weight bearing exercise.   Weight-bearing physical activity and exercises that improve balance and posture can strengthen bones and reduce the chance of a fracture. The more active and fit you are as you age, the less likely you are to fall and break a bone.   Good nutrition. Eat a healthy diet and make certain that you're getting 1200mg  of calcium daily in the diet from dairy (typically 4 servings) or supplements OTC.   Also they should be getting Vitamin D through eating fish, seafood, getting sun exposure, and using either OTC Vitamin D supplement such as 1000-2000 IU daily unless instructed by [place] otherwise.   Summer 2022 will make 2 years off medicatoin.  I recommend trial of Prolia beginning summer 2022.  This is an injectable medication every 6 months to help reduce bone resorption to keep bones strong.  If agreeable we will see if we can get this approved.

## 2020-12-10 NOTE — Telephone Encounter (Signed)
I am happy to report that her mammogram was normal, no worrisome findings.

## 2020-12-11 ENCOUNTER — Ambulatory Visit: Payer: Medicare HMO | Admitting: Cardiology

## 2020-12-11 ENCOUNTER — Encounter: Payer: Self-pay | Admitting: Medical

## 2020-12-11 ENCOUNTER — Other Ambulatory Visit: Payer: Self-pay

## 2020-12-11 ENCOUNTER — Encounter: Payer: Self-pay | Admitting: Cardiology

## 2020-12-11 VITALS — BP 132/58 | HR 63 | Ht 62.0 in | Wt 169.0 lb

## 2020-12-11 DIAGNOSIS — Z136 Encounter for screening for cardiovascular disorders: Secondary | ICD-10-CM

## 2020-12-11 DIAGNOSIS — E1169 Type 2 diabetes mellitus with other specified complication: Secondary | ICD-10-CM | POA: Diagnosis not present

## 2020-12-11 DIAGNOSIS — I739 Peripheral vascular disease, unspecified: Secondary | ICD-10-CM | POA: Diagnosis not present

## 2020-12-11 DIAGNOSIS — E782 Mixed hyperlipidemia: Secondary | ICD-10-CM | POA: Diagnosis not present

## 2020-12-11 DIAGNOSIS — I10 Essential (primary) hypertension: Secondary | ICD-10-CM | POA: Diagnosis not present

## 2020-12-11 DIAGNOSIS — R0989 Other specified symptoms and signs involving the circulatory and respiratory systems: Secondary | ICD-10-CM

## 2020-12-11 DIAGNOSIS — Z794 Long term (current) use of insulin: Secondary | ICD-10-CM | POA: Diagnosis not present

## 2020-12-11 MED ORDER — ASPIRIN EC 81 MG PO TBEC: 81.0000 mg | DELAYED_RELEASE_TABLET | ORAL | 3 refills | Status: DC

## 2020-12-11 NOTE — Telephone Encounter (Signed)
Message sent to patient via mychart

## 2020-12-11 NOTE — Patient Instructions (Signed)
Medication Instructions:  Your physician has recommended you make the following change in your medication: Start aspirin 81 mg by mouth every other day  *If you need a refill on your cardiac medications before your next appointment, please call your pharmacy*   Lab Work: none If you have labs (blood work) drawn today and your tests are completely normal, you will receive your results only by: Marland Kitchen MyChart Message (if you have MyChart) OR . A paper copy in the mail If you have any lab test that is abnormal or we need to change your treatment, we will call you to review the results.   Testing/Procedures: none   Follow-Up: At [facility], you and your health needs are our priority.  As part of our continuing mission to provide you with exceptional heart care, we have created designated Provider Care Teams.  These Care Teams include your primary Cardiologist (physician) and Advanced Practice Providers (APPs -  Physician Assistants and Nurse Practitioners) who all work together to provide you with the care you need, when you need it.  We recommend signing up for the patient portal called "MyChart".  Sign up information is provided on this After Visit Summary.  MyChart is used to connect with patients for Virtual Visits (Telemedicine).  Patients are able to view lab/test results, encounter notes, upcoming appointments, etc.  Non-urgent messages can be sent to your provider as well.   To learn more about what you can do with MyChart, go to [email or web address].    Your next appointment:   6 month(s)  The format for your next appointment:   In Person  Provider:   You may see Freada Bergeron, MD or one of the following Advanced Practice Providers on your designated Care Team:    Richardson Dopp, PA-C  Robbie Lis, [place]    Other Instructions

## 2020-12-12 ENCOUNTER — Encounter: Payer: Self-pay | Admitting: Medical

## 2020-12-16 DIAGNOSIS — R350 Frequency of micturition: Secondary | ICD-10-CM | POA: Diagnosis not present

## 2020-12-16 DIAGNOSIS — N390 Urinary tract infection, site not specified: Secondary | ICD-10-CM | POA: Diagnosis not present

## 2020-12-16 NOTE — Telephone Encounter (Signed)
This is for Prolia so goes to [employer]

## 2020-12-17 ENCOUNTER — Other Ambulatory Visit: Payer: Self-pay

## 2020-12-17 DIAGNOSIS — K5904 Chronic idiopathic constipation: Secondary | ICD-10-CM | POA: Insufficient documentation

## 2020-12-17 DIAGNOSIS — K601 Chronic anal fissure: Secondary | ICD-10-CM | POA: Insufficient documentation

## 2020-12-17 DIAGNOSIS — K629 Disease of anus and rectum, unspecified: Secondary | ICD-10-CM | POA: Insufficient documentation

## 2020-12-17 DIAGNOSIS — E669 Obesity, unspecified: Secondary | ICD-10-CM | POA: Insufficient documentation

## 2020-12-17 DIAGNOSIS — K5730 Diverticulosis of large intestine without perforation or abscess without bleeding: Secondary | ICD-10-CM | POA: Insufficient documentation

## 2020-12-17 DIAGNOSIS — I739 Peripheral vascular disease, unspecified: Secondary | ICD-10-CM

## 2020-12-23 ENCOUNTER — Encounter: Payer: Self-pay | Admitting: Medical

## 2020-12-29 ENCOUNTER — Other Ambulatory Visit: Payer: Self-pay

## 2020-12-29 MED ORDER — BD PEN NEEDLE MINI U/F 31G X 5 MM MISC: 1 refills | Status: DC

## 2021-01-01 ENCOUNTER — Telehealth: Payer: Self-pay | Admitting: Gastroenterology

## 2021-01-01 NOTE — Telephone Encounter (Signed)
Hi Dr. Tarri Glenn,    Patient is requesting a transfer of care from Dr. Lorie Apley office to [facility]. Patient is seen for rectal pian\pressure.Operative\path reports are included from 2000, 2006 and 2011  Records were received for you to review and advise on scheduling.   Thank you

## 2021-01-01 NOTE — Telephone Encounter (Signed)
Review of records from Dr. Collene Mares.  Colonoscopy 03/24/1999 was normal except for diverticulosis and small hemorrhoids.  Colonoscopy with Dr. Collene Mares 04/23/2005 showed small internal hemorrhoids, sigmoid diverticulosis, and no polyps.  Colonoscopy 08/31/2010 which showed scattered diverticulosis, hyperplastic polyps from the rectosigmoid colon, and a descending colon tubular adenoma.  Records from 2017 show 1-2 bowel movements a day with no blood or mucus in the stool.  A Cologuard DNA test was recommended because the patient was 74.  Negative Cologuard 02/16/2016.  Seen for 2 to 3 weeks of rectal pain in 2017 with a sensation that there was a "ball in the rectum".  Treated for chronic anal fissure with RectiCare cream and sitz bath's.  Also treated for constipation with Linzess 290 mcg daily, IBgard, and FD guard.  Seen in 2019 for rectal pressure.  Exam revealed a large amount of stool in the rectal vault.  She was given samples of Linzess and told to use IBgard and FD guard.  Seen 12/27/2019 for rectal pain.  She was treated with Linzess for constipation.  [facility] SCHEDULING: Okay to schedule office consultation with me

## 2021-01-02 ENCOUNTER — Encounter: Payer: Self-pay | Admitting: Gastroenterology

## 2021-01-05 ENCOUNTER — Encounter: Payer: Self-pay | Admitting: Vascular Surgery

## 2021-01-05 ENCOUNTER — Ambulatory Visit: Payer: Medicare HMO | Admitting: Vascular Surgery

## 2021-01-05 ENCOUNTER — Ambulatory Visit (HOSPITAL_COMMUNITY)
Admission: RE | Admit: 2021-01-05 | Discharge: 2021-01-05 | Disposition: A | Discharge status: Home / Self Care | Payer: Medicare HMO | Source: Ambulatory Visit | Attending: Vascular Surgery | Admitting: Vascular Surgery

## 2021-01-05 ENCOUNTER — Other Ambulatory Visit: Payer: Self-pay

## 2021-01-05 VITALS — BP 134/72 | HR 66 | Temp 98.0°F | Resp 20 | Ht 62.0 in | Wt 165.0 lb

## 2021-01-05 DIAGNOSIS — I739 Peripheral vascular disease, unspecified: Secondary | ICD-10-CM | POA: Diagnosis not present

## 2021-01-05 NOTE — Progress Notes (Signed)
Patient ID: Marissa Oliver, female   DOB: 19-Jan-1941, 80 y.o.   MRN: [id number]  Reason for Consult: New Patient (Initial Visit)   Referred by Carlena Hurl, PA-C  Subjective:     HPI:  Marissa Oliver is a 80 y.o. female without significant vascular disease.  She has never had stroke, TIA or amaurosis.  She does have an aunt with history of aneurysm disease but no personal history of aneurysm.  She actually does not complain of any leg pain at this time.  She does complain of spider veins particularly her left lower extremity.  She denies any swelling.  She does not have tissue loss, or ulceration.  She is a former smoker quitting about 10 years ago.  She takes aspirin every other day and is also on Crestor.  Past Medical History:  Diagnosis Date  . Atrophic vaginitis    estrace cream rx'd by Dr. Karsten Ro  . Colon polyp    tubular adenoma (colonoscopy q5 yrs)  . Diabetes mellitus 2005  . Diverticulosis   . Dyslipidemia   . GAD (generalized anxiety disorder)   . Hemorrhoids   . OAB (overactive bladder)   . Osteoporosis   . Psoriasis    mild, sees [facility]  . Smoker    Family History  Problem Relation Age of Onset  . Arthritis Mother   . Heart failure Mother   . Mental illness Father   . Kidney disease Father   . Stroke Father   . Heart attack Father   . Heart disease Brother   . Cancer Brother        prostate  . Stroke Maternal Aunt   . Cancer Maternal Uncle   . Stroke Paternal Grandmother    Past Surgical History:  Procedure Laterality Date  . ABDOMINAL HYSTERECTOMY  1972   partial  . APPENDECTOMY    . CARDIOVASCULAR STRESS TEST  12/25/2004   EF 65%. OVERALL NORMAL STRESS CARDIOLITE. NO EVIDENCE  OF ISCHEMIA  . COLONOSCOPY  08/2010   cologuard 01/2016 normal  . PUBOVAGINAL SLING  04/1998  . TUBAL LIGATION      Short Social History:  Social History   Tobacco Use  . Smoking status: Former Smoker    Packs/day: 0.50    Quit date: 05/22/2012     Years since quitting: 8.6  . Smokeless tobacco: Never Used  Substance Use Topics  . Alcohol use: No    Alcohol/week: 0.0 standard drinks    No Known Allergies  Current Outpatient Medications  Medication Sig Dispense Refill  . aspirin EC 81 MG tablet Take 1 tablet (81 mg total) by mouth every other day. Swallow whole. 45 tablet 3  . betamethasone dipropionate 0.05 % cream Apply to areas twice    . FARXIGA 10 MG TABS tablet Take 1 tablet (10 mg total) by mouth daily. 90 tablet 3  . Insulin Glargine (BASAGLAR KWIKPEN) 100 UNIT/ML Inject 40 Units into the skin daily. 3 mL 1  . Insulin Pen Needle (B-D UF III MINI PEN NEEDLES) 31G X 5 MM MISC USE ONCE DAILY 100 each 1  . LINZESS 290 MCG CAPS capsule Take 290 mcg by mouth daily.     Marland Kitchen lisinopril (ZESTRIL) 5 MG tablet Take 1 tablet (5 mg total) by mouth daily. 90 tablet 0  . metFORMIN (GLUCOPHAGE) 500 MG tablet TAKE 1 TABLET BY MOUTH EVERY DAY WITH BREAKFAST 90 tablet 0  . ONETOUCH ULTRA test strip USE AS DIRECTED TO  TEST BLOOD SUGARS TWICE A DAY 200 strip 1  . oxybutynin (DITROPAN) 5 MG tablet Take 1 tablet (5 mg total) by mouth 2 (two) times daily. 180 tablet 0  . rosuvastatin (CRESTOR) 20 MG tablet TAKE 1 TABLET BY MOUTH EVERY DAY 90 tablet 0  . triamcinolone cream (KENALOG) 0.1 % APPLY ON THE SKIN TWICE A DAY AS NEEDED    . trimethoprim (TRIMPEX) 100 MG tablet Take 100 mg by mouth daily.     No current facility-administered medications for this visit.    Review of Systems  Constitutional:  Constitutional negative. HENT: HENT negative.  Eyes: Eyes negative.  Respiratory: Respiratory negative.  Cardiovascular: Cardiovascular negative.  GI: Gastrointestinal negative.  Musculoskeletal: Musculoskeletal negative.  Skin: Skin negative.  Neurological: Neurological negative. Hematologic: Hematologic/lymphatic negative.  Psychiatric: Psychiatric negative.        Objective:  Objective  Vitals:   01/05/21 1428  BP: 134/72  Pulse: 66   Resp: 20  Temp: 98 F (36.7 C)  SpO2: 96%    Physical Exam HENT:     Head: Normocephalic.     Nose:     Comments: Wearing a mask Eyes:     Pupils: Pupils are equal, round, and reactive to light.  Cardiovascular:     Rate and Rhythm: Normal rate.     Pulses:          Radial pulses are 2+ on the right side and 2+ on the left side.       Popliteal pulses are 2+ on the right side and 2+ on the left side.       Dorsalis pedis pulses are 2+ on the right side and 2+ on the left side.       Posterior tibial pulses are 2+ on the right side and 2+ on the left side.  Pulmonary:     Effort: Pulmonary effort is normal.  Abdominal:     General: Abdomen is flat.     Palpations: Abdomen is soft. There is no mass.  Musculoskeletal:        General: Normal range of motion.  Skin:    General: Skin is warm and dry.     Capillary Refill: Capillary refill takes less than 2 seconds.  Neurological:     General: No focal deficit present.     Mental Status: She is alert.  Psychiatric:        Mood and Affect: Mood normal.        Behavior: Behavior normal.        Thought Content: Thought content normal.        Judgment: Judgment normal.     Data: ABI Findings:  +---------+------------------+-----+---------+--------+  Right  Rt Pressure (mmHg)IndexWaveform Comment   +---------+------------------+-----+---------+--------+  Brachial 136                      +---------+------------------+-----+---------+--------+  PTA   159        1.17 triphasic      +---------+------------------+-----+---------+--------+  DP    158        1.16 biphasic       +---------+------------------+-----+---------+--------+  Great Toe95        0.70 Normal        +---------+------------------+-----+---------+--------+   +---------+------------------+-----+---------+-------+  Left   Lt Pressure (mmHg)IndexWaveform Comment   +---------+------------------+-----+---------+-------+  Brachial 123                      +---------+------------------+-----+---------+-------+  PTA  162        1.19 triphasic      +---------+------------------+-----+---------+-------+  DP    152        1.12 triphasic      +---------+------------------+-----+---------+-------+  Great Toe104        0.76 Normal        +---------+------------------+-----+---------+-------+   +-------+-----------+-----------+------------+------------+  ABI/TBIToday's ABIToday's TBIPrevious ABIPrevious TBI  +-------+-----------+-----------+------------+------------+  Right 1.17    0.70    1.31    0.81      +-------+-----------+-----------+------------+------------+  Left  1.19    0.76    1.19    0.56      +-------+-----------+-----------+------------+------------+    Summary:  Right: Resting right ankle-brachial index is within normal range. No  evidence of significant right lower extremity arterial disease. The right  toe-brachial index is normal.   Left: Resting left ankle-brachial index is within normal range. No  evidence of significant left lower extremity arterial disease. The left  toe-brachial index is normal.      Assessment/Plan:     80 year old female sent for evaluation of lower extremity vascular disease.  She has palpable pulses bilaterally.  She denies any symptoms related to vascular disease today.  She can follow-up with me on an as-needed basis.  She will continue aspirin every other day and statin as tolerated.     Waynetta Sandy MD [facility]

## 2021-01-15 ENCOUNTER — Other Ambulatory Visit: Payer: Self-pay | Admitting: Medical

## 2021-01-15 DIAGNOSIS — E118 Type 2 diabetes mellitus with unspecified complications: Secondary | ICD-10-CM

## 2021-01-19 ENCOUNTER — Telehealth: Payer: Self-pay

## 2021-01-19 NOTE — Telephone Encounter (Signed)
PT [facility] faxed to [facility] T# [phone]

## 2021-01-20 ENCOUNTER — Other Ambulatory Visit: Payer: Self-pay

## 2021-01-20 ENCOUNTER — Encounter: Payer: Self-pay | Admitting: Family Medicine

## 2021-01-20 ENCOUNTER — Ambulatory Visit (INDEPENDENT_AMBULATORY_CARE_PROVIDER_SITE_OTHER): Payer: Medicare HMO | Admitting: Family Medicine

## 2021-01-20 VITALS — BP 124/80 | HR 61 | Temp 97.2°F | Wt 168.8 lb

## 2021-01-20 DIAGNOSIS — R300 Dysuria: Secondary | ICD-10-CM | POA: Diagnosis not present

## 2021-01-20 DIAGNOSIS — Z87440 Personal history of urinary (tract) infections: Secondary | ICD-10-CM

## 2021-01-20 DIAGNOSIS — N3001 Acute cystitis with hematuria: Secondary | ICD-10-CM

## 2021-01-20 DIAGNOSIS — K5904 Chronic idiopathic constipation: Secondary | ICD-10-CM | POA: Diagnosis not present

## 2021-01-20 LAB — POCT URINALYSIS DIP (PROADVANTAGE DEVICE)
Bilirubin, UA: NEGATIVE
Glucose, UA: >=1000 mg/dL — AB
Ketones, POC UA: NEGATIVE mg/dL
Leukocytes, UA: NEGATIVE
Nitrite, UA: POSITIVE — AB
Protein Ur, POC: NEGATIVE mg/dL
Specific Gravity, Urine: 1.015
Urobilinogen, Ur: NEGATIVE
pH, UA: 6 (ref 5.0–8.0)

## 2021-01-20 MED ORDER — CIPROFLOXACIN HCL 250 MG PO TABS: 250.0000 mg | ORAL_TABLET | Freq: Two times a day (BID) | ORAL | 0 refills | Status: DC

## 2021-01-20 NOTE — Patient Instructions (Signed)
Take the antibiotic as prescribed.  We will be in touch with your urine culture results.  Let us know if you are getting worse or not back to baseline when you complete the antibiotic.

## 2021-01-20 NOTE — Progress Notes (Signed)
Subjective:  Marissa Oliver is a 80 y.o. female who complains of possible urinary tract infection.  She has had symptoms for 2 days.  Symptoms include urinary frequency, urgency and burning with urination. Patient denies fever, chills, abdominal pain, back pain, N/V/D.  Last UTI was in February 2022 and the urologist.   States she has not had a bowel movement in 2 days and has not taken her Linzess. Chronic constipation history.    Patient does have a history of recurrent UTI. Patient does not have a history of pyelonephritis.  No other aggravating or relieving factors.  No other c/o.  Past Medical History:  Diagnosis Date  . Atrophic vaginitis    estrace cream rx'd by Dr. Karsten Ro  . Colon polyp    tubular adenoma (colonoscopy q5 yrs)  . Diabetes mellitus 2005  . Diverticulosis   . Dyslipidemia   . GAD (generalized anxiety disorder)   . Hemorrhoids   . OAB (overactive bladder)   . Osteoporosis   . Psoriasis    mild, sees [facility]  . Smoker     ROS as in subjective  Reviewed allergies, medications, past medical, surgical, and social history.    Objective: Vitals:   01/20/21 0915  BP: 124/80  Pulse: 61  Temp: (!) 97.2 F (36.2 C)    General appearance: alert, no distress, WD/WN, female Abdomen: +bs, soft, non tender, non distended, no masses Back: no CVA tenderness GU: declines      Laboratory:  Urine dipstick: trace blood, +nit, neg leuks.       Assessment: Acute cystitis with hematuria - Plan: Urine Microscopic, Urine Culture, ciprofloxacin (CIPRO) 250 MG tablet  Burning with urination - Plan: POCT Urinalysis DIP (Proadvantage Device)  History of recurrent UTIs - Plan: Urine Microscopic, Urine Culture  Chronic idiopathic constipation  Dysuria    Plan: Discussed symptoms, diagnosis, possible complications, and usual course of illness.  Reviewed urologist notes from last month. She is on prophylactic antibiotic. Will stop while on Cipro.  Recommend probiotic.  Advised increased water intake, can use OTC Tylenol for pain.     Recommend taking her Linzess as prescribed and that sometimes constipation can increase urinary symptoms.     Urine culture sent.   Discussed patient with her PCP Audelia Acton.   Call or return if worse or not improving.

## 2021-01-21 LAB — URINALYSIS, MICROSCOPIC ONLY: Casts: NONE SEEN /lpf

## 2021-01-22 ENCOUNTER — Other Ambulatory Visit: Payer: Self-pay

## 2021-01-22 ENCOUNTER — Encounter: Payer: Self-pay | Admitting: Family Medicine

## 2021-01-22 DIAGNOSIS — E118 Type 2 diabetes mellitus with unspecified complications: Secondary | ICD-10-CM

## 2021-01-22 MED ORDER — LISINOPRIL 5 MG PO TABS: 5.0000 mg | ORAL_TABLET | Freq: Every day | ORAL | 3 refills | Status: DC

## 2021-01-22 NOTE — Telephone Encounter (Signed)
Called pt and answered questions and advised Wilder Glade was faxed 01/19/21

## 2021-01-23 LAB — URINE CULTURE

## 2021-01-23 NOTE — Progress Notes (Signed)
Please make sure she is aware that the antibiotic she is on is the right one for her infection. Is she feeling better?

## 2021-01-27 NOTE — Telephone Encounter (Signed)
This was faxed to [employer]

## 2021-01-29 ENCOUNTER — Other Ambulatory Visit: Payer: Self-pay

## 2021-01-29 ENCOUNTER — Other Ambulatory Visit: Payer: Self-pay | Admitting: Medical

## 2021-01-29 ENCOUNTER — Ambulatory Visit (INDEPENDENT_AMBULATORY_CARE_PROVIDER_SITE_OTHER): Payer: Medicare HMO | Admitting: Family Medicine

## 2021-01-29 ENCOUNTER — Encounter: Payer: Self-pay | Admitting: Family Medicine

## 2021-01-29 VITALS — BP 110/68 | HR 63 | Temp 97.8°F | Wt 169.8 lb

## 2021-01-29 DIAGNOSIS — N3001 Acute cystitis with hematuria: Secondary | ICD-10-CM | POA: Diagnosis not present

## 2021-01-29 DIAGNOSIS — E1169 Type 2 diabetes mellitus with other specified complication: Secondary | ICD-10-CM

## 2021-01-29 DIAGNOSIS — Z794 Long term (current) use of insulin: Secondary | ICD-10-CM

## 2021-01-29 DIAGNOSIS — Z87440 Personal history of urinary (tract) infections: Secondary | ICD-10-CM | POA: Diagnosis not present

## 2021-01-29 LAB — POCT URINALYSIS DIP (PROADVANTAGE DEVICE)
Bilirubin, UA: NEGATIVE
Glucose, UA: NEGATIVE mg/dL
Ketones, POC UA: NEGATIVE mg/dL
Nitrite, UA: NEGATIVE
Protein Ur, POC: NEGATIVE mg/dL
Specific Gravity, Urine: 1.02
Urobilinogen, Ur: NEGATIVE
pH, UA: 6 (ref 5.0–8.0)

## 2021-01-29 MED ORDER — SULFAMETHOXAZOLE-TRIMETHOPRIM 800-160 MG PO TABS: 1.0000 | ORAL_TABLET | Freq: Two times a day (BID) | ORAL | 0 refills | Status: DC

## 2021-01-29 NOTE — Patient Instructions (Signed)
Please call your urologist to schedule a visit.   I will treat you with another round of antibiotic and stop the preventive one.   Buy a probiotic over the counter such as Align, [place], etc We need to replace the good bacteria in your colon and gut to prevent C-diff as we discussed.

## 2021-01-29 NOTE — Progress Notes (Signed)
Subjective:  Marissa Oliver is a 80 y.o. female who complains of possible urinary tract infection.  She has had symptoms for 2 days.  Symptoms include urinary frequency, urgency and suprapubic pressure. Patient denies Fever, chills, chest pain, palpitation, shortness of breath, abdominal pain, back pain, nausea, vomiting, diarrhea or constipation..  Last UTI was on 01/20/2021   Using nothing for current symptoms.    Patient does have a history of recurrent UTI. Patient does not have a history of pyelonephritis.  No other aggravating or relieving factors.  No other c/o.  Past Medical History:  Diagnosis Date  . Atrophic vaginitis    estrace cream rx'd by Dr. Karsten Ro  . Colon polyp    tubular adenoma (colonoscopy q5 yrs)  . Diabetes mellitus 2005  . Diverticulosis   . Dyslipidemia   . GAD (generalized anxiety disorder)   . Hemorrhoids   . OAB (overactive bladder)   . Osteoporosis   . Psoriasis    mild, sees [facility]  . Smoker     ROS as in subjective   Reviewed allergies, medications, past medical, surgical, and social history.    Objective: Vitals:   01/29/21 1347  BP: 110/68  Pulse: 63  Temp: 97.8 F (36.6 C)    General appearance: alert, no distress, WD/WN, female Abdomen: +bs, soft, non tender, non distended Back: no CVA tenderness GU: declines      Laboratory:  Urine dipstick: +blood, leuks.       Assessment: Acute cystitis with hematuria - Plan: sulfamethoxazole-trimethoprim (BACTRIM DS) 800-160 MG tablet  History of recurrent UTIs    Plan: Discussed symptoms, diagnosis, possible complications, and usual course of illness.    Established Patient Office Visit  Subjective:  Patient ID: Marissa Oliver, female    DOB: September 22, 1941  Age: 80 y.o. MRN: [id number]  CC:  Chief Complaint  Patient presents with  . Urinary Tract Infection    Possible UTI. Having pain yesterday. Some burning with urination    HPI Marissa Oliver presents for    Past Medical History:  Diagnosis Date  . Atrophic vaginitis    estrace cream rx'd by Dr. Karsten Ro  . Colon polyp    tubular adenoma (colonoscopy q5 yrs)  . Diabetes mellitus 2005  . Diverticulosis   . Dyslipidemia   . GAD (generalized anxiety disorder)   . Hemorrhoids   . OAB (overactive bladder)   . Osteoporosis   . Psoriasis    mild, sees [facility]  . Smoker     Past Surgical History:  Procedure Laterality Date  . ABDOMINAL HYSTERECTOMY  1972   partial  . APPENDECTOMY    . CARDIOVASCULAR STRESS TEST  12/25/2004   EF 65%. OVERALL NORMAL STRESS CARDIOLITE. NO EVIDENCE  OF ISCHEMIA  . COLONOSCOPY  08/2010   cologuard 01/2016 normal  . PUBOVAGINAL SLING  04/1998  . TUBAL LIGATION      Family History  Problem Relation Age of Onset  . Arthritis Mother   . Heart failure Mother   . Mental illness Father   . Kidney disease Father   . Stroke Father   . Heart attack Father   . Heart disease Brother   . Cancer Brother        prostate  . Stroke Maternal Aunt   . Cancer Maternal Uncle   . Stroke Paternal Grandmother     Social History   Socioeconomic History  . Marital status: Divorced    Spouse name: Not on file  .  Number of children: Not on file  . Years of education: Not on file  . Highest education level: Not on file  Occupational History  . Not on file  Tobacco Use  . Smoking status: Former Smoker    Packs/day: 0.50    Quit date: 05/22/2012    Years since quitting: 8.6  . Smokeless tobacco: Never Used  Vaping Use  . Vaping Use: Never used  Substance and Sexual Activity  . Alcohol use: No    Alcohol/week: 0.0 standard drinks  . Drug use: No  . Sexual activity: Not Currently  Other Topics Concern  . 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  Has 3 children.  Exercises some with walking.  Per 08/2018   Social Determinants of Health   Financial Resource Strain: Not on file  Food  Insecurity: Not on file  Transportation Needs: Not on file  Physical Activity: Not on file  Stress: Not on file  Social Connections: Not on file  Intimate Partner Violence: Not on file    Outpatient Medications Prior to Visit  Medication Sig Dispense Refill  . aspirin EC 81 MG tablet Take 1 tablet (81 mg total) by mouth every other day. Swallow whole. 45 tablet 3  . FARXIGA 10 MG TABS tablet Take 1 tablet (10 mg total) by mouth daily. 90 tablet 3  . Insulin Glargine (BASAGLAR KWIKPEN) 100 UNIT/ML Inject 40 Units into the skin daily. 3 mL 1  . Insulin Pen Needle (B-D UF III MINI PEN NEEDLES) 31G X 5 MM MISC USE ONCE DAILY 100 each 1  . LINZESS 290 MCG CAPS capsule Take 290 mcg by mouth daily.     Marland Kitchen lisinopril (ZESTRIL) 5 MG tablet Take 1 tablet (5 mg total) by mouth daily. 90 tablet 3  . metFORMIN (GLUCOPHAGE) 500 MG tablet TAKE 1 TABLET BY MOUTH  DAILY WITH BREAKFAST (Patient taking differently: TAKE 1 TABLET BY MOUTH  DAILY WITH BREAKFAST) 90 tablet 3  . ONETOUCH ULTRA test strip USE AS DIRECTED TO TEST BLOOD SUGARS TWICE A DAY 200 strip 1  . oxybutynin (DITROPAN) 5 MG tablet TAKE 1 TABLET BY MOUTH  TWICE DAILY 180 tablet 3  . rosuvastatin (CRESTOR) 20 MG tablet TAKE 1 TABLET BY MOUTH  DAILY 90 tablet 3  . triamcinolone cream (KENALOG) 0.1 % APPLY ON THE SKIN TWICE A DAY AS NEEDED    . trimethoprim (TRIMPEX) 100 MG tablet Take 100 mg by mouth 2 (two) times daily.    . ciprofloxacin (CIPRO) 250 MG tablet Take 1 tablet (250 mg total) by mouth 2 (two) times daily. (Patient not taking: Reported on 01/29/2021) 6 tablet 0   No facility-administered medications prior to visit.    No Known Allergies  ROS Review of Systems    Objective:    Physical Exam  BP 110/68   Pulse 63   Temp 97.8 F (36.6 C)   Wt 169 lb 12.8 oz (77 kg)   BMI 31.06 kg/m  Wt Readings from Last 3 Encounters:  01/29/21 169 lb 12.8 oz (77 kg)  01/20/21 168 lb 12.8 oz (76.6 kg)  01/05/21 165 lb (74.8 kg)      Health Maintenance Due  Topic Date Due  . COVID-19 Vaccine (3 - Booster for Pfizer series) 07/04/2020    There are no preventive care reminders to display for this patient.  Lab Results  Component Value Date   TSH 1.930 12/19/2018  Lab Results  Component Value Date   WBC 5.8 12/02/2020   HGB 14.4 12/02/2020   HCT 42.0 12/02/2020   MCV 86 12/02/2020   PLT 154 12/02/2020   Lab Results  Component Value Date   NA 139 12/02/2020   K 4.2 12/02/2020   CO2 21 12/02/2020   GLUCOSE 124 (H) 12/02/2020   BUN 22 12/02/2020   CREATININE 0.74 12/02/2020   BILITOT 0.3 12/02/2020   ALKPHOS 98 12/02/2020   AST 12 12/02/2020   ALT 18 12/02/2020   PROT 6.5 12/02/2020   ALBUMIN 4.6 12/02/2020   CALCIUM 9.8 12/02/2020   Lab Results  Component Value Date   CHOL 135 07/09/2020   Lab Results  Component Value Date   HDL 56 07/09/2020   Lab Results  Component Value Date   LDLCALC 58 07/09/2020   Lab Results  Component Value Date   TRIG 118 07/09/2020   Lab Results  Component Value Date   CHOLHDL 2.4 07/09/2020   Lab Results  Component Value Date   HGBA1C 7.5 (H) 12/02/2020      Assessment & Plan:   Problem List Items Addressed This Visit      Other   History of recurrent UTIs    Other Visit Diagnoses    Acute cystitis with hematuria    -  Primary   Relevant Medications   sulfamethoxazole-trimethoprim (BACTRIM DS) 800-160 MG tablet      Meds ordered this encounter  Medications  . sulfamethoxazole-trimethoprim (BACTRIM DS) 800-160 MG tablet    Sig: Take 1 tablet by mouth 2 (two) times daily.    Dispense:  10 tablet    Refill:  0    Order Specific Question:   Supervising Provider    Answer:   Denita Lung [[id number]]    Follow-up: No follow-ups on file.    Harland Dingwall, NP-C   Antibiotic prescribed.  She previously took Cipro.  Counseling on the fact that she needs to take a probiotic while she is on this antibiotic.  Discussed the risk of C.  difficile due to regular antibiotic use.  Advised increased water intake, can use OTC Tylenol for pain.    Advised that she needs to stop the prophylactic antibiotic while taking the antibiotic for her acute infection.  I also advised her that she needs to call her urologist and schedule an appointment for further evaluation.  Urine culture was not sent today since I sent a culture on 01/20/2021.  Her culture was sensitive to Cipro, Macrobid and Bactrim.  Call or return if worse or not improving.

## 2021-01-29 NOTE — Addendum Note (Signed)
Addended by: Minette Headland A on: 01/29/2021 04:51 PM   Modules accepted: Orders

## 2021-02-04 ENCOUNTER — Encounter: Payer: Self-pay | Admitting: Family Medicine

## 2021-02-17 ENCOUNTER — Other Ambulatory Visit: Payer: Self-pay

## 2021-02-17 ENCOUNTER — Ambulatory Visit (INDEPENDENT_AMBULATORY_CARE_PROVIDER_SITE_OTHER): Payer: Medicare HMO | Admitting: Gastroenterology

## 2021-02-17 ENCOUNTER — Other Ambulatory Visit: Payer: Self-pay | Admitting: Medical

## 2021-02-17 ENCOUNTER — Encounter: Payer: Self-pay | Admitting: Gastroenterology

## 2021-02-17 VITALS — BP 120/78 | HR 55 | Ht 62.0 in | Wt 168.0 lb

## 2021-02-17 DIAGNOSIS — K5900 Constipation, unspecified: Secondary | ICD-10-CM | POA: Diagnosis not present

## 2021-02-17 MED ORDER — LINZESS 290 MCG PO CAPS: 290.0000 ug | ORAL_CAPSULE | Freq: Every day | ORAL | 5 refills | Status: DC

## 2021-02-17 MED ORDER — POLYETHYLENE GLYCOL 3350 17 GM/SCOOP PO POWD: ORAL | 3 refills | Status: DC

## 2021-02-17 NOTE — Progress Notes (Signed)
Referring Provider: Carlena Hurl, PA-C Primary Care Physician:  Carlena Hurl, PA-C  Reason for Consultation:  Constipation, rectal pain   IMPRESSION:  Chronic constipation    -previously evaluated and treated by Dr. Collene Mares    -symptoms controlled on Linzess but it is cost prohibitive History of colon polyps   Discussed option to repeat colonoscopy. However, she does not want to proceed with any further endoscopic evaluation.    PLAN: - Refill Linzess 290 mcg daily (samples provided today) - Trial of Miralax 17g daily - Low threshold to obtain CT scan with any alarm features - Follow-up in 6 months, earlier as needed  Please see the "Patient Instructions" section for addition details about the plan.  HPI: Marissa Oliver is a 80 y.o. female self-referred for a refill of her Linzess.  She has previously been followed by Dr. Collene Mares and by Dr. Velora Heckler many years ago. She reports a history of colon polyps diagnosed by Dr. Velora Heckler at age 28 when she presented with rectal bleeding. Records are not available in EPIC. Has more recently been following by Dr. Collene Mares.  Has a history of anxiety, depression, diabetes, urinary tract infections, and IBS  Review of records from Dr. Collene Mares revealed:. - Colonoscopy 03/24/1999 was normal except for diverticulosis and small hemorrhoids. - Colonoscopy with Dr. Collene Mares 04/23/2005 showed small internal hemorrhoids, sigmoid diverticulosis, and no polyps. - Colonoscopy 08/31/2010 which showed scattered diverticulosis, hyperplastic polyps from the rectosigmoid colon, and a descending colon tubular adenoma. - Records from 2017 show 1-2 bowel movements a day with no blood or mucus in the stool.  A Cologuard DNA test was recommended because the patient was 74.  Negative Cologuard 02/16/2016. - Seen for 2 to 3 weeks of rectal pain in 2017 with a sensation that there was a "ball in the rectum". Treated for chronic anal fissure with RectiCare cream and sitz bath's.   Also treated for constipation with Linzess 290 mcg daily, IBgard, and FD guard. - Seen in 2019 for rectal pressure.  Exam revealed a large amount of stool in the rectal vault.  She was given samples of Linzess and told to use IBgard and FD guard. - Seen 12/27/2019 for rectal pain.  She was treated with Linzess for constipation.   Presents today looking to establish care and to request a refill of her Linzess. She has been taking Linzess 290 mcg daily since that time with relief of constipation. However, the Linzess costs her $100 month. She would like to consider alternatives.   She has ongoing constipation. She has a bowel movement every 2-3 days using Linzess. No straining. Sense of complete evacuation.  There is no blood or mucus in the stool.  She has had multiple GI symptoms over the years but denies any additional ongoing concerns at this time.  Prior treatments for constipation include: - Metamucil most days - Linzess 290 mcg daily, but she finds this cost prohibitive - no other OTC or prescription medical therapies  Labs 12/19/18: normal TSH Labs 12/02/20: normal calcium 9.8, normal CBC  No recent abdominal imaging  Past Medical History:  Diagnosis Date  . Atrophic vaginitis    estrace cream rx'd by Dr. Karsten Ro  . Colon polyp    tubular adenoma (colonoscopy q5 yrs)  . Diabetes mellitus 2005  . Diverticulosis   . Dyslipidemia   . GAD (generalized anxiety disorder)   . Hemorrhoids   . OAB (overactive bladder)   . Osteoporosis   . Psoriasis  mild, sees [facility]  . Smoker     Past Surgical History:  Procedure Laterality Date  . ABDOMINAL HYSTERECTOMY  1972   partial  . APPENDECTOMY    . CARDIOVASCULAR STRESS TEST  12/25/2004   EF 65%. OVERALL NORMAL STRESS CARDIOLITE. NO EVIDENCE  OF ISCHEMIA  . COLONOSCOPY  08/2010   cologuard 01/2016 normal  . PUBOVAGINAL SLING  04/1998  . TUBAL LIGATION      Current Outpatient Medications  Medication Sig Dispense  Refill  . aspirin EC 81 MG tablet Take 1 tablet (81 mg total) by mouth every other day. Swallow whole. 45 tablet 3  . FARXIGA 10 MG TABS tablet Take 1 tablet (10 mg total) by mouth daily. 90 tablet 3  . Insulin Glargine (BASAGLAR KWIKPEN) 100 UNIT/ML Inject 40 Units into the skin daily. 3 mL 1  . Insulin Pen Needle (B-D UF III MINI PEN NEEDLES) 31G X 5 MM MISC USE ONCE DAILY 100 each 1  . lisinopril (ZESTRIL) 5 MG tablet Take 1 tablet (5 mg total) by mouth daily. 90 tablet 3  . metFORMIN (GLUCOPHAGE) 500 MG tablet TAKE 1 TABLET BY MOUTH  DAILY WITH BREAKFAST (Patient taking differently: TAKE 1 TABLET BY MOUTH  DAILY WITH BREAKFAST) 90 tablet 3  . ONETOUCH ULTRA test strip USE AS DIRECTED TO TEST BLOOD SUGARS TWICE A DAY 200 strip 1  . oxybutynin (DITROPAN) 5 MG tablet TAKE 1 TABLET BY MOUTH  TWICE DAILY 180 tablet 3  . polyethylene glycol powder (GLYCOLAX/MIRALAX) 17 GM/SCOOP powder Mix 1 capful in 8 oz of water or juice daily 255 g 3  . rosuvastatin (CRESTOR) 20 MG tablet TAKE 1 TABLET BY MOUTH  DAILY 90 tablet 3  . sulfamethoxazole-trimethoprim (BACTRIM DS) 800-160 MG tablet Take 1 tablet by mouth 2 (two) times daily. 10 tablet 0  . triamcinolone cream (KENALOG) 0.1 % APPLY ON THE SKIN TWICE A DAY AS NEEDED    . trimethoprim (TRIMPEX) 100 MG tablet Take 100 mg by mouth 2 (two) times daily.    Marland Kitchen LINZESS 290 MCG CAPS capsule Take 1 capsule (290 mcg total) by mouth daily. 30 capsule 5   No current facility-administered medications for this visit.    Allergies as of 02/17/2021  . (No Known Allergies)    Family History  Problem Relation Age of Onset  . Arthritis Mother   . Heart failure Mother   . Mental illness Father   . Kidney disease Father   . Stroke Father   . Heart attack Father   . Heart disease Brother   . Cancer Brother        prostate  . Stroke Maternal Aunt   . Cancer Maternal Uncle   . Stroke Paternal Grandmother      Review of Systems: 12 system ROS is negative  except as noted above with the addition of anxiety, blood in the urine, depression, and rash.   Physical Exam: General:   Alert,  well-nourished, pleasant and cooperative in NAD, she appears her stated age Head:  Normocephalic and atraumatic. Eyes:  Sclera clear, no icterus.   Conjunctiva pink. Ears:  Normal auditory acuity. Nose:  No deformity, discharge,  or lesions. Mouth:  No deformity or lesions.   Neck:  Supple; no masses or thyromegaly. Lungs:  Clear throughout to auscultation.   No wheezes. Heart:  Regular rate and rhythm; no murmurs. Abdomen:  Soft, nontender, nondistended, normal bowel sounds, no rebound or guarding. No hepatosplenomegaly.   Rectal:  Deferred  Msk:  Symmetrical. No boney deformities LAD: No inguinal or umbilical LAD Extremities:  No clubbing or edema. Neurologic:  Alert and  oriented x4;  grossly nonfocal Skin:  Intact without significant lesions or rashes. Psych:  Alert and cooperative. Normal mood and affect.     Lehman Whiteley L. Tarri Glenn, MD, MPH 02/17/2021, 5:00 PM

## 2021-02-17 NOTE — Patient Instructions (Addendum)
It was a pleasure to meet you today. Based on our discussion, I am providing you with my recommendations below:  RECOMMENDATION(S):   I would like for you to trial Miralax. Below are the instructions I would like for you to follow. I will also provide you with a coupon to help of set your out of pocket expense.  I will send in a refill to your pharmacy for [place]. I will provide you with samples today.  You may continue to use the Linzess if you need it in addition to the Miralax.   I would like to see you back in 6 months. Please call me if you need anything prior to that visit.  PRESCRIPTION MEDICATION(S):   We have sent the following medication(s) to your pharmacy:  . Rolan Lipa - please take 1 tablet daily  NOTE: If your medication(s) requires a PRIOR AUTHORIZATION, we will receive notification from your pharmacy. Once received, the process to submit for approval may take up to 7-10 business days. You will be contacted about any denials we have received from your insurance company as well as alternatives recommended by your provider.  OVER THE COUNTER MEDICATION(S):   Please purchase the following medications over the counter and take as directed:  Marland Kitchen Miralax - please mix 1 capful in 8 ounces of juice or water daily. You may use the generic version of Miralax if it is available for a cheaper price.   FOLLOW UP:  . I would like for you to follow up with me in 6 months. Please call the office at [phone] to schedule your appointment.  BMI:  . If you are age 82 or older, your body mass index should be between 23-30. Your Body mass index is 30.73 kg/m. If this is out of the aforementioned range listed, please consider follow up with your Primary Care Provider.  Thank you for trusting me with your gastrointestinal care!    Thornton Park, MD, MPH

## 2021-02-19 ENCOUNTER — Telehealth: Payer: Self-pay

## 2021-02-19 NOTE — Telephone Encounter (Signed)
Printed out PT ASSISTANCE BASAGLAR application

## 2021-02-23 ENCOUNTER — Encounter: Payer: Self-pay | Admitting: Gastroenterology

## 2021-03-04 ENCOUNTER — Other Ambulatory Visit: Payer: Self-pay

## 2021-03-04 ENCOUNTER — Encounter: Payer: Self-pay | Admitting: Medical

## 2021-03-04 ENCOUNTER — Telehealth (INDEPENDENT_AMBULATORY_CARE_PROVIDER_SITE_OTHER): Payer: Medicare HMO | Admitting: Medical

## 2021-03-04 VITALS — BP 113/50 | HR 65 | Temp 98.1°F | Ht 62.0 in | Wt 168.0 lb

## 2021-03-04 DIAGNOSIS — R52 Pain, unspecified: Secondary | ICD-10-CM

## 2021-03-04 DIAGNOSIS — M79605 Pain in left leg: Secondary | ICD-10-CM | POA: Diagnosis not present

## 2021-03-04 DIAGNOSIS — M79604 Pain in right leg: Secondary | ICD-10-CM | POA: Diagnosis not present

## 2021-03-04 DIAGNOSIS — R059 Cough, unspecified: Secondary | ICD-10-CM

## 2021-03-04 DIAGNOSIS — R509 Fever, unspecified: Secondary | ICD-10-CM

## 2021-03-04 NOTE — Progress Notes (Signed)
Subjective:     Patient ID: Marissa Oliver, female   DOB: 1941-07-16, 80 y.o.   MRN: [id number]  This visit type was conducted due to national recommendations for restrictions regarding the COVID-19 Pandemic (e.g. social distancing) in an effort to limit this patient's exposure and mitigate transmission in our community.  Due to their co-morbid illnesses, this patient is at least at moderate risk for complications without adequate follow up.  This format is felt to be most appropriate for this patient at this time.    Documentation for virtual audio and video telecommunications through [facility] encounter:  The patient was located at home. The provider was located in the office. The patient did consent to this visit and is aware of possible charges through their insurance for this visit.  The other persons participating in this telemedicine service were none. Time spent on call was 20 minutes and in review of previous records 20 minutes total.  This virtual service is not related to other E/M service within previous 7 days.    HPI Chief Complaint  Patient presents with  . Fever    PHONE CALL fever, leg pain and runny nose that started Monday. Feeling reallt bad. Fever Monday was 100.0.   Virtual consult for acute illness.    She reports 4 day hx/o fever around 100, runny nose, legs ache, some body aches.  No energy.  Has had some chills.  No nausea, no vomiting, no diarrhea.   Has a little cough.   No sore throat, no sneezing.  No urinary frequency or urgency.  No blood in urine.  No loss of smell or taste.   No recent covid test.  Using getting some cramps in legs.  Using [employer] brand pain reliever.  Both legs hurts in general, no leg swelling, no particular calf swelling.  No SOB or wheezing.  Feels flushed.  No other aggravating or relieving factors. No other complaint.   Past Medical History:  Diagnosis Date  . Atrophic vaginitis    estrace cream rx'd by Dr. Karsten Ro  . Colon  polyp    tubular adenoma (colonoscopy q5 yrs)  . Diabetes mellitus 2005  . Diverticulosis   . Dyslipidemia   . GAD (generalized anxiety disorder)   . Hemorrhoids   . OAB (overactive bladder)   . Osteoporosis   . Psoriasis    mild, sees [facility]  . Smoker    Current Outpatient Medications on File Prior to Visit  Medication Sig Dispense Refill  . aspirin EC 81 MG tablet Take 1 tablet (81 mg total) by mouth every other day. Swallow whole. 45 tablet 3  . Insulin Pen Needle (B-D UF III MINI PEN NEEDLES) 31G X 5 MM MISC USE ONCE DAILY 100 each 1  . LEVEMIR FLEXTOUCH 100 UNIT/ML FlexPen SMARTSIG:40 Unit(s) SUB-Q Daily    . LINZESS 290 MCG CAPS capsule Take 1 capsule (290 mcg total) by mouth daily. 30 capsule 5  . lisinopril (ZESTRIL) 5 MG tablet Take 1 tablet (5 mg total) by mouth daily. 90 tablet 3  . metFORMIN (GLUCOPHAGE) 500 MG tablet TAKE 1 TABLET BY MOUTH  DAILY WITH BREAKFAST (Patient taking differently: TAKE 1 TABLET BY MOUTH  DAILY WITH BREAKFAST) 90 tablet 3  . ONETOUCH ULTRA test strip USE AS DIRECTED TO TEST BLOOD SUGARS TWICE A DAY 200 strip 1  . oxybutynin (DITROPAN) 5 MG tablet TAKE 1 TABLET BY MOUTH  TWICE DAILY 180 tablet 3  . polyethylene glycol powder (GLYCOLAX/MIRALAX)  17 GM/SCOOP powder Mix 1 capful in 8 oz of water or juice daily 255 g 3  . rosuvastatin (CRESTOR) 20 MG tablet TAKE 1 TABLET BY MOUTH  DAILY 90 tablet 3  . FARXIGA 10 MG TABS tablet Take 1 tablet (10 mg total) by mouth daily. (Patient not taking: Reported on 03/04/2021) 90 tablet 3  . Insulin Glargine (BASAGLAR KWIKPEN) 100 UNIT/ML Inject 40 Units into the skin daily. (Patient not taking: Reported on 03/04/2021) 3 mL 1  . triamcinolone cream (KENALOG) 0.1 % APPLY ON THE SKIN TWICE A DAY AS NEEDED (Patient not taking: Reported on 03/04/2021)    . trimethoprim (TRIMPEX) 100 MG tablet Take 100 mg by mouth 2 (two) times daily. (Patient not taking: Reported on 03/04/2021)     No current facility-administered  medications on file prior to visit.     Review of Systems As in subjective    Objective:   Physical Exam Due to coronavirus pandemic stay at home measures, patient visit was virtual and they were not examined in person.   BP (!) 113/50 Comment: no cuff  Pulse 65   Temp 98.1 F (36.7 C) (Oral)   Ht 5\' 2"  (1.575 m)   Wt 168 lb (76.2 kg)   BMI 30.73 kg/m     BP Readings from Last 3 Encounters:  03/04/21 (!) 113/50  02/17/21 120/78  01/29/21 110/68   Gen: wd, wn ,nad No dyspnea, no labored breathing, answers questions in complete sentences       Assessment:     Encounter Diagnoses  Name Primary?  . Fever, unspecified fever cause Yes  . Body aches   . Cough   . Pain in both lower extremities        Plan:     Discussed symptoms, possible causes.  Discussed limitations of virtual consult.   advised hydration, rest, Tylenol every 4-6 hours for aches and pains.  Without tachycardia or edema in the legs or pain in the calves, less likely DVT.  I suspect a viral syndrome.  I recommended she come in for testing for COVID and flu.  She cannot come today as they are paving her street.  She will come tomorrow morning for testing  If worse or not improving in the next few days, call, recheck or go to the emergency dept.   Zeffie was seen today for fever.  Diagnoses and all orders for this visit:  Fever, unspecified fever cause  Body aches  Cough  Pain in both lower extremities  f/u tomorrow morning for testing here in our back parking lot

## 2021-03-05 ENCOUNTER — Other Ambulatory Visit: Payer: Medicare HMO

## 2021-03-05 ENCOUNTER — Other Ambulatory Visit: Payer: Self-pay | Admitting: Medical

## 2021-03-05 ENCOUNTER — Telehealth: Payer: Self-pay | Admitting: Medical

## 2021-03-05 LAB — POC COVID19 BINAXNOW: SARS Coronavirus 2 Ag: POSITIVE — AB

## 2021-03-05 LAB — POCT INFLUENZA A/B
Influenza A, POC: NEGATIVE
Influenza B, POC: NEGATIVE

## 2021-03-05 MED ORDER — PAXLOVID 20 X 150 MG & 10 X 100MG PO TBPK: 1.0000 | ORAL_TABLET | Freq: Two times a day (BID) | ORAL | 0 refills | Status: DC

## 2021-03-05 NOTE — Telephone Encounter (Signed)
Your recent coronavirus/Covid 19 test result was indeed positive!    Begin Paxlovid which is a specific covid treatment.  I sent this to pharmacy.     General recommendations: I recommend you rest, hydrate well with water and clear fluids throughout the day.   You can use Tylenol for pain or fever You can use over the counter Delsym for cough. You can use over the counter Emetrol for nausea.     If you are having trouble breathing, if you are very weak, have high fever 103 or higher consistently despite Tylenol, or uncontrollable nausea and vomiting, then call or go to the emergency department.    Covid symptoms such as fatigue and cough can linger over 2 weeks, even after the initial fever, aches, chills, and other initial symptoms.   Self Quarantine: The CDC, Centers for Disease Control has recommended a self quarantine of 5 days from the start of your illness until you are symptom-free including at least 24 hours of no symptoms including no fever, no shortness of breath, and no body aches and chills, by day 5 before returning to work or general contact with the public.  What does self quarantine mean: avoiding contact with people as much as possible.   Particularly in your house, isolate your self from others in a separate room, wear a mask when possible in the room, particularly if coughing a lot.   Have others bring food, water, medications, etc., to your door, but avoid direct contact with your household contacts during this time to avoid spreading the infection to them.   If you have a separate bathroom and living quarters during the next 2 weeks away from others, that would be preferable.    If you can't completely isolate, then wear a mask, wash hands frequently with soap and water for at least 15 seconds, minimize close contact with others, and have a friend or family member check regularly from a distance to make sure you are not getting seriously worse.     You should not be going  out in public, should not be going to stores, to work or other public places until all your symptoms have resolved and at least 5 days + 24 hours of no symptoms at all have transpired.   Ideally you should avoid contact with others for a full 5 days if possible.  One of the goals is to limit spread to high risk people; people that are older and elderly, people with multiple health issues like diabetes, heart disease, lung disease, and anybody that has weakened immune systems such as people with cancer or on immunosuppressive therapy.

## 2021-03-05 NOTE — Telephone Encounter (Signed)
Message has been sent to patient via mychart  

## 2021-03-05 NOTE — Telephone Encounter (Signed)
Please call patient,  I want to make sure this got handled since Genera had to leave

## 2021-03-10 ENCOUNTER — Telehealth: Payer: Self-pay

## 2021-03-10 NOTE — Telephone Encounter (Signed)
Pt Assistance application LEVEMIR completed by pt and provider and faxed

## 2021-03-10 NOTE — Telephone Encounter (Signed)
Pt Assistance application completed by pt and provider and faxed, she also stated she prefers Levemir that she was on before so will also try to get her approved for that instead

## 2021-03-11 ENCOUNTER — Other Ambulatory Visit: Payer: Self-pay | Admitting: Medical

## 2021-03-11 MED ORDER — BENZONATATE 100 MG PO CAPS: 100.0000 mg | ORAL_CAPSULE | Freq: Four times a day (QID) | ORAL | 0 refills | Status: DC | PRN

## 2021-03-19 NOTE — Telephone Encounter (Signed)
Recv'd letter from [employer] that pt is approved til 09/30/21, left message with pt's brother Luvenia Heller, he said pt had gotten letter also

## 2021-03-19 NOTE — Telephone Encounter (Signed)
Brother Luvenia Heller said patient received Basaglar at home the other day.  I advised him to tell her to use that until we get the Levemir.

## 2021-04-07 ENCOUNTER — Telehealth: Payer: Self-pay

## 2021-04-07 NOTE — Telephone Encounter (Signed)
I reviewed the message.  Is she referring to her brother Hewitt Shorts?   Certainly give my condolences but I cant recall if she had other siblings.

## 2021-04-07 NOTE — Telephone Encounter (Signed)
Pt was approved for Levemir and [employer] she will discuss with you at [facility] next week which she is going to stay on.  Also she wanted to let you know that her other brother just passed away & please remember her family.  Sympathy card send.

## 2021-04-07 NOTE — Telephone Encounter (Signed)
Pt Assistance Levemir received & needles, called pt and informed.  Asked which she wanted to be on the Levemir or the [place], since she was approved for both.  She will discuss with Audelia Acton next week at her appt. Advised again she was not to be on both medications.

## 2021-04-10 NOTE — Telephone Encounter (Signed)
done

## 2021-04-15 ENCOUNTER — Other Ambulatory Visit: Payer: Self-pay | Admitting: Medical

## 2021-04-15 ENCOUNTER — Encounter: Payer: Self-pay | Admitting: Medical

## 2021-04-15 ENCOUNTER — Ambulatory Visit (INDEPENDENT_AMBULATORY_CARE_PROVIDER_SITE_OTHER): Payer: Medicare HMO | Admitting: Medical

## 2021-04-15 ENCOUNTER — Telehealth: Payer: Self-pay

## 2021-04-15 VITALS — BP 120/62 | HR 68 | Ht 62.0 in | Wt 168.4 lb

## 2021-04-15 DIAGNOSIS — N3281 Overactive bladder: Secondary | ICD-10-CM

## 2021-04-15 DIAGNOSIS — K5904 Chronic idiopathic constipation: Secondary | ICD-10-CM

## 2021-04-15 DIAGNOSIS — K601 Chronic anal fissure: Secondary | ICD-10-CM

## 2021-04-15 DIAGNOSIS — Z87440 Personal history of urinary (tract) infections: Secondary | ICD-10-CM | POA: Diagnosis not present

## 2021-04-15 DIAGNOSIS — G4700 Insomnia, unspecified: Secondary | ICD-10-CM | POA: Diagnosis not present

## 2021-04-15 DIAGNOSIS — E1169 Type 2 diabetes mellitus with other specified complication: Secondary | ICD-10-CM | POA: Diagnosis not present

## 2021-04-15 DIAGNOSIS — F411 Generalized anxiety disorder: Secondary | ICD-10-CM

## 2021-04-15 DIAGNOSIS — Z86010 Personal history of colonic polyps: Secondary | ICD-10-CM

## 2021-04-15 DIAGNOSIS — M810 Age-related osteoporosis without current pathological fracture: Secondary | ICD-10-CM

## 2021-04-15 DIAGNOSIS — K5730 Diverticulosis of large intestine without perforation or abscess without bleeding: Secondary | ICD-10-CM

## 2021-04-15 DIAGNOSIS — N952 Postmenopausal atrophic vaginitis: Secondary | ICD-10-CM

## 2021-04-15 DIAGNOSIS — Z7185 Encounter for immunization safety counseling: Secondary | ICD-10-CM

## 2021-04-15 DIAGNOSIS — R3129 Other microscopic hematuria: Secondary | ICD-10-CM

## 2021-04-15 DIAGNOSIS — L409 Psoriasis, unspecified: Secondary | ICD-10-CM

## 2021-04-15 DIAGNOSIS — Z7189 Other specified counseling: Secondary | ICD-10-CM

## 2021-04-15 DIAGNOSIS — Z87891 Personal history of nicotine dependence: Secondary | ICD-10-CM

## 2021-04-15 DIAGNOSIS — E559 Vitamin D deficiency, unspecified: Secondary | ICD-10-CM

## 2021-04-15 DIAGNOSIS — I739 Peripheral vascular disease, unspecified: Secondary | ICD-10-CM | POA: Diagnosis not present

## 2021-04-15 DIAGNOSIS — R32 Unspecified urinary incontinence: Secondary | ICD-10-CM

## 2021-04-15 DIAGNOSIS — E2839 Other primary ovarian failure: Secondary | ICD-10-CM | POA: Diagnosis not present

## 2021-04-15 DIAGNOSIS — H9190 Unspecified hearing loss, unspecified ear: Secondary | ICD-10-CM | POA: Diagnosis not present

## 2021-04-15 DIAGNOSIS — R69 Illness, unspecified: Secondary | ICD-10-CM | POA: Diagnosis not present

## 2021-04-15 DIAGNOSIS — Z794 Long term (current) use of insulin: Secondary | ICD-10-CM

## 2021-04-15 DIAGNOSIS — Z0000 Encounter for general adult medical examination without abnormal findings: Secondary | ICD-10-CM

## 2021-04-15 DIAGNOSIS — E785 Hyperlipidemia, unspecified: Secondary | ICD-10-CM

## 2021-04-15 DIAGNOSIS — D692 Other nonthrombocytopenic purpura: Secondary | ICD-10-CM

## 2021-04-15 DIAGNOSIS — Z780 Asymptomatic menopausal state: Secondary | ICD-10-CM

## 2021-04-15 NOTE — Telephone Encounter (Signed)
Patient called to inform that she is using [facility] for her medications. She stated she wants to have her Shingrix vaccine done at [facility]. Please write script as patient will come by tomorrow to pick it up.

## 2021-04-15 NOTE — Progress Notes (Addendum)
Subjective:    Marissa Oliver is a 80 y.o. female who presents for Preventative Services visit and chronic medical problems/med check visit.    Primary Care Provider Tysinger, Camelia Eng, PA-C here for primary care  Current Health Care Team: Dentist, N/A Eye doctor, Dr. Manus Rudd, [facility] Lennie Odor, [place] with [facility] Dermat[facility] Dr. Thornton Park, prior with Dr. Juanita Craver, GI Dr. Servando Snare, vascular surgery Dr. Gwyndolyn Kaufman, cardiology   Medical Services you may have received from other than [facility] providers in the past year (date may be approximate) None  Exercise Current exercise habits: The patient does not participate in regular exercise at present.   Nutrition/Diet Current diet: in general, an "unhealthy" diet  Depression Screen Depression screen [facility] 2/9 04/15/2021  Decreased Interest 0  Down, Depressed, Hopeless 1  PHQ - 2 Score 1  Altered sleeping 0  Tired, decreased energy 0  Change in appetite 0  Feeling bad or failure about yourself  0  Trouble concentrating 0  Moving slowly or fidgety/restless 0  Suicidal thoughts 0  PHQ-9 Score 1  Difficult doing work/chores Not difficult at all  Some recent data might be hidden    Activities of Daily Living Screen/Functional Status Survey Is the patient deaf or have difficulty hearing?: Yes Does the patient have difficulty seeing, even when wearing glasses/contacts?: No Does the patient have difficulty concentrating, remembering, or making decisions?: No Does the patient have difficulty walking or climbing stairs?: No Does the patient have difficulty dressing or bathing?: No Does the patient have difficulty doing errands alone such as visiting a doctor's office or shopping?: No  Can patient draw a clock face showing 3:15 oclock, yes  Fall Risk Screen Fall Risk  04/15/2021 04/07/2020 03/03/2018 02/16/2017 04/29/2016  Falls in the past year? 0 0 Yes No No  Number falls in  past yr: 0 - 1 - -  Injury with Fall? 0 - Yes - -  Risk for fall due to : No Fall Risks - - - -  Follow up Falls evaluation completed - - - -    Gait Assessment: Normal gait observed yes  Advanced directives Does patient have a [place]? yes Does patient have a Living Will? yes  Past Medical History:  Diagnosis Date   Atrophic vaginitis    estrace cream rx'd by Dr. Karsten Ro   Colon polyp    tubular adenoma (colonoscopy q5 yrs)   Diabetes mellitus 2005   Diverticulosis    Dyslipidemia    GAD (generalized anxiety disorder)    Hemorrhoids    OAB (overactive bladder)    Osteoporosis    Psoriasis    mild, sees [facility]   Smoker     Past Surgical History:  Procedure Laterality Date   ABDOMINAL HYSTERECTOMY  1972   partial   APPENDECTOMY     CARDIOVASCULAR STRESS TEST  12/25/2004   EF 65%. OVERALL NORMAL STRESS CARDIOLITE. NO EVIDENCE  OF ISCHEMIA   COLONOSCOPY  08/2010   cologuard 01/2016 normal   PUBOVAGINAL SLING  04/1998   TUBAL LIGATION      Social History   Socioeconomic History   Marital status: Divorced    Spouse name: Not on file   Number of children: Not on file   Years of education: Not on file   Highest education level: Not on file  Occupational History   Not on file  Tobacco Use   Smoking status: Former  Packs/day: 0.50    Pack years: 0.00    Types: Cigarettes    Quit date: 05/22/2012    Years since quitting: 8.9   Smokeless tobacco: Never  Vaping Use   Vaping Use: Never used  Substance and Sexual Activity   Alcohol use: No    Alcohol/week: 0.0 standard drinks   Drug use: No   Sexual activity: Not Currently  Other Topics Concern   Not on file  Social History Narrative   Divorced, 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  Has 3 children.  Exercises some with walking.  Per 08/2018   Social Determinants of Health   Financial Resource Strain: Not on file  Food Insecurity: Not on file  Transportation  Needs: Not on file  Physical Activity: Not on file  Stress: Not on file  Social Connections: Not on file  Intimate Partner Violence: Not on file    Family History  Problem Relation Age of Onset   Arthritis Mother    Heart failure Mother    Mental illness Father    Kidney disease Father    Stroke Father    Heart attack Father    Heart disease Brother    Cancer Brother        prostate   Stroke Maternal Aunt    Cancer Maternal Uncle    Stroke Paternal Grandmother      Current Outpatient Medications:    aspirin EC 81 MG tablet, Take 1 tablet (81 mg total) by mouth every other day. Swallow whole., Disp: 45 tablet, Rfl: 3   FARXIGA 10 MG TABS tablet, Take 1 tablet (10 mg total) by mouth daily., Disp: 90 tablet, Rfl: 3   Insulin Glargine (BASAGLAR KWIKPEN) 100 UNIT/ML, Inject 40 Units into the skin daily., Disp: 3 mL, Rfl: 1   Insulin Pen Needle (B-D UF III MINI PEN NEEDLES) 31G X 5 MM MISC, USE ONCE DAILY, Disp: 100 each, Rfl: 1   LINZESS 290 MCG CAPS capsule, Take 1 capsule (290 mcg total) by mouth daily., Disp: 30 capsule, Rfl: 5   lisinopril (ZESTRIL) 5 MG tablet, Take 1 tablet (5 mg total) by mouth daily., Disp: 90 tablet, Rfl: 3   metFORMIN (GLUCOPHAGE) 500 MG tablet, TAKE 1 TABLET BY MOUTH  DAILY WITH BREAKFAST (Patient taking differently: TAKE 1 TABLET BY MOUTH  DAILY WITH BREAKFAST), Disp: 90 tablet, Rfl: 3   ONETOUCH ULTRA test strip, USE AS DIRECTED TO TEST BLOOD SUGARS TWICE A DAY, Disp: 200 strip, Rfl: 1   oxybutynin (DITROPAN) 5 MG tablet, TAKE 1 TABLET BY MOUTH  TWICE DAILY, Disp: 180 tablet, Rfl: 3   rosuvastatin (CRESTOR) 20 MG tablet, TAKE 1 TABLET BY MOUTH  DAILY, Disp: 90 tablet, Rfl: 3  No Known Allergies  History reviewed: allergies, current medications, past family history, past medical history, past social history, past surgical history and problem list  Chronic issues discussed: Diabetes - recent fasting readings range 80-145  Hyperlipidemia - compliant  with medication  No other recent concerns.  Compliant with rest of medications as usual.   Acute issues discussed: None other than brother just passed away and other brother is in hospital.  She has 2 brother still alive, 2 passed.  Objective:      Biometrics BP 120/62   Pulse 68   Ht 5\' 2"  (1.575 m)   Wt 168 lb 6.4 oz (76.4 kg)   SpO2 95%   BMI 30.80 kg/m   Cognitive Testing  Alert? Yes  Normal  Appearance?Yes  Oriented to person? Yes  Place? Yes   Time? Yes  Recall of three objects?  Yes  Can perform simple calculations? Yes  Displays appropriate judgment?Yes  Can read the correct time from a watch face?Yes  General appearance: alert, no distress, WD/WN, white female  Nutritional Status: Inadequate calore intake? no Loss of muscle mass? no Loss of fat beneath skin? no Localized or general edema? no Diminished functional status? no  Other pertinent exam: HEENT: normocephalic, sclerae anicteric, TMs pearly, nares patent, no discharge or erythema, pharynx normal Neck: supple, no lymphadenopathy, no thyromegaly, no masses, no bruits Heart: RRR, normal S1, S2, no murmurs Lungs: CTA bilaterally, no wheezes, rhonchi, or rales Abdomen: +bs, soft, non tender, non distended, no masses, no hepatomegaly, no splenomegaly Musculoskeletal: nontender, no swelling, no obvious deformity Extremities: no edema, no cyanosis, no clubbing Pulses: 2+ symmetric, upper and extremities, normal cap refill Neurological: alert, oriented x 3, CN2-12 intact, strength normal upper extremities and lower extremities, sensation normal throughout, DTRs 2+ throughout, no cerebellar signs, gait normal Psychiatric: normal affect, behavior normal, pleasant   Diabetic Foot Exam - Simple   Simple Foot Form Diabetic Foot exam was performed with the following findings: Yes 04/15/2021  9:35 AM  Visual Inspection No deformities, no ulcerations, no other skin breakdown bilaterally: Yes Sensation  Testing Intact to touch and monofilament testing bilaterally: Yes Pulse Check See comments: Yes Comments 1+ pedal pulses        Assessment:   Encounter Diagnoses  Name Primary?   Encounter for health maintenance examination in adult Yes   Medicare annual wellness visit, subsequent    Insomnia, unspecified type    Atrophic vaginitis    Chronic anal fissure    Chronic idiopathic constipation    Diverticulosis of colon    Dyslipidemia associated with type 2 diabetes mellitus ([place])    Estrogen deficiency    Former smoker    Generalized anxiety disorder    Hearing loss, unspecified hearing loss type, unspecified laterality    History of colonic polyps    History of recurrent UTIs    Osteoporosis, unspecified osteoporosis type, unspecified pathological fracture presence    Overactive bladder    Post-menopausal    Psoriasis    Senile purpura ([place])    Vaccine counseling    Vitamin D deficiency    Urinary incontinence, unspecified type    Type 2 diabetes mellitus with other specified complication, with long-term current use of insulin ([place])    Microscopic hematuria    Advanced directives, counseling/discussion    PAD (peripheral artery disease) ([place])     This visit was a preventative care visit, also known as wellness visit or routine physical.   Topics typically include healthy lifestyle, diet, exercise, preventative care, vaccinations, sick and well care, proper use of emergency dept and after hours care, as well as other concerns.     Recommendations: Continue to return yearly for your annual wellness and preventative care visits.  This gives [place] a chance to discuss healthy lifestyle, exercise, vaccinations, review your chart record, and perform screenings where appropriate.  I recommend you see your eye doctor yearly for routine vision care.  I recommend you see your dentist yearly for routine dental care including hygiene visits twice yearly.   Vaccination  recommendations were reviewed Immunization History  Administered Date(s) Administered   Fluad Quad(high Dose 65+) 07/09/2020   Influenza Split 08/12/2011, 08/15/2012, 07/15/2014   Influenza Whole 07/29/2008, 08/14/2009, 12/23/2010   Influenza, High Dose Seasonal  PF 08/28/2013, 08/04/2015, 08/22/2018   Influenza,inj,Quad PF,6+ Mos 10/13/2016   Influenza-Unspecified 06/03/2019   PFIZER(Purple Top)SARS-COV-2 Vaccination 12/08/2019, 01/02/2020   Pneumococcal Conjugate-13 01/30/2014   Pneumococcal Polysaccharide-23 12/23/2010, 12/02/2020   Tdap 03/01/2012    Shingles vaccine:  I recommend you have a shingles vaccine to help prevent shingles or herpes zoster outbreak.   Please call your insurer to inquire about coverage for the Shingrix vaccine given in 2 doses.   Some insurers cover this vaccine after age 20, some cover this after age 65.  If your insurer covers this, then call to schedule appointment to have this vaccine here.  Get a flu shot yearly in the fall  You will be due to tetanus 2023   Screening for cancer: Colon cancer screening: [place] preventative services task force does not recommend colon cancer screening beyond age 82 for normal risk individuals  Breast cancer screening:  [place] preventative services task force does not recommend mammograms beyond a 74 for normal risk individuals  Cervical cancer screening: You no longer need Pap smears   Skin cancer screening: Check your skin regularly for new changes, growing lesions, or other lesions of concern Come in for evaluation if you have skin lesions of concern.  We currently don't have screenings for other cancers besides breast, cervical, colon, and lung cancers.  If you have a strong family history of cancer or have other cancer screening concerns, please let me know.    Bone health: Get at least 150 minutes of aerobic exercise weekly Get weight bearing exercise at least once weekly Bone  density test:  A bone density test is an imaging test that uses a type of X-ray to measure the amount of calcium and other minerals in your bones. The test may be used to diagnose or screen you for a condition that causes weak or thin bones (osteoporosis), predict your risk for a broken bone (fracture), or determine how well your osteoporosis treatment is working. The bone density test is recommended for females 43 and older, or females or males <85 if certain risk factors such as thyroid disease, long term use of steroids such as for asthma or rheumatological issues, vitamin D deficiency, estrogen deficiency, family history of osteoporosis, self or family history of fragility fracture in first degree relative.  I reviewed your February 2022 bone density scan.  We repeat these every 2 years Abnormal scan, decreased in density from prior scan.  We tried to get Prolia covered by insurance but was unsuccessful.  I had recommended referral to rheumatology and you have not made a decision on this yet.  We need to come up with a plan on how you want to proceed.  Continue calcium and vitamin D supplementation.   Heart health: Get at least 150 minutes of aerobic exercise weekly Limit alcohol It is important to maintain a healthy blood pressure and healthy cholesterol numbers  Heart disease screening: Screening for heart disease includes screening for blood pressure, fasting lipids, glucose/diabetes screening, BMI height to weight ratio, reviewed of smoking status, physical activity, and diet.    Goals include blood pressure 120/80 or less, maintaining a healthy lipid/cholesterol profile, preventing diabetes or keeping diabetes numbers under good control, not smoking or using tobacco products, exercising most days per week or at least 150 minutes per week of exercise, and eating healthy variety of fruits and vegetables, healthy oils, and avoiding unhealthy food choices like fried food, fast food, high sugar  and high cholesterol  foods.    Other tests may possibly include EKG test, CT coronary calcium score, echocardiogram, exercise treadmill stress test.   You recently had an EKG done through cardiology in February 2022  You had a normal ABI blood flow screen in your legs in March 2020       Chronic problems discussed today: Hyperlipidemia associated with diabetes - continue Crestor daily in the evening  Diabetes-continue current medications, return for labs as planned  Urinary incontinence, overactive bladder-continue oxybutynin  Continue lisinopril for kidney protection  Chronic constipation-continue Linzess, good water and fiber intake  PAD - continue statin, aspirin, need to exercise   Acute problems discussed today: none  Recommendations: I recommend a yearly ophthalmology/optometry visit for glaucoma screening and eye checkup I recommended a yearly dental visit for hygiene and checkup Advanced directives - discussed nature and purpose of Advanced Directives, encouraged them to complete them if they have not done so and/or encouraged them to get [place] a copy if they have done this already.  Return for fasting labs as planned within the next 7 days    Vaniyah was seen today for medicare wellness.  Diagnoses and all orders for this visit:  Encounter for health maintenance examination in adult -     Lipid panel; Future -     Hemoglobin A1c; Future -     Urinalysis, microscopic only; Future -     Comprehensive metabolic panel; Future  Medicare annual wellness visit, subsequent  Insomnia, unspecified type  Atrophic vaginitis  Chronic anal fissure  Chronic idiopathic constipation  Diverticulosis of colon  Dyslipidemia associated with type 2 diabetes mellitus ([place]) -     Lipid panel; Future  Estrogen deficiency  Former smoker  Generalized anxiety disorder  Hearing loss, unspecified hearing loss type, unspecified laterality  History of colonic  polyps  History of recurrent UTIs  Osteoporosis, unspecified osteoporosis type, unspecified pathological fracture presence  Overactive bladder  Post-menopausal  Psoriasis  Senile purpura ([place])  Vaccine counseling  Vitamin D deficiency  Urinary incontinence, unspecified type  Type 2 diabetes mellitus with other specified complication, with long-term current use of insulin ([place]) -     Hemoglobin A1c; Future -     Comprehensive metabolic panel; Future  Microscopic hematuria -     Urinalysis, microscopic only; Future  Advanced directives, counseling/discussion  PAD (peripheral artery disease) ([place])   Medicare Attestation A preventative services visit was completed today.  During the course of the visit the patient was educated and counseled about appropriate screening and preventive services.  A health risk assessment was established with the patient that included a review of current medications, allergies, social history, family history, medical and preventative health history, biometrics, and preventative screenings to identify potential safety concerns or impairments.  A personalized plan was printed today for the patient's records and use.   Personalized health advice and education was given today to reduce health risks and promote self management and wellness.  Information regarding end of life planning was discussed today.  Dorothea Ogle, PA-C   04/15/2021

## 2021-04-15 NOTE — Patient Instructions (Signed)
This visit was a preventative care visit, also known as wellness visit or routine physical.   Topics typically include healthy lifestyle, diet, exercise, preventative care, vaccinations, sick and well care, proper use of emergency dept and after hours care, as well as other concerns.     Recommendations: Continue to return yearly for your annual wellness and preventative care visits.  This gives [place] a chance to discuss healthy lifestyle, exercise, vaccinations, review your chart record, and perform screenings where appropriate.  I recommend you see your eye doctor yearly for routine vision care.  I recommend you see your dentist yearly for routine dental care including hygiene visits twice yearly.   Vaccination recommendations were reviewed Immunization History  Administered Date(s) Administered   Fluad Quad(high Dose 65+) 07/09/2020   Influenza Split 08/12/2011, 08/15/2012, 07/15/2014   Influenza Whole 07/29/2008, 08/14/2009, 12/23/2010   Influenza, High Dose Seasonal PF 08/28/2013, 08/04/2015, 08/22/2018   Influenza,inj,Quad PF,6+ Mos 10/13/2016   Influenza-Unspecified 06/03/2019   PFIZER(Purple Top)SARS-COV-2 Vaccination 12/08/2019, 01/02/2020   Pneumococcal Conjugate-13 01/30/2014   Pneumococcal Polysaccharide-23 12/23/2010, 12/02/2020   Tdap 03/01/2012    Shingles vaccine:  I recommend you have a shingles vaccine to help prevent shingles or herpes zoster outbreak.   Please call your insurer to inquire about coverage for the Shingrix vaccine given in 2 doses.   Some insurers cover this vaccine after age 27, some cover this after age 89.  If your insurer covers this, then call to schedule appointment to have this vaccine here.  Get a flu shot yearly in the fall  You will be due to tetanus 2023   Screening for cancer: Colon cancer screening: [place] preventative services task force does not recommend colon cancer screening beyond age 55 for normal risk  individuals  Breast cancer screening:  [place] preventative services task force does not recommend mammograms beyond a 74 for normal risk individuals  Cervical cancer screening: You no longer need Pap smears   Skin cancer screening: Check your skin regularly for new changes, growing lesions, or other lesions of concern Come in for evaluation if you have skin lesions of concern.  We currently don't have screenings for other cancers besides breast, cervical, colon, and lung cancers.  If you have a strong family history of cancer or have other cancer screening concerns, please let me know.    Bone health: Get at least 150 minutes of aerobic exercise weekly Get weight bearing exercise at least once weekly Bone density test:  A bone density test is an imaging test that uses a type of X-ray to measure the amount of calcium and other minerals in your bones. The test may be used to diagnose or screen you for a condition that causes weak or thin bones (osteoporosis), predict your risk for a broken bone (fracture), or determine how well your osteoporosis treatment is working. The bone density test is recommended for females 36 and older, or females or males <83 if certain risk factors such as thyroid disease, long term use of steroids such as for asthma or rheumatological issues, vitamin D deficiency, estrogen deficiency, family history of osteoporosis, self or family history of fragility fracture in first degree relative.  I reviewed your February 2022 bone density scan.  We repeat these every 2 years    Heart health: Get at least 150 minutes of aerobic exercise weekly Limit alcohol It is important to maintain a healthy blood pressure and healthy cholesterol numbers  Heart disease screening: Screening  for heart disease includes screening for blood pressure, fasting lipids, glucose/diabetes screening, BMI height to weight ratio, reviewed of smoking status, physical activity, and  diet.    Goals include blood pressure 120/80 or less, maintaining a healthy lipid/cholesterol profile, preventing diabetes or keeping diabetes numbers under good control, not smoking or using tobacco products, exercising most days per week or at least 150 minutes per week of exercise, and eating healthy variety of fruits and vegetables, healthy oils, and avoiding unhealthy food choices like fried food, fast food, high sugar and high cholesterol foods.    Other tests may possibly include EKG test, CT coronary calcium score, echocardiogram, exercise treadmill stress test.   You recently had an EKG done through cardiology in February 2022  You had a normal ABI blood flow screen in your legs in March 2020       Chronic problems discussed today: Cholesterol-continue Crestor daily in the evening  Diabetes-continue current medications, return for labs as planned  Urinary incontinence, overactive bladder-continue oxybutynin  Continue lisinopril for kidney protection  Chronic constipation-continue MiraLAX powder 1 capful daily for prevention or Linzess, whichever works better for you    Acute problems discussed today: none  Recommendations: I recommend a yearly ophthalmology/optometry visit for glaucoma screening and eye checkup I recommended a yearly dental visit for hygiene and checkup Advanced directives - discussed nature and purpose of Advanced Directives, encouraged them to complete them if they have not done so and/or encouraged them to get [place] a copy if they have done this already.  Return for fasting labs as planned within the next 7 days

## 2021-04-15 NOTE — Progress Notes (Deleted)
Patient Care Team: Bellamy Rubey, Camelia Eng, PA-C as PCP - General (Family Medicine) Freada Bergeron, MD as PCP - Cardiology (Cardiology) Bjorn Loser, MD as Consulting Physician (Urology) Eye doctor, Dr. Luberta Mutter, [facility] Lennie Odor, [place] with [facility] Sees dentist   Assessment and Plan :  No diagnosis found.   This visit was a preventative care visit, also known as wellness visit or routine physical.   Topics typically include healthy lifestyle, diet, exercise, preventative care, vaccinations, sick and well care, proper use of emergency dept and after hours care, as well as other concerns.     Recommendations: Continue to return yearly for your annual wellness and preventative care visits.  This gives [place] a chance to discuss healthy lifestyle, exercise, vaccinations, review your chart record, and perform screenings where appropriate.  I recommend you see your eye doctor yearly for routine vision care.  I recommend you see your dentist yearly for routine dental care including hygiene visits twice yearly.   Vaccination recommendations were reviewed Immunization History  Administered Date(s) Administered   Fluad Quad(high Dose 65+) 07/09/2020   Influenza Split 08/12/2011, 08/15/2012, 07/15/2014   Influenza Whole 07/29/2008, 08/14/2009, 12/23/2010   Influenza, High Dose Seasonal PF 08/28/2013, 08/04/2015, 08/22/2018   Influenza,inj,Quad PF,6+ Mos 10/13/2016   Influenza-Unspecified 06/03/2019   PFIZER(Purple Top)SARS-COV-2 Vaccination 12/08/2019, 01/02/2020   Pneumococcal Conjugate-13 01/30/2014   Pneumococcal Polysaccharide-23 12/23/2010, 12/02/2020   Tdap 03/01/2012    I recommend a yearly flu shot in the fall  Your tetanus will be due next year 2023  Shingles vaccine:  I recommend you have a shingles vaccine to help prevent shingles or herpes zoster outbreak.   Please call your insurer to inquire about coverage for the Shingrix  vaccine given in 2 doses.   Some insurers cover this vaccine after age 62, some cover this after age 60.  If your insurer covers this, then call to schedule appointment to have this vaccine here.   Screening for cancer: Colon cancer screening: The [employer] generally does not recommend colon cancer screening after 82 years old   Breast cancer screening: The [place] generally does not recommend mammograms after 82 years old   You no longer need pap smear testing   Skin cancer screening: Check your skin regularly for new changes, growing lesions, or other lesions of concern Come in for evaluation if you have skin lesions of concern.  We currently don't have screenings for other cancers besides breast, cervical, colon, and lung cancers.  If you have a strong family history of cancer or have other cancer screening concerns, please let me know.    Bone health: Get at least 150 minutes of aerobic exercise weekly Get weight bearing exercise at least once weekly Bone density test:  A bone density test is an imaging test that uses a type of X-ray to measure the amount of calcium and other minerals in your bones. The test may be used to diagnose or screen you for a condition that causes weak or thin bones (osteoporosis), predict your risk for a broken bone (fracture), or determine how well your osteoporosis treatment is working. The bone density test is recommended for females 42 and older, or females or males <84 if certain risk factors such as thyroid disease, long term use of steroids such as for asthma or rheumatological issues, vitamin D deficiency, estrogen deficiency, family history of osteoporosis, self or family history of fragility fracture in first  degree relative.    Heart health: Get at least 150 minutes of aerobic exercise weekly Limit alcohol It is important to maintain a healthy blood pressure and  healthy cholesterol numbers  Heart disease screening: Screening for heart disease includes screening for blood pressure, fasting lipids, glucose/diabetes screening, BMI height to weight ratio, reviewed of smoking status, physical activity, and diet.    Goals include blood pressure 120/80 or less, maintaining a healthy lipid/cholesterol profile, preventing diabetes or keeping diabetes numbers under good control, not smoking or using tobacco products, exercising most days per week or at least 150 minutes per week of exercise, and eating healthy variety of fruits and vegetables, healthy oils, and avoiding unhealthy food choices like fried food, fast food, high sugar and high cholesterol foods.    Other tests may possibly include EKG test, CT coronary calcium score, echocardiogram, exercise treadmill stress test.  exam findings or tobacco history, I recommend AAA screening.   Medical care options: I recommend you continue to seek care here first for routine care.  We try really hard to have available appointments Monday through Friday daytime hours for sick visits, acute visits, and physicals.  Urgent care should be used for after hours and weekends for significant issues that cannot wait till the next day.  The emergency department should be used for significant potentially life-threatening emergencies.  The emergency department is expensive, can often have long wait times for less significant concerns, so try to utilize primary care, urgent care, or telemedicine when possible to avoid unnecessary trips to the emergency department.  Virtual visits and telemedicine have been introduced since the pandemic started in 2020, and can be convenient ways to receive medical care.  We offer virtual appointments as well to assist you in a variety of options to seek medical care.    Separate significant issues discussed:    There are no diagnoses linked to this encounter.   *** [facility] Cancer  screens Tobacco Obesity Heart Sex PHQ Metrics and health maintenance   Follow-up pending labs, yearly for physical

## 2021-04-16 ENCOUNTER — Other Ambulatory Visit: Payer: Self-pay

## 2021-04-16 ENCOUNTER — Other Ambulatory Visit: Payer: Medicare HMO

## 2021-04-16 DIAGNOSIS — E1169 Type 2 diabetes mellitus with other specified complication: Secondary | ICD-10-CM

## 2021-04-16 DIAGNOSIS — Z0000 Encounter for general adult medical examination without abnormal findings: Secondary | ICD-10-CM | POA: Diagnosis not present

## 2021-04-16 DIAGNOSIS — R3129 Other microscopic hematuria: Secondary | ICD-10-CM | POA: Diagnosis not present

## 2021-04-16 DIAGNOSIS — H524 Presbyopia: Secondary | ICD-10-CM | POA: Diagnosis not present

## 2021-04-16 DIAGNOSIS — Z961 Presence of intraocular lens: Secondary | ICD-10-CM | POA: Diagnosis not present

## 2021-04-16 DIAGNOSIS — Z794 Long term (current) use of insulin: Secondary | ICD-10-CM

## 2021-04-16 DIAGNOSIS — E785 Hyperlipidemia, unspecified: Secondary | ICD-10-CM | POA: Diagnosis not present

## 2021-04-16 DIAGNOSIS — E119 Type 2 diabetes mellitus without complications: Secondary | ICD-10-CM | POA: Diagnosis not present

## 2021-04-16 LAB — HEMOGLOBIN A1C
Est. average glucose Bld gHb Est-mCnc: 163 mg/dL
Hgb A1c MFr Bld: 7.3 % — ABNORMAL HIGH (ref 4.8–5.6)

## 2021-04-16 LAB — LIPID PANEL
Chol/HDL Ratio: 2.5 ratio (ref 0.0–4.4)
Cholesterol, Total: 122 mg/dL (ref 100–199)
HDL: 48 mg/dL (ref >39)
LDL Chol Calc (NIH): 56 mg/dL (ref 0–99)
Triglycerides: 97 mg/dL (ref 0–149)
VLDL Cholesterol Cal: 18 mg/dL (ref 5–40)

## 2021-04-16 LAB — COMPREHENSIVE METABOLIC PANEL
ALT: 16 IU/L (ref 0–32)
AST: 11 IU/L (ref 0–40)
Albumin/Globulin Ratio: 2.1 (ref 1.2–2.2)
Albumin: 4.6 g/dL (ref 3.6–4.6)
Alkaline Phosphatase: 95 IU/L (ref 44–121)
BUN/Creatinine Ratio: 21 (ref 12–28)
BUN: 16 mg/dL (ref 8–27)
Bilirubin Total: 0.3 mg/dL (ref 0.0–1.2)
CO2: 19 mmol/L — ABNORMAL LOW (ref 20–29)
Calcium: 9.6 mg/dL (ref 8.7–10.3)
Chloride: 103 mmol/L (ref 96–106)
Creatinine, Ser: 0.76 mg/dL (ref 0.57–1.00)
Globulin, Total: 2.2 g/dL (ref 1.5–4.5)
Glucose: 140 mg/dL — ABNORMAL HIGH (ref 65–99)
Potassium: 4.1 mmol/L (ref 3.5–5.2)
Sodium: 137 mmol/L (ref 134–144)
Total Protein: 6.8 g/dL (ref 6.0–8.5)
eGFR: 78 mL/min/{1.73_m2} (ref >59)

## 2021-04-16 LAB — HM DIABETES EYE EXAM

## 2021-04-16 NOTE — Telephone Encounter (Signed)
Given to patient  

## 2021-04-17 ENCOUNTER — Encounter: Payer: Self-pay | Admitting: Medical

## 2021-04-17 LAB — URINALYSIS, MICROSCOPIC ONLY
Bacteria, UA: NONE SEEN
Casts: NONE SEEN /lpf

## 2021-04-20 ENCOUNTER — Other Ambulatory Visit: Payer: Self-pay | Admitting: Medical

## 2021-04-20 ENCOUNTER — Telehealth: Payer: Self-pay | Admitting: Medical

## 2021-04-20 MED ORDER — OXYBUTYNIN CHLORIDE 5 MG PO TABS: 5.0000 mg | ORAL_TABLET | Freq: Two times a day (BID) | ORAL | 3 refills | Status: DC

## 2021-04-20 MED ORDER — VITAMIN B-6 25 MG PO TABS: 25.0000 mg | ORAL_TABLET | Freq: Every day | ORAL | 0 refills | Status: AC

## 2021-04-20 NOTE — Telephone Encounter (Signed)
Pharmacy sent new refill for oxybutinin please send a new pharmacy  [facility] [id number] [place]

## 2021-05-02 ENCOUNTER — Ambulatory Visit (HOSPITAL_COMMUNITY)
Admission: EM | Admit: 2021-05-02 | Discharge: 2021-05-02 | Disposition: A | Discharge status: Home / Self Care | Payer: Medicare HMO | Attending: Family Medicine | Admitting: Family Medicine

## 2021-05-02 ENCOUNTER — Encounter (HOSPITAL_COMMUNITY): Payer: Self-pay | Admitting: *Deleted

## 2021-05-02 ENCOUNTER — Other Ambulatory Visit: Payer: Self-pay

## 2021-05-02 DIAGNOSIS — H00014 Hordeolum externum left upper eyelid: Secondary | ICD-10-CM | POA: Diagnosis not present

## 2021-05-02 MED ORDER — CEPHALEXIN 500 MG PO CAPS: 500.0000 mg | ORAL_CAPSULE | Freq: Two times a day (BID) | ORAL | 0 refills | Status: DC

## 2021-05-02 MED ORDER — ERYTHROMYCIN 5 MG/GM OP OINT: TOPICAL_OINTMENT | OPHTHALMIC | 0 refills | Status: DC

## 2021-05-02 NOTE — ED Triage Notes (Signed)
Pt reports eye pain and redness to Lt eye

## 2021-05-02 NOTE — ED Provider Notes (Signed)
[place]    CSN: [id number] Arrival date & time: 05/02/21  1004      History   Chief Complaint Chief Complaint  Patient presents with   Eye Problem    HPI Marissa Oliver is a 80 y.o. female.   Patient presenting today with 3-day history of left eye lid irritation, redness now spreading to left under eye area.  Denies injury to the eye, eye itching, globe redness, vision changes, headache, nausea, vomiting, fever.  So far not trying anything over-the-counter for symptoms.  States she just had a vision exam through her eye doctor 2 weeks ago and all was normal per specialist.   Past Medical History:  Diagnosis Date   Atrophic vaginitis    estrace cream rx'd by Dr. 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   Chronic idiopathic constipation 12/17/2020   Diverticulosis of colon 12/17/2020   Obesity 12/17/2020   Need for pneumococcal vaccination 12/02/2020   Hearing loss 12/02/2020   Estrogen deficiency 12/02/2020   Post-menopausal 12/02/2020   Encounter for screening for vascular disease 12/02/2020   Screening for heart disease 12/02/2020   History of recurrent UTIs 11/13/2020   Dyslipidemia associated with type 2 diabetes mellitus ([place]) 07/09/2020   Senile purpura ([place]) 04/07/2020   Insomnia 04/07/2020   Psoriasis 04/07/2020   Decreased pulses in feet 12/19/2018   Former smoker 12/09/2016   History of colonic  polyps 04/29/2016   Medicare annual wellness visit, subsequent 04/29/2016   Screening for cancer 04/29/2016   Vaccine counseling 04/29/2016   Type 2 diabetes mellitus with other specified complication ([place]) 23/55/7322   Need for influenza vaccination 08/04/2015   Vitamin D deficiency 10/15/2014   Atrophic vaginitis 01/29/2014   Urinary incontinence 01/29/2014   Generalized anxiety disorder 01/29/2014   Overactive bladder 01/29/2014   Osteoporosis 08/12/2011    Past Surgical History:  Procedure Laterality Date   ABDOMINAL HYSTERECTOMY  1972   partial   APPENDECTOMY     CARDIOVASCULAR STRESS TEST  12/25/2004   EF 65%. OVERALL NORMAL STRESS CARDIOLITE. NO EVIDENCE  OF ISCHEMIA   COLONOSCOPY  08/2010   cologuard 01/2016 normal   PUBOVAGINAL SLING  04/1998   TUBAL LIGATION      OB History     Gravida  3   Para  3   Term      Preterm      AB      Living  3      SAB      IAB      Ectopic      Multiple      Live Births               Home Medications    Prior to Admission medications   Medication Sig Start Date End Date Taking? Authorizing Provider  cephALEXin (KEFLEX) 500 MG capsule Take 1 capsule (  500 mg total) by mouth 2 (two) times daily. 05/02/21  Yes Volney American, PA-C  erythromycin ophthalmic ointment Place a 1/2 inch ribbon of ointment into the left lower eyelid BID prn. 05/02/21  Yes Volney American, PA-C  aspirin EC 81 MG tablet Take 1 tablet (81 mg total) by mouth every other day. Swallow whole. 12/11/20   Pemberton, Greer Ee, MD  FARXIGA 10 MG TABS tablet Take 1 tablet (10 mg total) by mouth daily. 04/09/20   Tysinger, Camelia Eng, PA-C  Insulin Glargine (BASAGLAR KWIKPEN) 100 UNIT/ML Inject 40 Units into the skin daily. 12/02/20   Tysinger, Camelia Eng, PA-C  Insulin Pen Needle (B-D UF III MINI PEN NEEDLES) 31G X 5 MM MISC USE ONCE DAILY 12/29/20   Tysinger, Camelia Eng, PA-C  LINZESS 290 MCG CAPS capsule Take 1 capsule (290 mcg total) by mouth  daily. 02/17/21   Thornton Park, MD  lisinopril (ZESTRIL) 5 MG tablet Take 1 tablet (5 mg total) by mouth daily. 01/22/21   Tysinger, Camelia Eng, PA-C  metFORMIN (GLUCOPHAGE) 500 MG tablet TAKE 1 TABLET BY MOUTH  DAILY WITH BREAKFAST Patient taking differently: TAKE 1 TABLET BY MOUTH  DAILY WITH BREAKFAST 01/15/21   Tysinger, Camelia Eng, PA-C  ONETOUCH ULTRA test strip USE AS DIRECTED TO TEST BLOOD SUGARS TWICE A DAY 01/29/21   Tysinger, Camelia Eng, PA-C  oxybutynin (DITROPAN) 5 MG tablet Take 1 tablet (5 mg total) by mouth 2 (two) times daily. 04/20/21   Tysinger, Camelia Eng, PA-C  rosuvastatin (CRESTOR) 20 MG tablet TAKE 1 TABLET BY MOUTH EVERY DAY 04/15/21   Tysinger, Camelia Eng, PA-C  vitamin B-6 (PYRIDOXINE) 25 MG tablet Take 1 tablet (25 mg total) by mouth daily. 04/20/21   Tysinger, Camelia Eng, PA-C    Family History Family History  Problem Relation Age of Onset   Arthritis Mother    Heart failure Mother    Mental illness Father    Kidney disease Father    Stroke Father    Heart attack Father    Heart disease Brother    Cancer Brother        prostate   Stroke Maternal Aunt    Cancer Maternal Uncle    Stroke Paternal Grandmother     Social History Social History   Tobacco Use   Smoking status: Former    Packs/day: 0.50    Pack years: 0.00    Types: Cigarettes    Quit date: 05/22/2012    Years since quitting: 8.9   Smokeless tobacco: Never  Vaping Use   Vaping Use: Never used  Substance Use Topics   Alcohol use: No    Alcohol/week: 0.0 standard drinks   Drug use: No     Allergies   Patient has no known allergies.   Review of Systems Review of Systems Per HPI  Physical Exam Triage Vital Signs ED Triage Vitals  Enc Vitals Group     BP 05/02/21 1013 (!) 142/54     Pulse Rate 05/02/21 1013 76     Resp 05/02/21 1013 16     Temp 05/02/21 1013 98.4 F (36.9 C)     Temp Source 05/02/21 1013 Oral     SpO2 05/02/21 1013 98 %     Weight --      Height --      Head  Circumference --      Peak Flow --      Pain Score 05/02/21 1018 2  Pain Loc --      Pain Edu? --      Excl. in [place]? --    No data found.  Updated Vital Signs BP (!) 142/54 (BP Location: Right Arm)   Pulse 76   Temp 98.4 F (36.9 C) (Oral)   Resp 16   SpO2 98%   Visual Acuity  Right Eye Distance: 20/50 Left Eye Distance: 20/30 Bilateral Distance: 20/40  Right Eye Near:   Left Eye Near:    Bilateral Near:     Physical Exam Vitals and nursing note reviewed.  Constitutional:      Appearance: Normal appearance. She is not ill-appearing.  HENT:     Head: Atraumatic.  Eyes:     Extraocular Movements: Extraocular movements intact.     Pupils: Pupils are equal, round, and reactive to light.     Comments: 2 small pustules present on left lower lash line and one larger stye present on left upper eyelid.  Area of erythema extending about 1.5 inches below the lower lash line to the under eye area.  Bilateral conjunctiva benign  Cardiovascular:     Rate and Rhythm: Normal rate and regular rhythm.     Heart sounds: Normal heart sounds.  Pulmonary:     Effort: Pulmonary effort is normal.     Breath sounds: Normal breath sounds.  Musculoskeletal:        General: Normal range of motion.     Cervical back: Normal range of motion and neck supple.  Skin:    General: Skin is warm and dry.  Neurological:     Mental Status: She is alert and oriented to person, place, and time.  Psychiatric:        Mood and Affect: Mood normal.        Thought Content: Thought content normal.        Judgment: Judgment normal.   UC Treatments / Results  Labs (all labs ordered are listed, but only abnormal results are displayed) Labs Reviewed - No data to display  EKG   Radiology No results found.  Procedures Procedures (including critical care time)  Medications Ordered in UC Medications - No data to display  Initial Impression / Assessment and Plan / UC Course  I have reviewed the  triage vital signs and the nursing notes.  Pertinent labs & imaging results that were available during my care of the patient were reviewed by me and considered in my medical decision making (see chart for details).     Stye to both upper and lower left eye, will treat with oral Keflex, erythromycin ointment, warm compresses.  Over-the-counter pain relievers as needed.  Follow-up with eye specialist if worsening or not resolving.  Final Clinical Impressions(s) / UC Diagnoses   Final diagnoses:  Hordeolum externum of left upper eyelid   Discharge Instructions   None    ED Prescriptions     Medication Sig Dispense Auth. Provider   cephALEXin (KEFLEX) 500 MG capsule Take 1 capsule (500 mg total) by mouth 2 (two) times daily. 14 capsule Volney American, [place]   erythromycin ophthalmic ointment Place a 1/2 inch ribbon of ointment into the left lower eyelid BID prn. 3.5 g Volney American, [place]      PDMP not reviewed this encounter.   Volney American, [place] 05/02/21 1059

## 2021-05-15 ENCOUNTER — Other Ambulatory Visit: Payer: Self-pay | Admitting: Medical

## 2021-05-15 DIAGNOSIS — E118 Type 2 diabetes mellitus with unspecified complications: Secondary | ICD-10-CM

## 2021-05-26 ENCOUNTER — Encounter: Payer: Self-pay | Admitting: Medical

## 2021-05-26 ENCOUNTER — Other Ambulatory Visit: Payer: Self-pay

## 2021-05-26 ENCOUNTER — Telehealth: Payer: Self-pay

## 2021-05-26 ENCOUNTER — Ambulatory Visit (INDEPENDENT_AMBULATORY_CARE_PROVIDER_SITE_OTHER): Payer: Medicare HMO | Admitting: Medical

## 2021-05-26 VITALS — BP 124/82 | HR 64 | Temp 98.1°F | Wt 169.2 lb

## 2021-05-26 DIAGNOSIS — D692 Other nonthrombocytopenic purpura: Secondary | ICD-10-CM | POA: Diagnosis not present

## 2021-05-26 DIAGNOSIS — T148XXA Other injury of unspecified body region, initial encounter: Secondary | ICD-10-CM | POA: Diagnosis not present

## 2021-05-26 DIAGNOSIS — Z23 Encounter for immunization: Secondary | ICD-10-CM | POA: Diagnosis not present

## 2021-05-26 DIAGNOSIS — W19XXXA Unspecified fall, initial encounter: Secondary | ICD-10-CM | POA: Diagnosis not present

## 2021-05-26 MED ORDER — CEPHALEXIN 500 MG PO CAPS: 500.0000 mg | ORAL_CAPSULE | Freq: Three times a day (TID) | ORAL | 0 refills | Status: DC

## 2021-05-26 MED ORDER — MUPIROCIN 2 % EX OINT: 1.0000 "application " | TOPICAL_OINTMENT | Freq: Two times a day (BID) | CUTANEOUS | 0 refills | Status: DC

## 2021-05-26 NOTE — Telephone Encounter (Signed)
Pt Risk analyst completed

## 2021-05-26 NOTE — Progress Notes (Signed)
Subjective:  Marissa Oliver is a 80 y.o. female who presents for Chief Complaint  Patient presents with   other    [facility] Sunday skinned lt. Arm      DOI: 05/24/21.  Was lifting a box out of the car, sat it on the banister, and when she went to move the box, she fell backwards she thinks . She fell against the porch and landed on the ground.  She notes injury to left arm, left great toe.   She tore skin of the left forearm and has abrasions of the right arm and left great toe.  Today she notes some throbbing pain of her arm where she tore her skin.  Left great toe doesn't feel so bad.   She also accidentally poked her left lower leg with an ink pen last night.  She has some soreness with bending wrist.   The skin tear bled quite a bit.    Denies head injury, no LOC.  Landed on buttock.     No other aggravating or relieving factors.    No other c/o.  The following portions of the patient's history were reviewed and updated as appropriate: allergies, current medications, past family history, past medical history, past social history, past surgical history and problem list.  ROS Otherwise as in subjective above  Objective: BP 124/82   Pulse 64   Temp 98.1 F (36.7 C)   Wt 169 lb 3.2 oz (76.7 kg)   BMI 30.95 kg/m   General appearance: alert, no distress, well developed, well nourished Skin: Left posterior forearm with 3 separate skin avulsions each approximately 2 cm x 2 cm, no obvious induration warmth or pus, there is generalized bruising of the left forearm throughout, there are a few small dry skin abrasions of the right posterior forearm, left great toe medial surface of the distal phalanx with abrasion and 1 small 2 mm shallow puncture/abrasion wound.  Left lower leg distal third anteriorly with small 3 mm diameter wound that appears to be a shallow puncture wound from where she notes ink pen stab her Msk: She is tender over the left distal forearm and over the areas where the  avulsion of the skin took place but otherwise nontender, she seems to have relatively normal range of motion of the left forearm and wrist slight localized tissue swelling with the avulsed skin otherwise fingers hands legs and feet nontender without other deformity HEENT: normocephalic, sclerae anicteric, conjunctiva pink and moist, TMs pearly, nares patent, no discharge or erythema, pharynx normal Oral cavity: MMM, no lesions Neck: supple, no lymphadenopathy, no thyromegaly, no masses Pulses: 2+ radial pulses, 2+ pedal pulses, normal cap refill Ext: no edema Psych: Pleasant, good eye contact, answers questions appropriately Neuro: CN II through XII intact, nonfocal exam    Assessment: Encounter Diagnoses  Name Primary?   Skin avulsion Yes   Fall, initial encounter    Need for Td vaccine    Puncture wound    Senile purpura ([place])    Abrasion    Bruising      Plan: Counseled on the Td (tetanus, diptheria) vaccine.  Vaccine information sheet given. Td vaccine given after consent obtained.  We discussed her injury and findings.  I cleaned her forearm wounds with saline.  I used nonstick bandages and mupirocin ointment and bandaged up the wounds.  We discussed wound care  I recommended a left forearm x-ray but she declines   Recommendations discussed and printed for patient Begin Keflex  antibiotic 1 tablet 3 times a day for 10 days for potential infection from the recent puncture wound and tear of skin I want you to rinse your wounds with water such as in the shower daily.  Use mild soap and water for cleaning gently, but flush the wounds with a good amount of water At night and during part of the day you can put either the mupirocin ointment or over-the-counter Neosporin on the wounds of your hand and foot  I do recommend you use nonstick bandages for the skin tears of your arm and then cover with a dressing like I did in the office today Part of the day I want you to air out the  wounds so they can start to scab For example use bandages and dressings on the toe and arm during the nighttime and during part of the daytime, but at least 4 hours out of the day I want you to leave the wounds uncovered when you are going to be at home resting We updated your tetanus booster today Over the next week if you have any swelling, redness, warmth, worse pain or pus drainage from any of the wounds then recheck right away   [name] was seen today for other.  Diagnoses and all orders for this visit:  Skin avulsion  Fall, initial encounter  Need for Td vaccine  Puncture wound  Senile purpura ([place])  Abrasion  Bruising  Other orders -     Td : Tetanus/diphtheria >7yo Preservative  free -     cephALEXin (KEFLEX) 500 MG capsule; Take 1 capsule (500 mg total) by mouth 3 (three) times daily. -     mupirocin ointment (BACTROBAN) 2 %; Apply 1 application topically 2 (two) times daily.   Follow up: prn

## 2021-05-26 NOTE — Patient Instructions (Signed)
Encounter Diagnoses  Name Primary?   Skin avulsion Yes   Fall, initial encounter    Need for Td vaccine    Puncture wound    Senile purpura ([place])    Abrasion    Bruising     Recommendations Begin Keflex antibiotic 1 tablet 3 times a day for 10 days for potential infection from the recent puncture wound and tear of skin I want you to rinse your wounds with water such as in the shower daily.  Use mild soap and water for cleaning gently, but flush the wounds with a good amount of water At night and during part of the day you can put either the mupirocin ointment or over-the-counter Neosporin on the wounds of your hand and foot  I do recommend you use nonstick bandages for the skin tears of your arm and then cover with a dressing like I did in the office today Part of the day I want you to air out the wounds so they can start to scab For example use bandages and dressings on the toe and arm during the nighttime and during part of the daytime, but at least 4 hours out of the day I want you to leave the wounds uncovered when you are going to be at home resting We updated your tetanus booster today Over the next week if you have any swelling, redness, warmth, worse pain or pus drainage from any of the wounds then recheck right away

## 2021-05-27 ENCOUNTER — Other Ambulatory Visit: Payer: Self-pay | Admitting: Medical

## 2021-06-22 NOTE — Telephone Encounter (Signed)
Pt Assistance Levemir Flex Touch received, called pt and informed

## 2021-07-14 ENCOUNTER — Encounter: Payer: Self-pay | Admitting: Gastroenterology

## 2021-07-14 ENCOUNTER — Ambulatory Visit: Payer: Medicare HMO | Admitting: Gastroenterology

## 2021-07-14 VITALS — BP 130/70 | HR 76 | Ht 60.0 in | Wt 168.0 lb

## 2021-07-14 DIAGNOSIS — K219 Gastro-esophageal reflux disease without esophagitis: Secondary | ICD-10-CM | POA: Diagnosis not present

## 2021-07-14 DIAGNOSIS — K5900 Constipation, unspecified: Secondary | ICD-10-CM | POA: Diagnosis not present

## 2021-07-14 MED ORDER — LINZESS 290 MCG PO CAPS: 290.0000 ug | ORAL_CAPSULE | Freq: Every day | ORAL | 5 refills | Status: DC

## 2021-07-14 MED ORDER — PANTOPRAZOLE SODIUM 40 MG PO TBEC: 40.0000 mg | DELAYED_RELEASE_TABLET | ORAL | 3 refills | Status: DC

## 2021-07-14 NOTE — Progress Notes (Signed)
Referring Provider: Carlena Hurl, PA-C Primary Care Physician:  Carlena Hurl, PA-C  Chief complaint:  Reflux   IMPRESSION:  Chronic constipation    -previously evaluated and treated by Dr. Collene Mares    -symptoms controlled on Linzess but cost is an issue Reflux History of colon polyps without interested in surveillance colonoscopy    PLAN: - Refill Linzess 290 mcg daily (additional samples provided today) - Start pantoprazole 40 mg QAM - Trial of Miralax 17g daily - Low threshold to obtain CT scan with any alarm features - Low threshold for EGD given new symptom onset - Follow-up in 3 months, earlier as needed  Please see the "Patient Instructions" section for addition details about the plan.  HPI: Marissa Oliver is a 80 y.o. female who returns in follow-up after her initial office visit 02/17/21. Has a history of anxiety, depression, diabetes, urinary tract infections, and IBS  Review of records from prior GI care with Dr. Collene Mares revealed:.  - Colonoscopy 03/24/1999 was normal except for diverticulosis and small hemorrhoids.  - Colonoscopy with Dr. Collene Mares 04/23/2005 showed small internal hemorrhoids, sigmoid diverticulosis, and no polyps. - Colonoscopy 08/31/2010 which showed scattered diverticulosis, hyperplastic polyps from the rectosigmoid colon, and a descending colon tubular adenoma.  - Records from 2017 show 1-2 bowel movements a day with no blood or mucus in the stool.  A Cologuard DNA test was recommended because the patient was 74.  Negative Cologuard 02/16/2016.  - Seen for 2 to 3 weeks of rectal pain in 2017 with a sensation that there was a "ball in the rectum". Treated for chronic anal fissure with RectiCare cream and sitz bath's.  Also treated for constipation with Linzess 290 mcg daily, IBgard, and FD guard.  - Seen in 2019 for rectal pressure.  Exam revealed a large amount of stool in the rectal vault.  She was given samples of Linzess and told to use IBgard and FD  guard.  - Seen 12/27/2019 for rectal pain.  She was treated with Linzess for constipation.   Initially presented care to refill her Linzess. She has been taking Linzess 290 mcg daily since that time with relief of constipation having a bowel movement every 2-3 days. However, the Linzess costs her $100 month. No straining. Sense of complete evacuation.  There is no blood or mucus in the stool.  Multiple psychosocial stressors: brother died in 04/23/2023, brother that she lives with is having heart trouble, she has had to take care of her daughter's pets.   Prior treatments for constipation include: - Metamucil most days - Linzess 290 mcg daily, but she finds this cost prohibitive - no other OTC or prescription medical therapies  Labs 12/19/18: normal TSH Labs 12/02/20: normal calcium 9.8, normal CBC  No recent abdominal imaging.  She has not wished to have any additional colonoscopy.   Today reports progressive reflux. No evidence for GI bleeding, iron deficiency anemia, anorexia, unexplained weight loss, dysphagia, odynophagia, persistent vomiting, or gastrointestinal cancer in a first-degree relative. Wondering if she can use her brother's pantoprazole.   Past Medical History:  Diagnosis Date   Atrophic vaginitis    estrace cream rx'd by Dr. Karsten Ro   Colon polyp    tubular adenoma (colonoscopy q5 yrs)   Diabetes mellitus 2005   Diverticulosis    Dyslipidemia    GAD (generalized anxiety disorder)    Hemorrhoids    Hyperlipidemia    OAB (overactive bladder)    Osteoporosis    Psoriasis  mild, sees [facility]   Smoker     Past Surgical History:  Procedure Laterality Date   ABDOMINAL HYSTERECTOMY  1972   partial   APPENDECTOMY     CARDIOVASCULAR STRESS TEST  12/25/2004   EF 65%. OVERALL NORMAL STRESS CARDIOLITE. NO EVIDENCE  OF ISCHEMIA   COLONOSCOPY  08/2010   cologuard 01/2016 normal   PUBOVAGINAL SLING  04/1998   TUBAL LIGATION      Current Outpatient Medications   Medication Sig Dispense Refill   Insulin Pen Needle (B-D UF III MINI PEN NEEDLES) 31G X 5 MM MISC USE ONCE DAILY 100 each 1   lisinopril (ZESTRIL) 5 MG tablet Take 1 tablet (5 mg total) by mouth daily. 90 tablet 3   ONETOUCH ULTRA test strip USE AS DIRECTED TO TEST BLOOD SUGARS TWICE A DAY 200 strip 1   oxybutynin (DITROPAN) 5 MG tablet Take 1 tablet (5 mg total) by mouth 2 (two) times daily. 180 tablet 3   pantoprazole (PROTONIX) 40 MG tablet Take 1 tablet (40 mg total) by mouth every morning. 90 tablet 3   rosuvastatin (CRESTOR) 20 MG tablet TAKE 1 TABLET BY MOUTH EVERY DAY 90 tablet 3   vitamin B-6 (PYRIDOXINE) 25 MG tablet Take 1 tablet (25 mg total) by mouth daily. 90 tablet 0   aspirin EC 81 MG tablet Take 1 tablet (81 mg total) by mouth every other day. Swallow whole. 90 tablet 3   FARXIGA 10 MG TABS tablet Take 1 tablet (10 mg total) by mouth daily. 90 tablet 3   Insulin Glargine (BASAGLAR KWIKPEN) 100 UNIT/ML Inject 40 Units into the skin daily. 3 mL 11   LINZESS 290 MCG CAPS capsule Take 1 capsule (290 mcg total) by mouth daily. 30 capsule 5   olopatadine (PATADAY) 0.1 % ophthalmic solution Place 1 drop into both eyes 2 (two) times daily. 5 mL 1   No current facility-administered medications for this visit.    Allergies as of 07/14/2021   (No Known Allergies)    Family History  Problem Relation Age of Onset   Arthritis Mother    Heart failure Mother    Mental illness Father    Kidney disease Father    Stroke Father    Heart attack Father    Heart disease Brother    Cancer Brother        prostate   Stroke Maternal Aunt    Cancer Maternal Uncle    Stroke Paternal Grandmother      Physical Exam: General:   Alert,  well-nourished, pleasant and cooperative in NAD, she appears her stated age Head:  Normocephalic and atraumatic. Eyes:  Sclera clear, no icterus.   Conjunctiva pink. Abdomen:  Soft, nontender, nondistended, normal bowel sounds, no rebound or guarding. No  hepatosplenomegaly.   Rectal:  Deferred  Msk:  Symmetrical. No boney deformities LAD: No inguinal or umbilical LAD Extremities:  No clubbing or edema. Neurologic:  Alert and  oriented x4;  grossly nonfocal Skin:  Intact without significant lesions or rashes. Psych:  Alert and cooperative. Normal mood and affect.     Rionna Feltes L. Tarri Glenn, MD, MPH 07/20/2021, 8:43 PM

## 2021-07-14 NOTE — Patient Instructions (Addendum)
It was my pleasure to provide care to you today. Based on our discussion, I am providing you with my recommendations below:  RECOMMENDATION(S):   Please dissolve 1 capful of Miralax in 8 ounces of liquid daily  PRESCRIPTION MEDICATION(S):   We have sent the following medication(s) to your pharmacy:  Pantoprazole Linzess  NOTE: If your medication(s) requires a PRIOR AUTHORIZATION, we will receive notification from your pharmacy. Once received, the process to submit for approval may take up to 7-10 business days. You will be contacted about any denials we have received from your insurance company as well as alternatives recommended by your provider.  FOLLOW UP:  I would like for you to follow up with me in 3 months. Please call the office at [phone] to schedule your appointment.  BMI:  If you are age 43 or older, your body mass index should be between 23-30. Your Body mass index is 32.81 kg/m. If this is out of the aforementioned range listed, please consider follow up with your Primary Care Provider.  MY CHART:  The [place] [facility] providers would like to encourage you to use [facility] to communicate with providers for non-urgent requests or questions.  Due to long hold times on the telephone, sending your provider a message by [facility] may be a faster and more efficient way to get a response.  Please allow 48 business hours for a response.  Please remember that this is for non-urgent requests.   Thank you for trusting me with your gastrointestinal care!    Thornton Park, MD, MPH

## 2021-07-20 ENCOUNTER — Encounter: Payer: Self-pay | Admitting: Gastroenterology

## 2021-07-20 ENCOUNTER — Other Ambulatory Visit: Payer: Self-pay

## 2021-07-20 ENCOUNTER — Ambulatory Visit (INDEPENDENT_AMBULATORY_CARE_PROVIDER_SITE_OTHER): Payer: Medicare HMO | Admitting: Medical

## 2021-07-20 VITALS — BP 128/70 | HR 45 | Wt 167.6 lb

## 2021-07-20 DIAGNOSIS — Z23 Encounter for immunization: Secondary | ICD-10-CM | POA: Diagnosis not present

## 2021-07-20 DIAGNOSIS — Z794 Long term (current) use of insulin: Secondary | ICD-10-CM

## 2021-07-20 DIAGNOSIS — I739 Peripheral vascular disease, unspecified: Secondary | ICD-10-CM

## 2021-07-20 DIAGNOSIS — Q845 Enlarged and hypertrophic nails: Secondary | ICD-10-CM | POA: Diagnosis not present

## 2021-07-20 DIAGNOSIS — E785 Hyperlipidemia, unspecified: Secondary | ICD-10-CM | POA: Diagnosis not present

## 2021-07-20 DIAGNOSIS — H1013 Acute atopic conjunctivitis, bilateral: Secondary | ICD-10-CM

## 2021-07-20 DIAGNOSIS — Z7185 Encounter for immunization safety counseling: Secondary | ICD-10-CM

## 2021-07-20 DIAGNOSIS — E1169 Type 2 diabetes mellitus with other specified complication: Secondary | ICD-10-CM | POA: Diagnosis not present

## 2021-07-20 MED ORDER — FARXIGA 10 MG PO TABS: 10.0000 mg | ORAL_TABLET | Freq: Every day | ORAL | 3 refills | Status: DC

## 2021-07-20 MED ORDER — BASAGLAR KWIKPEN 100 UNIT/ML ~~LOC~~ SOPN: 40.0000 [IU] | PEN_INJECTOR | Freq: Every day | SUBCUTANEOUS | 11 refills | Status: DC

## 2021-07-20 MED ORDER — OLOPATADINE HCL 0.1 % OP SOLN: 1.0000 [drp] | Freq: Two times a day (BID) | OPHTHALMIC | 1 refills | Status: DC

## 2021-07-20 MED ORDER — ASPIRIN EC 81 MG PO TBEC: 81.0000 mg | DELAYED_RELEASE_TABLET | ORAL | 3 refills | Status: DC

## 2021-07-20 NOTE — Progress Notes (Signed)
Subjective:  Marissa Oliver is a 80 y.o. female who presents for Chief Complaint  Patient presents with   diabetes     Diabetes, puffy eye from having stye about a month ago, would like flu shot today     Type 2 diabetes mellitus with other specified complication ([place]) most of the time glucose readings in the 90-120 range.  Taking Farxiga '10mg'$  and using 40 units of basaglar daily.  Dyslipidemia associated with type 2 diabetes mellitus ([place]) Compliant with rosuvastatin without c/o   Enlarged and hypertrophic nails She cuts her own toenails.  She is doing daily foot checks.  No recent lesions of concern.  She does pick at her toenails.  PAD (peripheral artery disease) ([place]) She walks for exercise.  She had some styes recently.  Those resolved but she still has some puffiness under her eyes.  Sometimes her eyes feel dry or itchy.  No foreign body concern.  No other aggravating or relieving factors.    No other c/o.  Past Medical History:  Diagnosis Date   Atrophic vaginitis    estrace cream rx'd by Dr. Karsten Oliver   Colon polyp    tubular adenoma (colonoscopy q5 yrs)   Diabetes mellitus 2005   Diverticulosis    Dyslipidemia    GAD (generalized anxiety disorder)    Hemorrhoids    Hyperlipidemia    OAB (overactive bladder)    Osteoporosis    Psoriasis    mild, sees [facility]   Smoker    Current Outpatient Medications on File Prior to Visit  Medication Sig Dispense Refill   Insulin Pen Needle (B-D UF III MINI PEN NEEDLES) 31G X 5 MM MISC USE ONCE DAILY 100 each 1   LINZESS 290 MCG CAPS capsule Take 1 capsule (290 mcg total) by mouth daily. 30 capsule 5   lisinopril (ZESTRIL) 5 MG tablet Take 1 tablet (5 mg total) by mouth daily. 90 tablet 3   ONETOUCH ULTRA test strip USE AS DIRECTED TO TEST BLOOD SUGARS TWICE A DAY 200 strip 1   oxybutynin (DITROPAN) 5 MG tablet Take 1 tablet (5 mg total) by mouth 2 (two) times daily. 180 tablet 3   pantoprazole (PROTONIX) 40 MG  tablet Take 1 tablet (40 mg total) by mouth every morning. 90 tablet 3   rosuvastatin (CRESTOR) 20 MG tablet TAKE 1 TABLET BY MOUTH EVERY DAY 90 tablet 3   vitamin B-6 (PYRIDOXINE) 25 MG tablet Take 1 tablet (25 mg total) by mouth daily. 90 tablet 0   No current facility-administered medications on file prior to visit.     The following portions of the patient's history were reviewed and updated as appropriate: allergies, current medications, past family history, past medical history, past social history, past surgical history and problem list.  ROS Otherwise as in subjective above    Objective: BP 128/70   Pulse (!) 45   Wt 167 lb 9.6 oz (76 kg)   BMI 32.73 kg/m   General appearance: alert, no distress, well developed, well nourished Neck: supple, no lymphadenopathy, no thyromegaly, no masses Heart: RRR, normal S1, S2, no murmurs Lungs: CTA bilaterally, no wheezes, rhonchi, or rales Pulses: 2+ radial pulses, 2+ pedal pulses, normal cap refill Ext: no edema  Diabetic Foot Exam - Simple   Simple Foot Form Diabetic Foot exam was performed with the following findings: Yes 07/20/2021 12:56 PM  Visual Inspection See comments: Yes Sensation Testing Intact to touch and monofilament testing bilaterally: Yes Pulse Check Posterior  Tibialis and Dorsalis pulse intact bilaterally: Yes Comments Great toenails bilaterally with some thickened nails, discoloration, several nails are slightly jagged from where she has been picking at her nails      Assessment: Encounter Diagnoses  Name Primary?   Type 2 diabetes mellitus with other specified complication, with long-term current use of insulin (HCC) Yes   Vaccine counseling    Dyslipidemia associated with type 2 diabetes mellitus ([place])    Needs flu shot    Allergic conjunctivitis of both eyes    Enlarged and hypertrophic nails    PAD (peripheral artery disease) ([place])      Plan: Diabetes-hemoglobin A1c lab today.  Home glucose  readings within normal limits.  Advise regular foot checks, yearly eye doctor exam, healthy diet.  Continue Farxiga and insulin basal  Hyperlipidemia/dyslipidemia associated with diabetes-continue statin.  Labs from June were at goal  Allergic conjunctivitis-advise over-the-counter Pataday and cool compresses.  She will give this a try  Hypertrophic nails-offered referral to podiatry.  She declines.  We discussed foot care, nail care.  Advise regular daily foot checks  PAD-c/t walking, good diabetic control  ABI 01/05/21: Summary: Right: Resting right ankle-brachial index is within normal range. No evidence of significant right lower extremity arterial disease. The right toe-brachial index is normal. Left: Resting left ankle-brachial index is within normal range. No evidence of significant left lower extremity arterial disease. The left toe-brachial index is normal.  Counseled on the influenza virus vaccine.  Vaccine information sheet given.   High dose Influenza vaccine given after consent obtained.    [name] was seen today for diabetes .  Diagnoses and all orders for this visit:  Type 2 diabetes mellitus with other specified complication, with long-term current use of insulin ([place]) -     Cancel: Microalbumin/Creatinine Ratio, Urine -     HgB A1c -     Cancel: POCT UA - Microalbumin -     Microalbumin/Creatinine Ratio, Urine  Vaccine counseling  Dyslipidemia associated with type 2 diabetes mellitus (HCC)  Needs flu shot -     Flu Vaccine QUAD High Dose(Fluad)  Allergic conjunctivitis of both eyes  Enlarged and hypertrophic nails  PAD (peripheral artery disease) (HCC)  Other orders -     olopatadine (PATADAY) 0.1 % ophthalmic solution; Place 1 drop into both eyes 2 (two) times daily. -     Insulin Glargine (BASAGLAR KWIKPEN) 100 UNIT/ML; Inject 40 Units into the skin daily. -     FARXIGA 10 MG TABS tablet; Take 1 tablet (10 mg total) by mouth daily. -     aspirin EC 81 MG  tablet; Take 1 tablet (81 mg total) by mouth every other day. Swallow whole.   Follow up: pending labs

## 2021-07-20 NOTE — Assessment & Plan Note (Signed)
most of the time glucose readings in the 90-120 range.  Taking Farxiga '10mg'$  and using 40 units of basaglar daily.

## 2021-07-20 NOTE — Assessment & Plan Note (Signed)
Compliant with rosuvastatin without c/o

## 2021-07-20 NOTE — Assessment & Plan Note (Signed)
She cuts her own toenails.  She is doing daily foot checks.  No recent lesions of concern.  She does pick at her toenails.

## 2021-07-20 NOTE — Assessment & Plan Note (Signed)
She walks for exercise.

## 2021-07-21 LAB — MICROALBUMIN / CREATININE URINE RATIO
Creatinine, Urine: 59.9 mg/dL
Microalb/Creat Ratio: 8 mg/g creat (ref 0–29)
Microalbumin, Urine: 4.8 ug/mL

## 2021-07-21 LAB — POCT GLYCOSYLATED HEMOGLOBIN (HGB A1C): Hemoglobin A1C: 6.8 % — AB (ref 4.0–5.6)

## 2021-07-31 ENCOUNTER — Encounter: Payer: Self-pay | Admitting: Podiatry

## 2021-07-31 NOTE — Telephone Encounter (Signed)
Please schedule

## 2021-07-31 NOTE — Telephone Encounter (Signed)
Please schedule if openings

## 2021-08-26 ENCOUNTER — Ambulatory Visit: Payer: Medicare HMO | Admitting: Podiatry

## 2021-08-28 ENCOUNTER — Encounter: Payer: Self-pay | Admitting: Podiatry

## 2021-09-11 ENCOUNTER — Ambulatory Visit: Payer: Medicare HMO | Admitting: Podiatry

## 2021-09-14 ENCOUNTER — Telehealth: Payer: Self-pay

## 2021-09-24 ENCOUNTER — Encounter: Payer: Self-pay | Admitting: Gastroenterology

## 2021-09-24 DIAGNOSIS — K5900 Constipation, unspecified: Secondary | ICD-10-CM

## 2021-09-26 NOTE — Progress Notes (Deleted)
Cardiology Office Note:    Date:  09/26/2021   ID:  Marissa Oliver, DOB 1941/04/27, MRN [id number]  PCP:  Carlena Hurl, PA-C  [facility] Cardiologist:  Freada Bergeron, MD  [facility] Electrophysiologist:  None   Referring MD: Carlena Hurl, PA-C    History of Present Illness:    Marissa Oliver is a 80 y.o. female with a hx of DMII, GAD, HTN, HLD and toacco use who presents to clinic for follow-up.  Last seen in clinic by me on 12/11/20 where she was having claudication symptoms. ABIs normal in 01/05/21. Saw Dr. Donzetta Matters where she was continued on medical therapy.  Today, ***  Past Medical History:  Diagnosis Date   Atrophic vaginitis    estrace cream rx'd by Dr. Karsten Ro   Colon polyp    tubular adenoma (colonoscopy q5 yrs)   Diabetes mellitus 2005   Diverticulosis    Dyslipidemia    GAD (generalized anxiety disorder)    Hemorrhoids    Hyperlipidemia    OAB (overactive bladder)    Osteoporosis    Psoriasis    mild, sees [facility]   Smoker     Past Surgical History:  Procedure Laterality Date   ABDOMINAL HYSTERECTOMY  1972   partial   APPENDECTOMY     CARDIOVASCULAR STRESS TEST  12/25/2004   EF 65%. OVERALL NORMAL STRESS CARDIOLITE. NO EVIDENCE  OF ISCHEMIA   COLONOSCOPY  08/2010   cologuard 01/2016 normal   PUBOVAGINAL SLING  04/1998   TUBAL LIGATION      Current Medications: No outpatient medications have been marked as taking for the 10/06/21 encounter (Appointment) with Freada Bergeron, MD.     Allergies:   Patient has no known allergies.   Social History   Socioeconomic History   Marital status: Divorced    Spouse name: Not on file   Number of children: Not on file   Years of education: Not on file   Highest education level: Not on file  Occupational History   Not on file  Tobacco Use   Smoking status: Former    Packs/day: 0.50    Types: Cigarettes    Quit date: 05/22/2012    Years since quitting: 9.3   Smokeless  tobacco: Never  Vaping Use   Vaping Use: Never used  Substance and Sexual Activity   Alcohol use: No    Alcohol/week: 0.0 standard drinks   Drug use: No   Sexual activity: Not Currently  Other Topics Concern   Not on file  Social History Narrative   Divorced, 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  Has 3 children.  Exercises some with walking.  04/2021   Social Determinants of Health   Financial Resource Strain: Not on file  Food Insecurity: Not on file  Transportation Needs: Not on file  Physical Activity: Not on file  Stress: Not on file  Social Connections: Not on file     Family History: The patient's family history includes Arthritis in her mother; Cancer in her brother and maternal uncle; Heart attack in her father; Heart disease in her brother; Heart failure in her mother; Kidney disease in her father; Mental illness in her father; Stroke in her father, maternal aunt, and paternal grandmother.  ROS:   Please see the history of present illness.    Review of Systems  Constitutional:  Negative for chills and fever.  HENT:  Positive for hearing loss.   Eyes:  Negative  for blurred vision and redness.  Respiratory:  Negative for shortness of breath.   Cardiovascular:  Negative for chest pain, palpitations, orthopnea, claudication, leg swelling and PND.  Gastrointestinal:  Positive for heartburn. Negative for melena.  Genitourinary:  Negative for hematuria.  Musculoskeletal:  Positive for joint pain. Negative for falls.  Neurological:  Negative for dizziness and loss of consciousness.  Endo/Heme/Allergies:  Negative for polydipsia.  Psychiatric/Behavioral:  Negative for substance abuse.    EKGs/Labs/Other Studies Reviewed:    The following studies were reviewed today: ABI February 01, 2019: Summary:  Right: Resting right ankle-brachial index is within normal range. No  evidence of significant right lower extremity arterial disease. The right  toe-brachial index is normal.    Left: Resting left ankle-brachial index is within normal range. No  evidence of significant left lower extremity arterial disease. The left  toe-brachial index is abnormal.   Myoview 01-31-2005: Normal  EKG:  EKG is  ordered today.  The ekg ordered today demonstrates NSR with HR 63  Recent Labs: 12/02/2020: Hemoglobin 14.4; Platelets 154 04/16/2021: ALT 16; BUN 16; Creatinine, Ser 0.76; Potassium 4.1; Sodium 137  Recent Lipid Panel    Component Value Date/Time   CHOL 122 04/16/2021 0933   TRIG 97 04/16/2021 0933   HDL 48 04/16/2021 0933   CHOLHDL 2.5 04/16/2021 0933   CHOLHDL 2.6 08/18/2017 1024   VLDL 29 05/18/2017 0842   LDLCALC 56 04/16/2021 0933   LDLCALC 52 08/18/2017 1024     Physical Exam:    VS:  There were no vitals taken for this visit.    Wt Readings from Last 3 Encounters:  07/20/21 167 lb 9.6 oz (76 kg)  07/14/21 168 lb (76.2 kg)  05/26/21 169 lb 3.2 oz (76.7 kg)     GEN:  Well nourished, well developed in no acute distress HEENT: Normal NECK: No JVD; No carotid bruits CARDIAC: RRR, no murmurs, rubs, gallops RESPIRATORY:  Clear to auscultation without rales, wheezing or rhonchi  ABDOMEN: Soft, non-tender, non-distended MUSCULOSKELETAL:  No edema; No deformity. Warm. Palpable DP pulse. SKIN: Warm and dry NEUROLOGIC:  Alert and oriented x 3 PSYCHIATRIC:  Normal affect   ASSESSMENT:    No diagnosis found.  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 ABIs unremarkable on both legs. Denies symptoms of claudication. Has occasional cramping at night but no symptoms with exertion. Extremities are warm with no ulcerations.  -Continue crestor 20mg  daily -Resume ASA 81mg  (can take every other day if bruising is a continued issue) -Discussed that exercise is important to prevent PAD from worsening -Continue aggressive management of DMII and HTN  #DMII: -Continue metformin,  insulin and farxiga  #HTN: Well controlled. -Continue lisinopril 5mg  daily  #HLD: -Continue crestor 20mg  daily  Medication Adjustments/Labs and Tests Ordered: Current medicines are reviewed at length with the patient today.  Concerns regarding medicines are outlined above.  No orders of the defined types were placed in this encounter.  No orders of the defined types were placed in this encounter.   There are no Patient Instructions on file for this visit.    Signed, Freada Bergeron, MD  09/26/2021 12:54 PM    [place]

## 2021-09-28 ENCOUNTER — Encounter: Payer: Self-pay | Admitting: Cardiology

## 2021-09-28 MED ORDER — LINZESS 290 MCG PO CAPS: 290.0000 ug | ORAL_CAPSULE | Freq: Every day | ORAL | 0 refills | Status: DC

## 2021-10-05 ENCOUNTER — Ambulatory Visit: Payer: Medicare HMO | Admitting: Podiatry

## 2021-10-05 ENCOUNTER — Encounter: Payer: Self-pay | Admitting: Cardiology

## 2021-10-05 ENCOUNTER — Encounter: Payer: Self-pay | Admitting: Podiatry

## 2021-10-05 ENCOUNTER — Other Ambulatory Visit: Payer: Self-pay | Admitting: Internal Medicine

## 2021-10-05 ENCOUNTER — Other Ambulatory Visit: Payer: Self-pay

## 2021-10-05 DIAGNOSIS — M79675 Pain in left toe(s): Secondary | ICD-10-CM | POA: Diagnosis not present

## 2021-10-05 DIAGNOSIS — B351 Tinea unguium: Secondary | ICD-10-CM

## 2021-10-05 DIAGNOSIS — M79674 Pain in right toe(s): Secondary | ICD-10-CM | POA: Diagnosis not present

## 2021-10-05 NOTE — Telephone Encounter (Signed)
Patient states that she will call us next to reschedule. States she is not in a position to come down to the office right now.

## 2021-10-05 NOTE — Progress Notes (Addendum)
This patient returns to my office for at risk foot care.  This patient requires this care by a professional since this patient will be at risk due to having  PAD and type 2 diabetes.  This patient is unable to cut nails herself since the patient cannot reach hernails.These nails are painful walking and wearing shoes.  This patient presents for at risk foot care today.  General Appearance  Alert, conversant and in no acute stress.  Vascular  Dorsalis pedis and posterior tibial  pulses are  weakly  palpable  bilaterally.  Capillary return is within normal limits  bilaterally. Temperature is within normal limits  bilaterally.  Neurologic  Senn-Weinstein monofilament wire test within normal limits  bilaterally. Muscle power within normal limits bilaterally.  Nails Thick disfigured discolored nails with subungual debris  hallux nails bilateral. No evidence of bacterial infection or drainage bilaterally.  Orthopedic  No limitations of motion  feet .  No crepitus or effusions noted.  No bony pathology or digital deformities noted.  Skin  normotropic skin with no porokeratosis noted bilaterally.  No signs of infections or ulcers noted.     Onychomycosis  Pain in right toes  Pain in left toes  Consent was obtained for treatment procedures.   Mechanical debridement of nails 1-5  bilaterally performed with a nail nipper.  Filed with dremel without incident.    Return office visit      prn               Told patient to return for periodic foot care and evaluation due to potential at risk complications.   Gardiner Barefoot DPM

## 2021-10-06 ENCOUNTER — Ambulatory Visit: Payer: Medicare HMO | Admitting: Cardiology

## 2021-10-14 ENCOUNTER — Other Ambulatory Visit: Payer: Self-pay | Admitting: Medical

## 2021-10-14 ENCOUNTER — Telehealth: Payer: Self-pay

## 2021-10-14 MED ORDER — FARXIGA 10 MG PO TABS: 10.0000 mg | ORAL_TABLET | Freq: Every day | ORAL | 3 refills | Status: DC

## 2021-10-14 MED ORDER — LEVEMIR FLEXTOUCH 100 UNIT/ML ~~LOC~~ SOPN: 40.0000 [IU] | PEN_INJECTOR | Freq: Every day | SUBCUTANEOUS | 2 refills | Status: DC

## 2021-10-14 NOTE — Telephone Encounter (Signed)
My understanding is [place] are auto [place] for next year, so I just need printed Rx for [place] for #90 with 3 refills

## 2021-10-14 NOTE — Telephone Encounter (Signed)
Pt states she prefers the Levemir over the [place], said Basaglar harder to push & they are spongy.  She was approved last year for both Patient Assistance Programs.  Are you ok with her staying on Levemir?  And her dose was 40 unites into skin daily, is this still correct?

## 2021-10-15 ENCOUNTER — Encounter: Payer: Self-pay | Admitting: Family Medicine

## 2021-10-15 ENCOUNTER — Other Ambulatory Visit: Payer: Self-pay

## 2021-10-15 ENCOUNTER — Ambulatory Visit (INDEPENDENT_AMBULATORY_CARE_PROVIDER_SITE_OTHER): Payer: Medicare HMO | Admitting: Family Medicine

## 2021-10-15 VITALS — BP 122/62 | HR 60 | Temp 97.3°F | Wt 170.2 lb

## 2021-10-15 DIAGNOSIS — N3000 Acute cystitis without hematuria: Secondary | ICD-10-CM

## 2021-10-15 DIAGNOSIS — Z87440 Personal history of urinary (tract) infections: Secondary | ICD-10-CM | POA: Diagnosis not present

## 2021-10-15 LAB — POCT URINALYSIS DIP (CLINITEK)
Bilirubin, UA: NEGATIVE
Blood, UA: NEGATIVE
Glucose, UA: >=1000 mg/dL — AB
Ketones, POC UA: NEGATIVE mg/dL
Leukocytes, UA: NEGATIVE
Nitrite, UA: POSITIVE — AB
POC PROTEIN,UA: NEGATIVE
Spec Grav, UA: 1.015 (ref 1.010–1.025)
Urobilinogen, UA: 0.2 E.U./dL
pH, UA: 6 (ref 5.0–8.0)

## 2021-10-15 MED ORDER — SULFAMETHOXAZOLE-TRIMETHOPRIM 800-160 MG PO TABS: 1.0000 | ORAL_TABLET | Freq: Two times a day (BID) | ORAL | 0 refills | Status: DC

## 2021-10-15 NOTE — Telephone Encounter (Signed)
Faxed Rx refills for 2023 Farxiga to [facility]

## 2021-10-15 NOTE — Telephone Encounter (Signed)
PT ASSISTANCE LEVEMIR application completed and faxed

## 2021-10-15 NOTE — Progress Notes (Signed)
Chief Complaint  Patient presents with   Dysuria    With pelvic pressure and left flank pain since yesterday   Patient presents with complaint of pressure in her lower abdomen, and increased urinary frequency since yesterday. Denies hematuria.  Has an odor, thinks it was a little cloudy today.  No f/c, no n/v/d No vaginal discharge or bleeding Denies sexual activity  She has long-standing discomfort at her L flank area--has had discomfort she was a teenager. She denies change in this discomfort, sometimes extends to the L abdomen, but also might be related to her bowels. Constipation is controlled with her Linzess.  She has h/o recurrent UTI's, and is under the care of urologist.  She is takes trimethoprim daily. She seems frustrated with the urology encounters, as she isn't examined, just has a urine check and medication refilled.  She reports hoping that she would get some sort of fix for her bladder, perhaps a tack or other procedure. She denies any known prolapse.    Review of chart shows last urine culture in epic was 12/2020 E.coli --sensitive to all but ampicillin. She thinks she was treated for a UTI in July as well.   PMH, PSH, SH reviewed DM Lab Results  Component Value Date   HGBA1C 6.8 (A) 07/21/2021    Outpatient Encounter Medications as of 10/15/2021  Medication Sig   aspirin EC 81 MG tablet Take 1 tablet (81 mg total) by mouth every other day. Swallow whole.   FARXIGA 10 MG TABS tablet Take 1 tablet (10 mg total) by mouth daily.   insulin detemir (LEVEMIR FLEXTOUCH) 100 UNIT/ML FlexPen Inject 40 Units into the skin daily.   LINZESS 290 MCG CAPS capsule Take 1 capsule (290 mcg total) by mouth daily. Samples of this drug were given to the patient, quantity 24, Lot Number [id number] exp 11/2021   lisinopril (ZESTRIL) 5 MG tablet Take 1 tablet (5 mg total) by mouth daily.   oxybutynin (DITROPAN) 5 MG tablet Take 1 tablet (5 mg total) by mouth 2 (two) times daily.    pantoprazole (PROTONIX) 40 MG tablet Take 1 tablet (40 mg total) by mouth every morning.   rosuvastatin (CRESTOR) 20 MG tablet TAKE 1 TABLET BY MOUTH EVERY DAY   trimethoprim (TRIMPEX) 100 MG tablet Take 100 mg by mouth daily.   vitamin B-6 (PYRIDOXINE) 25 MG tablet Take 1 tablet (25 mg total) by mouth daily.   Insulin Pen Needle (B-D UF III MINI PEN NEEDLES) 31G X 5 MM MISC USE ONCE DAILY   olopatadine (PATADAY) 0.1 % ophthalmic solution Place 1 drop into both eyes 2 (two) times daily.   ONETOUCH ULTRA test strip USE AS DIRECTED TO TEST BLOOD SUGARS TWICE A DAY   No facility-administered encounter medications on file as of 10/15/2021.   No Known Allergies   ROS: no fever, chills, n/v/d.  No hematuria, vaginal discharge, rashes, URI symptoms, chest pain or other concerns. See HPI.   PHYSICAL EXAM:  BP 122/62 (BP Location: Left Arm, Patient Position: Sitting)    Pulse 60    Temp (!) 97.3 F (36.3 C) (Tympanic)    Wt 170 lb 3.2 oz (77.2 kg)    SpO2 97%    BMI 33.24 kg/m   Elderly female, in no distress. HEENT: conjunctiva and sclera are clear, EOMI, wearing mask Neck: no lymphadenopathy or mass Heart: regular rate and rhythm Lungs: clear bilaterally Abdomen: soft, no masses. Mildly tender in suprapubic region. No rebound or guarding. Back: mild  L CVA tenderness Extremities: no edema Neuro: alert and oriented Psych: slightly irritable, normal mood, hygiene, grooming, speech, eye contact.  Urine dip: Glu 3+ (on Farxiga), +nitrite, SG 1.015 Neg for leuks and blood   ASSESSMENT/PLAN:  Acute cystitis without hematuria - pt on trimethoprim daily; to stop/hold while on Bactrim. urine culture sent. - Plan: POCT URINALYSIS DIP (CLINITEK), Urine Culture, sulfamethoxazole-trimethoprim (BACTRIM DS) 800-160 MG tablet  History of recurrent UTIs  She was very frustrated with urology visit, feeling like she wasn't getting anything from those visits.  She clearly has a long history of flank  pain, and frequent infections.   Due to her concerns, spent a lot more time reviewing chart, urology notes (did have exam when initially evaluated, not with f/u exams). Reviewed prior cultures. Patient is on [place] for DM, which can increase risk for infections, but it doesn't appear that they have really been all that frequent to warrant change.  Treating for UTI with Bactrim, culture sent.  I spent 30 minutes dedicated to the care of this patient, including pre-visit review of records, face to face time, post-visit ordering of testing and documentation.   Take the antibiotics as directed. Stay well hydrated and try and avoid holding your urine for long periods. Please contact us if you develop fever, vomiting, blood in the urine or other new concerns. The urine is being sent for culture, which may take a week for the final result. If you are not completely better by Monday, please contact us (usually bladder infections improve within 48 hours).

## 2021-10-15 NOTE — Patient Instructions (Addendum)
°  Take the antibiotics as directed (twice daily for a week). STOP taking the trimethoprim for the week that you take this new antibiotic. You may resume the trimethoprim once you complete the new antibiotic.  Stay well hydrated and try and avoid holding your urine for long periods. Please contact us if you develop fever, vomiting, blood in the urine or other new concerns. The urine is being sent for culture, which may take a week for the final result. If you are not completely better by Monday, please contact us (usually bladder infections improve within 48 hours).

## 2021-10-15 NOTE — Telephone Encounter (Signed)
Pt Assistance medication Levemir & Novofine needles received

## 2021-10-19 DIAGNOSIS — L570 Actinic keratosis: Secondary | ICD-10-CM | POA: Diagnosis not present

## 2021-10-19 DIAGNOSIS — L400 Psoriasis vulgaris: Secondary | ICD-10-CM | POA: Diagnosis not present

## 2021-10-19 DIAGNOSIS — D485 Neoplasm of uncertain behavior of skin: Secondary | ICD-10-CM | POA: Diagnosis not present

## 2021-10-19 DIAGNOSIS — C44311 Basal cell carcinoma of skin of nose: Secondary | ICD-10-CM | POA: Diagnosis not present

## 2021-10-21 LAB — URINE CULTURE

## 2021-10-22 ENCOUNTER — Telehealth: Payer: Self-pay

## 2021-10-22 NOTE — Telephone Encounter (Signed)
Pt is scheduled for tomorrow 

## 2021-10-22 NOTE — Telephone Encounter (Signed)
Pt. Called stating she is still having some pain for an UTI when she saw you last week. She stats she just feels bad still and wanted to know if she needed another round of medication or needed to come in again to recheck urine tomorrow.

## 2021-10-22 NOTE — Telephone Encounter (Signed)
If she isn't feeling better, she should see her PCP tomorrow for recheck

## 2021-10-23 ENCOUNTER — Ambulatory Visit (INDEPENDENT_AMBULATORY_CARE_PROVIDER_SITE_OTHER): Payer: Medicare HMO | Admitting: Medical

## 2021-10-23 ENCOUNTER — Other Ambulatory Visit: Payer: Self-pay

## 2021-10-23 ENCOUNTER — Encounter: Payer: Self-pay | Admitting: Medical

## 2021-10-23 VITALS — BP 132/82 | HR 82 | Temp 97.5°F | Wt 168.0 lb

## 2021-10-23 DIAGNOSIS — R300 Dysuria: Secondary | ICD-10-CM | POA: Diagnosis not present

## 2021-10-23 LAB — POCT URINALYSIS DIP (PROADVANTAGE DEVICE)
Bilirubin, UA: NEGATIVE
Glucose, UA: 500 mg/dL — AB
Ketones, POC UA: NEGATIVE mg/dL
Leukocytes, UA: NEGATIVE
Nitrite, UA: NEGATIVE
Protein Ur, POC: NEGATIVE mg/dL
Specific Gravity, Urine: 1.015
Urobilinogen, Ur: NEGATIVE
pH, UA: 6 (ref 5.0–8.0)

## 2021-10-23 MED ORDER — CIPROFLOXACIN HCL 500 MG PO TABS: 500.0000 mg | ORAL_TABLET | Freq: Two times a day (BID) | ORAL | 0 refills | Status: AC
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
## 2021-10-27 NOTE — Telephone Encounter (Signed)
[facility] and scheduled with NP Virgia Land NP for 11/03/21 @ 1 pm , Called patient and notified of appointment she states this is fine and will be more comfortable talking to a woman about concern

## 2021-10-29 NOTE — Addendum Note (Signed)
Addended by: Minette Headland A on: 10/29/2021 12:57 PM   Modules accepted: Orders

## 2021-11-03 DIAGNOSIS — R300 Dysuria: Secondary | ICD-10-CM | POA: Diagnosis not present

## 2021-11-03 DIAGNOSIS — N952 Postmenopausal atrophic vaginitis: Secondary | ICD-10-CM | POA: Diagnosis not present

## 2021-11-05 ENCOUNTER — Encounter: Payer: Self-pay | Admitting: Gastroenterology

## 2021-11-05 ENCOUNTER — Telehealth: Payer: Self-pay | Admitting: Medical

## 2021-11-05 NOTE — Telephone Encounter (Signed)
Pt wants to know if she can go back on metformin.

## 2021-11-05 NOTE — Telephone Encounter (Signed)
I reviewed her urology notes today.  They recommended she try being off the [place] for a while as this may be aggravating the urinary vaginal issue.  Is she agreeable to trying an alternative medication for now and not using this type of medicine for the time being

## 2021-11-06 ENCOUNTER — Other Ambulatory Visit: Payer: Self-pay | Admitting: Medical

## 2021-11-06 ENCOUNTER — Encounter: Payer: Self-pay | Admitting: Gastroenterology

## 2021-11-06 MED ORDER — METFORMIN HCL 500 MG PO TABS: 500.0000 mg | ORAL_TABLET | Freq: Two times a day (BID) | ORAL | 1 refills | Status: DC

## 2021-11-06 NOTE — Telephone Encounter (Signed)
Pt was notified.  

## 2021-11-09 ENCOUNTER — Encounter: Payer: Self-pay | Admitting: Gastroenterology

## 2021-11-10 NOTE — Telephone Encounter (Signed)
Called pt to f/u on bowel purge and rectal concerns. States she did complete the bowel purge 11/09/21 and was able to have 2 large stools without the need to strain following the purge. Denies any rectal bleeding or abd pain. Still having occasional episodes of gas. Confirmed she is still taking Linzess and Miralax daily as prescribed. No further concerns voiced at this time. Advised she remain hydrated with 64 ounces or more of water per day. Advised she call if she is unable to have a large soft BM today or tomorrow. Verbalized acceptance and understanding.

## 2021-11-11 ENCOUNTER — Ambulatory Visit: Payer: Medicare HMO | Admitting: Nurse Practitioner

## 2021-11-11 ENCOUNTER — Encounter: Payer: Self-pay | Admitting: Gastroenterology

## 2021-11-24 ENCOUNTER — Encounter: Payer: Self-pay | Admitting: Gastroenterology

## 2021-11-24 ENCOUNTER — Ambulatory Visit: Payer: Medicare HMO | Admitting: Nurse Practitioner

## 2021-11-24 ENCOUNTER — Encounter: Payer: Self-pay | Admitting: Nurse Practitioner

## 2021-11-24 VITALS — BP 114/54 | HR 84 | Ht 60.5 in | Wt 169.1 lb

## 2021-11-24 DIAGNOSIS — K5900 Constipation, unspecified: Secondary | ICD-10-CM

## 2021-11-24 NOTE — Progress Notes (Signed)
ASSESSMENT AND PLAN    # 81 yo female with chronic constipation characterized by decreased frequency / urge --First, explained to patient that she doesn't necessarily have to have a BM everyday. A BM every other day is acceptable.   --Adding Miralax to Linzess 290 mcg has helped but Linzess is costly. Recommend she take Miralax every day and try to decrease Linzess to once every other day t save money. Samples of Linzess given --She will start the fiber supplement that she already has on hand --Drink at least 48 oz of water daily.  --Call if not improving or if has blood in stool --She is very worried about colon cancer but doesn't want a colonoscopy. She asks about an MRI. I explained that MRIs are not a indicated for detection of colon cancer.    HISTORY OF PRESENT ILLNESS    Chief Complaint : constipation  MALINI FLEMINGS is a 81 y.o. female with a past medical history of diabetes , anxiety , depression , IBS  / chronic constipation , GERD, colon polyps, diverticulosis.  Additional medical history as listed in [place]   Patient is known to Dr.  Tarri Glenn.   07/14/21 last office visit- evaluation of GERD and chronic constipation. Linzess helping but was cost issue. Plan was to continue Linzess, samples given, daily MiraLAX added.  Started on pantoprazole reflux  11/05/21 patient called office with concerns about feeling like something was in her rectum. Given an appt to see me but she was unable to make it. t  Miralax bowel purge recommended in the interim. Appointment changed to 11/24/21.   11/09/21 patient called office. Completed bowel purge, had two large BMs. Felt better. Asked to continue MiraLAX and Linzess and increased water intake.   INTERVAL HISTORY: After her September visit she did take the recommended Miralax for a while. She stopped it at some point and did okay until when she started calling the office with recurrent constipation. She has since been having pain in rectum. Having  a BM does help with the rectal discomfort. She is unsure if she has had any blood in her stool.    LABORATORY DATA  Hepatic Function Latest Ref Rng & Units 04/16/2021 12/02/2020 04/07/2020  Total Protein 6.0 - 8.5 g/dL 6.8 6.5 6.8  Albumin 3.6 - 4.6 g/dL 4.6 4.6 4.7(H)  AST 0 - 40 IU/L 11 12 16   ALT 0 - 32 IU/L 16 18 20   Alk Phosphatase 44 - 121 IU/L 95 98 105  Total Bilirubin 0.0 - 1.2 mg/dL 0.3 0.3 0.3  Bilirubin, Direct 0.0 - 0.3 mg/dL - - -    CBC Latest Ref Rng & Units 12/02/2020 10/11/2019 12/19/2018  WBC 3.4 - 10.8 x10E3/uL 5.8 6.1 6.3  Hemoglobin 11.1 - 15.9 g/dL 14.4 14.6 14.1  Hematocrit 34.0 - 46.6 % 42.0 43.1 40.9  Platelets 150 - 450 x10E3/uL 154 183 185    PREVIOUS ENDOSCOPIC EVALUATIONS    Colonoscopy 03/24/1999 was normal except for diverticulosis and small hemorrhoids.  - Colonoscopy with Dr. Collene Mares 04/23/2005 showed small internal hemorrhoids, sigmoid diverticulosis, and no polyps. - Colonoscopy 08/31/2010 which showed scattered diverticulosis, hyperplastic polyps from the rectosigmoid colon, and a descending colon tubular adenoma   Current Medications, Allergies, Past Medical History, Past Surgical History, Family History and Social History were reviewed in [employer] record.     Current Outpatient Medications  Medication Sig Dispense Refill   aspirin EC 81 MG tablet Take 1 tablet (81  mg total) by mouth every other day. Swallow whole. 90 tablet 3   insulin detemir (LEVEMIR FLEXTOUCH) 100 UNIT/ML FlexPen Inject 40 Units into the skin daily. 15 mL 2   Insulin Pen Needle (B-D UF III MINI PEN NEEDLES) 31G X 5 MM MISC USE ONCE DAILY 100 each 1   LINZESS 290 MCG CAPS capsule Take 1 capsule (290 mcg total) by mouth daily. Samples of this drug were given to the patient, quantity 24, Lot Number [id number] exp 11/2021 24 capsule 0   lisinopril (ZESTRIL) 5 MG tablet Take 1 tablet (5 mg total) by mouth daily. 90 tablet 3   metFORMIN (GLUCOPHAGE) 500 MG tablet Take  1 tablet (500 mg total) by mouth 2 (two) times daily with a meal. 180 tablet 1   ONETOUCH ULTRA test strip USE AS DIRECTED TO TEST BLOOD SUGARS TWICE A DAY 200 strip 1   oxybutynin (DITROPAN) 5 MG tablet Take 1 tablet (5 mg total) by mouth 2 (two) times daily. 180 tablet 3   pantoprazole (PROTONIX) 40 MG tablet Take 1 tablet (40 mg total) by mouth every morning. 90 tablet 3   rosuvastatin (CRESTOR) 20 MG tablet TAKE 1 TABLET BY MOUTH EVERY DAY 90 tablet 3   vitamin B-6 (PYRIDOXINE) 25 MG tablet Take 1 tablet (25 mg total) by mouth daily. 90 tablet 0   No current facility-administered medications for this visit.    Review of Systems: No chest pain. No shortness of breath. No urinary complaints.   PHYSICAL EXAM :    Wt Readings from Last 3 Encounters:  11/24/21 169 lb 2 oz (76.7 kg)  10/23/21 168 lb (76.2 kg)  10/15/21 170 lb 3.2 oz (77.2 kg)    Ht 5' 0.5" (1.537 m) Comment: height measured without shoes   Wt 169 lb 2 oz (76.7 kg)    BMI 32.49 kg/m  Constitutional:  Generally well appearing female in no acute distress. Psychiatric: Pleasant. Normal mood and affect. Behavior is normal. EENT: Pupils normal.  Conjunctivae are normal. No scleral icterus. Neck supple.  Cardiovascular: Normal rate, regular rhythm. No edema Pulmonary/chest: Effort normal and breath sounds normal. No wheezing, rales or rhonchi. Abdominal: Soft, nondistended, nontender. Bowel sounds active throughout. There are no masses palpable. No hepatomegaly. Rectal: Small amount of external hemorrhoidal tissue. No lesions felt on DRE. Anoscopy inserted but couldn't visualize anything as brown, soft stool was coming out Neurological: Alert and oriented to person place and time. Skin: Skin is warm and dry. No rashes noted.  Tye Savoy, NP  11/24/2021, 2:08 PM

## 2021-11-24 NOTE — Patient Instructions (Addendum)
Please start the daily fiber supplement that you have at home Please try to drink at least 48 oz of water daily Miralax 1 capful in 8 oz water daily Linzess 290 mcg every other day Call us if things are not improving and / or if you see blood in stool   It was great seeing you today! Thank you for entrusting me with your care and choosing [facility].  Tye Savoy, NP  The [facility] providers would like to encourage you to use [facility] to communicate with providers for non-urgent requests or questions.  Due to long hold times on the telephone, sending your provider a message by [facility] may be faster and more efficient way to get a response. Please allow 48 business hours for a response.  Please remember that this is for non-urgent requests/questions.  If you are age 67 or older, your body mass index should be between 23-30. Your Body mass index is 32.49 kg/m. If this is out of the aforementioned range listed, please consider follow up with your Primary Care Provider.  If you are age 46 or younger, your body mass index should be between 19-25. Your Body mass index is 32.49 kg/m. If this is out of the aformentioned range listed, please consider follow up with your Primary Care Provider.

## 2021-11-24 NOTE — Telephone Encounter (Signed)
Patient given samples today.

## 2021-11-25 NOTE — Progress Notes (Signed)
Reviewed and agree with management plans. ? ?Callie Bunyard L. Tekeshia Klahr, MD, MPH  ?

## 2021-12-03 ENCOUNTER — Encounter: Payer: Self-pay | Admitting: Gastroenterology

## 2021-12-08 DIAGNOSIS — L570 Actinic keratosis: Secondary | ICD-10-CM | POA: Diagnosis not present

## 2021-12-08 DIAGNOSIS — C44311 Basal cell carcinoma of skin of nose: Secondary | ICD-10-CM | POA: Diagnosis not present

## 2021-12-16 ENCOUNTER — Encounter: Payer: Self-pay | Admitting: Internal Medicine

## 2021-12-18 ENCOUNTER — Telehealth: Payer: Self-pay | Admitting: Medical

## 2021-12-18 NOTE — Telephone Encounter (Signed)
Left message for patient to call back and schedule Medicare Annual Wellness Visit (AWV) either virtually or in office. I left my number for patient to call [phone].  Last AWV ;04/15/22  please schedule at anytime with health coach  This should be a 45 minute visit.    Awv can be scheduled calendar year [place]

## 2021-12-21 ENCOUNTER — Other Ambulatory Visit: Payer: Self-pay | Admitting: Medical

## 2021-12-21 DIAGNOSIS — L820 Inflamed seborrheic keratosis: Secondary | ICD-10-CM | POA: Diagnosis not present

## 2021-12-21 DIAGNOSIS — L538 Other specified erythematous conditions: Secondary | ICD-10-CM | POA: Diagnosis not present

## 2021-12-21 DIAGNOSIS — D225 Melanocytic nevi of trunk: Secondary | ICD-10-CM | POA: Diagnosis not present

## 2021-12-21 DIAGNOSIS — Z08 Encounter for follow-up examination after completed treatment for malignant neoplasm: Secondary | ICD-10-CM | POA: Diagnosis not present

## 2021-12-21 DIAGNOSIS — L400 Psoriasis vulgaris: Secondary | ICD-10-CM | POA: Diagnosis not present

## 2021-12-21 DIAGNOSIS — K5900 Constipation, unspecified: Secondary | ICD-10-CM

## 2021-12-21 DIAGNOSIS — L570 Actinic keratosis: Secondary | ICD-10-CM | POA: Diagnosis not present

## 2021-12-21 DIAGNOSIS — L821 Other seborrheic keratosis: Secondary | ICD-10-CM | POA: Diagnosis not present

## 2021-12-21 DIAGNOSIS — L814 Other melanin hyperpigmentation: Secondary | ICD-10-CM | POA: Diagnosis not present

## 2021-12-21 DIAGNOSIS — Z85828 Personal history of other malignant neoplasm of skin: Secondary | ICD-10-CM | POA: Diagnosis not present

## 2021-12-28 NOTE — Telephone Encounter (Signed)
Pt Assistance medication Levemir received, pt informed

## 2022-01-12 NOTE — Telephone Encounter (Signed)
Needles received, I called pt and she doesn't need at this time

## 2022-01-23 ENCOUNTER — Other Ambulatory Visit: Payer: Self-pay | Admitting: Medical

## 2022-01-23 DIAGNOSIS — E1169 Type 2 diabetes mellitus with other specified complication: Secondary | ICD-10-CM

## 2022-01-23 DIAGNOSIS — E118 Type 2 diabetes mellitus with unspecified complications: Secondary | ICD-10-CM

## 2022-01-29 ENCOUNTER — Ambulatory Visit: Payer: Medicare HMO | Admitting: Medical

## 2022-02-02 ENCOUNTER — Telehealth (INDEPENDENT_AMBULATORY_CARE_PROVIDER_SITE_OTHER): Payer: Medicare HMO | Admitting: Medical

## 2022-02-02 VITALS — Wt 165.0 lb

## 2022-02-02 DIAGNOSIS — Z794 Long term (current) use of insulin: Secondary | ICD-10-CM | POA: Diagnosis not present

## 2022-02-02 DIAGNOSIS — E785 Hyperlipidemia, unspecified: Secondary | ICD-10-CM

## 2022-02-02 DIAGNOSIS — M810 Age-related osteoporosis without current pathological fracture: Secondary | ICD-10-CM

## 2022-02-02 DIAGNOSIS — N3281 Overactive bladder: Secondary | ICD-10-CM

## 2022-02-02 DIAGNOSIS — K219 Gastro-esophageal reflux disease without esophagitis: Secondary | ICD-10-CM | POA: Diagnosis not present

## 2022-02-02 DIAGNOSIS — Z87440 Personal history of urinary (tract) infections: Secondary | ICD-10-CM | POA: Diagnosis not present

## 2022-02-02 DIAGNOSIS — K5900 Constipation, unspecified: Secondary | ICD-10-CM | POA: Diagnosis not present

## 2022-02-02 DIAGNOSIS — R32 Unspecified urinary incontinence: Secondary | ICD-10-CM | POA: Diagnosis not present

## 2022-02-02 DIAGNOSIS — E1169 Type 2 diabetes mellitus with other specified complication: Secondary | ICD-10-CM | POA: Diagnosis not present

## 2022-02-02 NOTE — Progress Notes (Signed)
?Subjective:  ?  ? Patient ID: Marissa Oliver, female   DOB: 07/11/1941, 81 y.o.   MRN: [id number] ? ?This visit type was conducted due to national recommendations for restrictions regarding the COVID-19 Pandemic (e.g. social distancing) in an effort to limit this patient's exposure and mitigate transmission in our community.  Due to their co-morbid illnesses, this patient is at least at moderate risk for complications without adequate follow up.  This format is felt to be most appropriate for this patient at this time.   ? ?Documentation for virtual audio and video telecommunications through [place] encounter: ? ?The patient was located at home. ?The provider was located in the office. ?The patient did consent to this visit and is aware of possible charges through their insurance for this visit. ? ?The other persons participating in this telemedicine service were none. ?Time spent on call was 20 minutes and in review of previous records 20 minutes total. ? ?This virtual service is not related to other E/M service within previous 7 days. ? ? ?HPI ?Chief Complaint  ?Patient presents with  ? diabetes  ?  Diabetes, some pain in groin  ?  ? ?She notes some pains in groin, worse over weekend but has improved.  Then last night felt a litte pain again.  Thinks she may have pulled a muscle recently.   She is having to do more around house as her brother who lives there recently had hospitlaition and has limited mobility currently.   No diarrhea, no bloody stool, no nausea or vomiting.  No urinary changes.  No polyuria, no urinary frequency, no hematuria. ? ?Diabetes-compliant with Levemir 40 units daily, metformin 500 mg twice daily.  No recent concerns.  No foot concerns.  Doing fine in this regard.  She quit [place] several months ago due to recurrent urinary infections. ? ?Hyperlipidemia-compliant with Crestor without complaint ? ?Compliant with rest of medicines without complaint. ? ?Overactive bladder-on oxybutynin.   Has follow-up with urology soon. ? ?Compliant with lisinopril 5 mg daily ? ?Past Medical History:  ?Diagnosis Date  ? Atrophic vaginitis   ? estrace cream rx'd by Dr. Karsten Ro  ? Colon polyp   ? tubular adenoma (colonoscopy q5 yrs)  ? Diabetes mellitus 2005  ? Diverticulosis   ? Dyslipidemia   ? GAD (generalized anxiety disorder)   ? Hemorrhoids   ? Hyperlipidemia   ? OAB (overactive bladder)   ? Osteoporosis   ? Psoriasis   ? mild, sees [facility]  ? Smoker   ? ?Current Outpatient Medications on File Prior to Visit  ?Medication Sig Dispense Refill  ? aspirin EC 81 MG tablet Take 1 tablet (81 mg total) by mouth every other day. Swallow whole. 90 tablet 3  ? Fiber POWD Take 1 Dose by mouth as needed.    ? insulin detemir (LEVEMIR FLEXTOUCH) 100 UNIT/ML FlexPen Inject 40 Units into the skin daily. 15 mL 2  ? LINZESS 290 MCG CAPS capsule Take 1 capsule (290 mcg total) by mouth daily. Samples of this drug were given to the patient, quantity 24, Lot Number [id number] exp 11/2021 24 capsule 0  ? lisinopril (ZESTRIL) 5 MG tablet TAKE ONE TABLET BY MOUTH DAILY 90 tablet 3  ? metFORMIN (GLUCOPHAGE) 500 MG tablet Take 1 tablet (500 mg total) by mouth 2 (two) times daily with a meal. 180 tablet 1  ? oxybutynin (DITROPAN) 5 MG tablet Take 1 tablet (5 mg total) by mouth 2 (two) times daily. 180 tablet 3  ?  pantoprazole (PROTONIX) 40 MG tablet Take 1 tablet (40 mg total) by mouth every morning. 90 tablet 3  ? polyethylene glycol powder (GLYCOLAX/MIRALAX) 17 GM/SCOOP powder Take 17 g by mouth daily.    ? rosuvastatin (CRESTOR) 20 MG tablet TAKE 1 TABLET BY MOUTH EVERY DAY 90 tablet 3  ? vitamin B-6 (PYRIDOXINE) 25 MG tablet Take 1 tablet (25 mg total) by mouth daily. 90 tablet 0  ? Insulin Pen Needle (B-D UF III MINI PEN NEEDLES) 31G X 5 MM MISC USE ONCE DAILY 100 each 1  ? ONETOUCH ULTRA test strip USE AS DIRECTED TO TEST BLOOD SUGAR TWO TIMES A DAY 50 strip 1  ? ?No current facility-administered medications on file prior to  visit.  ? ?Review of systems ?As in subjective ?   ?Objective:  ? Physical Exam ?Due to coronavirus pandemic stay at home measures, patient visit was virtual and they were not examined in person.   ? ?Gen: nad ?   ?Assessment:  ?   ?Encounter Diagnoses  ?Name Primary?  ? Type 2 diabetes mellitus with other specified complication, with long-term current use of insulin ([place]) Yes  ? Dyslipidemia associated with type 2 diabetes mellitus ([place])   ? Urinary incontinence, unspecified type   ? Overactive bladder   ? History of recurrent UTIs   ? Constipation, unspecified constipation type   ? Gastroesophageal reflux disease, unspecified whether esophagitis present   ? Osteoporosis, unspecified osteoporosis type, unspecified pathological fracture presence   ? ? ? ?   ?Plan:  ?   ?Diabetes-she will come in tomorrow for updated hemoglobin A1c.  Continue basal insulin and metformin.  She was on Farxiga before but was having too many problems with urinary tract infections so this was discontinued.  ? ?Dyslipidemia associated with diabetes-continue statin.  Last labs less than 1 year ago at goal ? ?Overactive bladder, urinary incontinence, history of recurrent UTI-continue oxybutynin.  Follow-up with urology as planned ? ?Groin pain-likely a pulled muscle.  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  Advise relative rest, no heavy lifting, stretch throughout the day.  If not improving over the next few days then recheck in person ? ?Continue lisinopril for renal protection ? ?Constipation-doing fine on Linzess ? ?GERD-doing fine on Protonix ? ?Osteoporosis-I reviewed her last bone density scan from February 2022.  At that time she had completed use of Fosamax.  We were trying to get a different medication approved without success.  I will check with my office about whether Prolia ever got approved.  We discussed referring to rheumatology if we did not have success  with Prolia or other medication approval ? ?Marissa Oliver was seen today for diabetes. ? ?Diagnoses and all orders for this visit: ? ?Type 2 diabetes mellitus with other specified complication, with long-term current use of insulin ([place]) ?-     Hemoglobin A1c; Future ? ?Dyslipidemia associated with type 2 diabetes mellitus ([place]) ? ?Urinary incontinence, unspecified type ?-     CBC; Future ? ?Overactive bladder ? ?History of recurrent UTIs ?-     CBC; Future ? ?Constipation, unspecified constipation type ? ?Gastroesophageal reflux disease, unspecified whether esophagitis present ? ?Osteoporosis, unspecified osteoporosis type, unspecified pathological fracture presence ? ? ?Follow-up tomorrow for fasting labs ?

## 2022-02-03 ENCOUNTER — Other Ambulatory Visit: Payer: Medicare HMO

## 2022-02-03 ENCOUNTER — Other Ambulatory Visit: Payer: Self-pay | Admitting: Medical

## 2022-02-03 DIAGNOSIS — R32 Unspecified urinary incontinence: Secondary | ICD-10-CM

## 2022-02-03 DIAGNOSIS — M810 Age-related osteoporosis without current pathological fracture: Secondary | ICD-10-CM

## 2022-02-03 DIAGNOSIS — Z87440 Personal history of urinary (tract) infections: Secondary | ICD-10-CM | POA: Diagnosis not present

## 2022-02-03 DIAGNOSIS — E1169 Type 2 diabetes mellitus with other specified complication: Secondary | ICD-10-CM

## 2022-02-03 DIAGNOSIS — Z794 Long term (current) use of insulin: Secondary | ICD-10-CM | POA: Diagnosis not present

## 2022-02-03 NOTE — Progress Notes (Signed)
refer

## 2022-02-04 ENCOUNTER — Telehealth: Payer: Self-pay | Admitting: Internal Medicine

## 2022-02-04 LAB — CBC
Hematocrit: 37.7 % (ref 34.0–46.6)
Hemoglobin: 12.5 g/dL (ref 11.1–15.9)
MCH: 29.6 pg (ref 26.6–33.0)
MCHC: 33.2 g/dL (ref 31.5–35.7)
MCV: 89 fL (ref 79–97)
Platelets: 181 10*3/uL (ref 150–450)
RBC: 4.23 x10E6/uL (ref 3.77–5.28)
RDW: 13.2 % (ref 11.7–15.4)
WBC: 5.6 10*3/uL (ref 3.4–10.8)

## 2022-02-04 LAB — HEMOGLOBIN A1C
Est. average glucose Bld gHb Est-mCnc: 171 mg/dL
Hgb A1c MFr Bld: 7.6 % — ABNORMAL HIGH (ref 4.8–5.6)

## 2022-02-04 MED ORDER — METFORMIN HCL 500 MG PO TABS: 500.0000 mg | ORAL_TABLET | Freq: Two times a day (BID) | ORAL | 0 refills | Status: DC

## 2022-02-04 NOTE — Telephone Encounter (Signed)
Pt insurance is now requesting for 100 days supplies instead of just 90 days ?

## 2022-02-07 ENCOUNTER — Ambulatory Visit (INDEPENDENT_AMBULATORY_CARE_PROVIDER_SITE_OTHER): Payer: Medicare HMO

## 2022-02-07 ENCOUNTER — Ambulatory Visit (HOSPITAL_COMMUNITY)
Admission: RE | Admit: 2022-02-07 | Discharge: 2022-02-07 | Disposition: A | Discharge status: Home / Self Care | Payer: Medicare HMO | Source: Ambulatory Visit | Attending: Family Medicine | Admitting: Family Medicine

## 2022-02-07 ENCOUNTER — Encounter (HOSPITAL_COMMUNITY): Payer: Self-pay

## 2022-02-07 ENCOUNTER — Other Ambulatory Visit: Payer: Self-pay

## 2022-02-07 VITALS — BP 128/79 | HR 61 | Temp 98.1°F | Resp 18

## 2022-02-07 DIAGNOSIS — M25551 Pain in right hip: Secondary | ICD-10-CM

## 2022-02-07 DIAGNOSIS — M1611 Unilateral primary osteoarthritis, right hip: Secondary | ICD-10-CM | POA: Diagnosis not present

## 2022-02-07 MED ORDER — MELOXICAM 7.5 MG PO TABS: 7.5000 mg | ORAL_TABLET | Freq: Every day | ORAL | 0 refills | Status: DC

## 2022-02-07 NOTE — ED Triage Notes (Signed)
Pt reports Rt groin pain for to weeks . Pain worse when she raises the RT leg. ?

## 2022-02-07 NOTE — Discharge Instructions (Signed)
Your x-ray does not show any fracture or broken bones.  Does show arthritis in both hips usually and it is worse in the right hip  ? ?Take meloxicam 7.5 mg--1 tab daily for arthritis.  You may also take Tylenol extra strength  500 mg--2 tabs 3-4 times daily as needed for the pain ? ? ?

## 2022-02-07 NOTE — ED Provider Notes (Signed)
[facility] ? ? ? ?CSN: [id number] ?Arrival date & time: 02/07/22  1055 ? ? ?  ? ?History   ?Chief Complaint ?Chief Complaint  ?Patient presents with  ? Back Pain  ?  been like this off and on over 2 wks - Entered by patient  ? Rt groin pain  ? ? ?HPI ?Marissa Oliver is a 81 y.o. female.  ? ? ?Back Pain ?Patient has been hurting off and on for 2 weeks in her right inguinal sometimes is rather short, and it can hurt worse when she flexes at the right hip joint, or when she bears weight.  Or chills.  No recent trauma or ? ?Past medical history is significant for diabetes osteoporosis ? ?Past Medical History:  ?Diagnosis Date  ? Atrophic vaginitis   ? estrace cream rx'd by Dr. Karsten Ro  ? Colon polyp   ? tubular adenoma (colonoscopy q5 yrs)  ? Diabetes mellitus 2005  ? Diverticulosis   ? Dyslipidemia   ? GAD (generalized anxiety disorder)   ? Hemorrhoids   ? Hyperlipidemia   ? OAB (overactive bladder)   ? Osteoporosis   ? Psoriasis   ? mild, sees [facility]  ? Smoker   ? ? ?Patient Active Problem List  ? Diagnosis Date Noted  ? Pain due to onychomycosis of toenails of both feet 10/05/2021  ? Enlarged and hypertrophic nails 07/20/2021  ? Needs flu shot 07/20/2021  ? Advanced directives, counseling/discussion 04/15/2021  ? PAD (peripheral artery disease) ([place]) 04/15/2021  ? Encounter for health maintenance examination in adult 04/15/2021  ? 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 Chronic idiopathic constipation 12/17/2020  ? Diverticulosis of colon 12/17/2020  ? Obesity 12/17/2020  ? Need for pneumococcal vaccination 12/02/2020  ? Hearing loss 12/02/2020  ? Estrogen deficiency 12/02/2020  ? Post-menopausal 12/02/2020  ? Encounter for screening for vascular disease 12/02/2020  ? Screening for heart disease 12/02/2020  ? History of recurrent UTIs 11/13/2020  ? Dyslipidemia associated with type 2 diabetes mellitus ([place]) 07/09/2020  ? Senile purpura ([place]) 04/07/2020  ? Insomnia 04/07/2020  ? Psoriasis  04/07/2020  ? Decreased pulses in feet 12/19/2018  ? Former smoker 12/09/2016  ? History of colonic polyps 04/29/2016  ? Medicare annual wellness visit, subsequent 04/29/2016  ? Screening for cancer 04/29/2016  ? Vaccine counseling 04/29/2016  ? Type 2 diabetes mellitus with other specified complication ([place]) 02/63/7858  ? Need for influenza vaccination 08/04/2015  ? Vitamin D deficiency 10/15/2014  ? Atrophic vaginitis 01/29/2014  ? Urinary incontinence 01/29/2014  ? Generalized anxiety disorder 01/29/2014  ? Overactive bladder 01/29/2014  ? Osteoporosis 08/12/2011  ? ? ?Past Surgical History:  ?Procedure Laterality Date  ? ABDOMINAL HYSTERECTOMY  1972  ? partial  ? APPENDECTOMY    ? CARDIOVASCULAR STRESS TEST  12/25/2004  ? EF 65%. OVERALL NORMAL STRESS CARDIOLITE. NO EVIDENCE  OF ISCHEMIA  ? COLONOSCOPY  08/2010  ? cologuard 01/2016 normal  ? PUBOVAGINAL SLING  04/1998  ? TUBAL LIGATION    ? ? ?OB History   ? ? Gravida  ?3  ? Para  ?3  ? Term  ?   ? Preterm  ?   ? AB  ?   ? Living  ?3  ?  ? ? SAB  ?   ? IAB  ?   ? Ectopic  ?   ? Multiple  ?   ? Live Births  ?   ?   ?  ?  ? ? ? ?  Home Medications   ? ?Prior to Admission medications   ?Medication Sig Start Date End Date Taking? Authorizing Provider  ?meloxicam (MOBIC) 7.5 MG tablet Take 1 tablet (7.5 mg total) by mouth daily. 02/07/22  Yes Barrett Henle, MD  ?aspirin EC 81 MG tablet Take 1 tablet (81 mg total) by mouth every other day. Swallow whole. 07/20/21   Tysinger, Camelia Eng, PA-C  ?Fiber POWD Take 1 Dose by mouth as needed.    [provider]  ?insulin detemir (LEVEMIR FLEXTOUCH) 100 UNIT/ML FlexPen Inject 40 Units into the skin daily. 10/14/21   Tysinger, Camelia Eng, PA-C  ?Insulin Pen Needle (B-D UF III MINI PEN NEEDLES) 31G X 5 MM MISC USE ONCE DAILY 12/29/20   Tysinger, Camelia Eng, PA-C  ?LINZESS 290 MCG CAPS capsule Take 1 capsule (290 mcg total) by mouth daily. Samples of this drug were given to the patient, quantity 24, Lot Number [id number] exp 11/2021  09/28/21   Thornton Park, MD  ?lisinopril (ZESTRIL) 5 MG tablet TAKE ONE TABLET BY MOUTH DAILY 01/25/22   Tysinger, Camelia Eng, PA-C  ?metFORMIN (GLUCOPHAGE) 500 MG tablet Take 1 tablet (500 mg total) by mouth 2 (two) times daily with a meal. 02/04/22   Tysinger, Camelia Eng, PA-C  ?ONETOUCH ULTRA test strip USE AS DIRECTED TO TEST BLOOD SUGAR TWO TIMES A DAY 01/25/22   Tysinger, Camelia Eng, PA-C  ?oxybutynin (DITROPAN) 5 MG tablet Take 1 tablet (5 mg total) by mouth 2 (two) times daily. 04/20/21   Tysinger, Camelia Eng, PA-C  ?pantoprazole (PROTONIX) 40 MG tablet Take 1 tablet (40 mg total) by mouth every morning. 07/14/21   Thornton Park, MD  ?polyethylene glycol powder (GLYCOLAX/MIRALAX) 17 GM/SCOOP powder Take 17 g by mouth daily.    [provider]  ?rosuvastatin (CRESTOR) 20 MG tablet TAKE 1 TABLET BY MOUTH EVERY DAY 04/15/21   Tysinger, Camelia Eng, PA-C  ?vitamin B-6 (PYRIDOXINE) 25 MG tablet Take 1 tablet (25 mg total) by mouth daily. 04/20/21   Tysinger, Camelia Eng, PA-C  ? ? ?Family History ?Family History  ?Problem Relation Age of Onset  ? Arthritis Mother   ? Heart failure Mother   ? Mental illness Father   ? Kidney disease Father   ? Stroke Father   ? Heart attack Father   ? Heart disease Brother   ? Cancer Brother   ?     prostate  ? Stroke Maternal Aunt   ? Cancer Maternal Uncle   ? Stroke Paternal Grandmother   ? ? ?Social History ?Social History  ? ?Tobacco Use  ? Smoking status: Former  ?  Packs/day: 0.50  ?  Types: Cigarettes  ?  Quit date: 05/22/2012  ?  Years since quitting: 9.7  ? Smokeless tobacco: Never  ?Vaping Use  ? Vaping Use: Never used  ?Substance Use Topics  ? Alcohol use: No  ?  Alcohol/week: 0.0 standard drinks  ? Drug use: No  ? ? ? ?Allergies   ?[place] [dapagliflozin] ? ? ?Review of Systems ?Review of Systems  ?Musculoskeletal:  Positive for back pain.  ? ? ?Physical Exam ?Triage Vital Signs ?ED Triage Vitals  ?Enc Vitals Group  ?   BP 02/07/22 1115 128/79  ?   Pulse Rate 02/07/22 1115 61  ?    Resp 02/07/22 1115 18  ?   Temp 02/07/22 1115 98.1 ?F (36.7 ?C)  ?   Temp src --   ?   SpO2 02/07/22 1115 94 %  ?  Weight --   ?   Height --   ?   Head Circumference --   ?   Peak Flow --   ?   Pain Score 02/07/22 1113 10  ?   Pain Loc --   ?   Pain Edu? --   ?   Excl. in [place]? --   ? ?No data found. ? ?Updated Vital Signs ?BP 128/79   Pulse 61   Temp 98.1 ?F (36.7 ?C)   Resp 18   SpO2 94%  ? ?Visual Acuity ?Right Eye Distance:   ?Left Eye Distance:   ?Bilateral Distance:   ? ?Right Eye Near:   ?Left Eye Near:    ?Bilateral Near:    ? ?Physical Exam ?Vitals reviewed.  ?Constitutional:   ?   General: She is not in acute distress. ?   Appearance: She is not toxic-appearing.  ?Musculoskeletal:     ?   General: Tenderness (right inguinal area. No swelling or erythema or nodule there) present.  ?Skin: ?   Coloration: Skin is not jaundiced or pale.  ?Neurological:  ?   Mental Status: She is alert and oriented to person, place, and time.  ?Psychiatric:     ?   Behavior: Behavior normal.  ? ? ? ?UC Treatments / Results  ?Labs ?(all labs ordered are listed, but only abnormal results are displayed) ?Labs Reviewed - No data to display ? ?EKG ? ? ?Radiology ?DG Hip Unilat With Pelvis 2-3 Views Right ? ?Result Date: 02/07/2022 ?CLINICAL DATA:  81 year old female with history of right-sided groin pain for the past 2 weeks. EXAM: DG HIP (WITH OR WITHOUT PELVIS) 2-3V RIGHT COMPARISON:  11/19/2013. FINDINGS: Three views of the bony pelvis in the right hip demonstrate no acute displaced fracture of the bony pelvic ring. Bilateral proximal femurs as visualized are intact. Right femoral head is located. There is joint space narrowing, subchondral sclerosis, subchondral cyst formation and osteophyte formation in the hip joints bilaterally (right greater than left), indicative of osteoarthritis. IMPRESSION: 1. No acute radiographic abnormality of the bony pelvis or the right hip. 2. 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 Electronically Signed   By: Vinnie Langton M.D.   On: 02/07/2022 11:56   ? ?Procedures ?Procedures (including critical care time) ? ?Medications Ordered in UC ?Medications - No data t

## 2022-02-24 ENCOUNTER — Ambulatory Visit (INDEPENDENT_AMBULATORY_CARE_PROVIDER_SITE_OTHER): Payer: Medicare HMO | Admitting: Medical

## 2022-02-24 ENCOUNTER — Encounter: Payer: Self-pay | Admitting: Medical

## 2022-02-24 VITALS — BP 120/70 | HR 70 | Wt 168.2 lb

## 2022-02-24 DIAGNOSIS — Z794 Long term (current) use of insulin: Secondary | ICD-10-CM | POA: Diagnosis not present

## 2022-02-24 DIAGNOSIS — R1031 Right lower quadrant pain: Secondary | ICD-10-CM | POA: Insufficient documentation

## 2022-02-24 DIAGNOSIS — E785 Hyperlipidemia, unspecified: Secondary | ICD-10-CM | POA: Diagnosis not present

## 2022-02-24 DIAGNOSIS — I739 Peripheral vascular disease, unspecified: Secondary | ICD-10-CM

## 2022-02-24 DIAGNOSIS — M25551 Pain in right hip: Secondary | ICD-10-CM

## 2022-02-24 DIAGNOSIS — Z87440 Personal history of urinary (tract) infections: Secondary | ICD-10-CM | POA: Diagnosis not present

## 2022-02-24 DIAGNOSIS — E1169 Type 2 diabetes mellitus with other specified complication: Secondary | ICD-10-CM | POA: Diagnosis not present

## 2022-02-24 MED ORDER — ROSUVASTATIN CALCIUM 20 MG PO TABS: 20.0000 mg | ORAL_TABLET | Freq: Every day | ORAL | 3 refills | Status: DC

## 2022-02-24 MED ORDER — METFORMIN HCL 500 MG PO TABS: 500.0000 mg | ORAL_TABLET | Freq: Two times a day (BID) | ORAL | 1 refills | Status: DC

## 2022-02-24 MED ORDER — ONETOUCH ULTRA VI STRP: ORAL_STRIP | 5 refills | Status: DC

## 2022-02-24 NOTE — Assessment & Plan Note (Signed)
Continue statin, need to be exercising more ?

## 2022-02-24 NOTE — Assessment & Plan Note (Signed)
Follow up with urology as planned.

## 2022-02-24 NOTE — Assessment & Plan Note (Signed)
Continue on metformin twice daily and Levemir daily.  She is doing Levemir through patient assistance ?

## 2022-02-24 NOTE — Assessment & Plan Note (Signed)
I reviewed her recent hip x-rays done last week showing some hip arthritis, worse on the right.  Advise she use the meloxicam short-term for flareup.  In general we discussed doing some stretching regularly, I demonstrated some strengthening and stretching exercises for her.  Can use Tylenol as needed. ?

## 2022-02-24 NOTE — Assessment & Plan Note (Signed)
Continue rosuvastatin 20 mg daily. 

## 2022-02-24 NOTE — Progress Notes (Signed)
Subjective: ? Marissa Oliver is a 81 y.o. female who presents for ?Chief Complaint  ?Patient presents with  ? UC follow-up  ?  Urgent care follow-up for groin pain  ?   ?Here for follow-up from urgent care.  She recently saw urgent care for groin and hip pain.  X-rays were done showing some arthritis worse in the right hip.  She was prescribed meloxicam which she is using with some improvement. ? ?She denies any specific pain with range of motion or certain activities.  No swelling.  No fever.  No falls. ? ?No current abdominal or back pain.  No numbness tingling or weakness in the legs. ? ?She has follow-up planned with urology for recurrent urinary tract infection.  She has no current urinary symptoms today. ? ?She needs a refill on Crestor and glucose test strips. ? ? ?No other aggravating or relieving factors.   ? ?No other c/o. ? ?The following portions of the patient's history were reviewed and updated as appropriate: allergies, current medications, past family history, past medical history, past social history, past surgical history and problem list. ? ?ROS ?Otherwise as in subjective above ? ?Objective: ?BP 120/70   Pulse 70   Wt 168 lb 3.2 oz (76.3 kg)   BMI 32.31 kg/m?  ? ?General appearance: alert, no distress, well developed, well nourished ?Abdomen: +bs, soft, non tender, non distended, no masses, no hepatomegaly, no splenomegaly ?Hips and legs nontender, no obvious swelling.  She has a little decreased range of motion internally of right hip, but otherwise bilateral hip range of motion relatively full, no significant pain with range of motion of hips.  No tenderness over the bursa.  Otherwise legs unremarkable ?Legs neurovascularly intact ?Pulses: 2+ radial pulses, 2+ pedal pulses, normal cap refill ?Ext: no edema ? ? ?Assessment: ?Encounter Diagnoses  ?Name Primary?  ? Groin pain, right Yes  ? Pain of right hip   ? Type 2 diabetes mellitus with other specified complication, with long-term current  use of insulin ([place])   ? Dyslipidemia associated with type 2 diabetes mellitus ([place])   ? History of recurrent UTIs   ? PAD (peripheral artery disease) ([place])   ? ? ? ?Plan: ?Problem List Items Addressed This Visit   ? ? Type 2 diabetes mellitus with other specified complication ([place])  ?  Continue on metformin twice daily and Levemir daily.  She is doing Levemir through patient assistance ? ?  ?  ? Relevant Medications  ? rosuvastatin (CRESTOR) 20 MG tablet  ? glucose blood (ONETOUCH ULTRA) test strip  ? Dyslipidemia associated with type 2 diabetes mellitus ([place])  ?  Continue rosuvastatin '20mg'$  daily ? ? ?  ?  ? Relevant Medications  ? rosuvastatin (CRESTOR) 20 MG tablet  ? History of recurrent UTIs  ?  Follow-up with urology as planned ? ?  ?  ? PAD (peripheral artery disease) ([place])  ?  Continue statin, need to be exercising more ? ?  ?  ? Relevant Medications  ? rosuvastatin (CRESTOR) 20 MG tablet  ? Groin pain, right - Primary  ? Pain of right hip  ?  I reviewed her recent hip x-rays done last week showing some hip arthritis, worse on the right.  Advise she use the meloxicam short-term for flareup.  In general we discussed doing some stretching regularly, I demonstrated some strengthening and stretching exercises for her.  Can use Tylenol as needed. ? ?  ?  ? ? ? ?Follow up:  4 months ? ?

## 2022-03-16 DIAGNOSIS — N3281 Overactive bladder: Secondary | ICD-10-CM | POA: Diagnosis not present

## 2022-03-16 DIAGNOSIS — N3000 Acute cystitis without hematuria: Secondary | ICD-10-CM | POA: Diagnosis not present

## 2022-04-15 ENCOUNTER — Other Ambulatory Visit: Payer: Self-pay

## 2022-04-15 ENCOUNTER — Encounter: Payer: Self-pay | Admitting: Nurse Practitioner

## 2022-04-15 ENCOUNTER — Ambulatory Visit: Payer: Medicare HMO | Admitting: Nurse Practitioner

## 2022-04-15 VITALS — BP 130/62 | HR 72 | Ht 60.5 in | Wt 167.4 lb

## 2022-04-15 DIAGNOSIS — K5904 Chronic idiopathic constipation: Secondary | ICD-10-CM

## 2022-04-15 NOTE — Progress Notes (Signed)
Assessment   Patient Profile:  Marissa Oliver is a 81 y.o. female known to Dr. Tarri Glenn with a past medical history of chronic constipation,  diabetes , anxiety , depression , GERD, colon polyps, diverticulosis.  Additional medical history as listed in [place] .  Chronic constipation with decreased frequency / urge. Managing well with Linzess 290 mcg as needed and fiber a few times a week  Family history of pancreatic cancer. Two cousins ( sisters), one in her 22's and the other in her 36's had pancreatic cancer. Patient inquiring about what signs / symptoms she should look for  Plan   Continue fiber and Linzess 290 mcg ( on empty stomach) as needed Discussed that we generally do not "screen" for pancreatic cancer though that could change in the future. Signs / symptoms are often late manifestations but can include but not be limited to unintentional weight loss, jaundice , abdominal pain, pale stool, dark urine.    HPI   Chief Complaint : needs linzess and also concerned because cousin has pancreatic cancer  Marissa Oliver wants to talk about what signs to look for with pancreatic cancer. Recently has had two cousins diagnosed with pancreatic cancer. No pancreatic cancer in a primary relative. Prostate cancer is only cancer in her immediate family.   She has a history of chronic constipation. Takes Linzess 290 mcg and fiber as needed . Has at least three BMs a week. No blood in stool. She drinks two bottles of water of water a day. Her weight is stable.    Imaging   Colonoscopy 03/24/1999 was normal except for diverticulosis and small hemorrhoids.  - Colonoscopy with Dr. Collene Mares 04/23/2005 showed small internal hemorrhoids, sigmoid diverticulosis, and no polyps. - Colonoscopy 08/31/2010 which showed scattered diverticulosis, hyperplastic polyps from the rectosigmoid colon, and a descending colon tubular adenoma  Labs:     Latest Ref Rng & Units 02/03/2022    8:30 AM 12/02/2020   11:38 AM  10/11/2019   10:40 AM  CBC  WBC 3.4 - 10.8 x10E3/uL 5.6  5.8  6.1   Hemoglobin 11.1 - 15.9 g/dL 12.5  14.4  14.6   Hematocrit 34.0 - 46.6 % 37.7  42.0  43.1   Platelets 150 - 450 x10E3/uL 181  154  183        Latest Ref Rng & Units 04/16/2021    9:33 AM 12/02/2020   11:38 AM 04/07/2020   10:58 AM  Hepatic Function  Total Protein 6.0 - 8.5 g/dL 6.8  6.5  6.8   Albumin 3.6 - 4.6 g/dL 4.6  4.6  4.7   AST 0 - 40 IU/L _0 ALT 0 - 32 IU/L _1 Alk Phosphatase 44 - 121 IU/L 95  98  105   Total Bilirubin 0.0 - 1.2 mg/dL 0.3  0.3  0.3      Past Medical History:  Diagnosis Date   Atrophic vaginitis    estrace cream rx'd by Dr. Karsten Ro   Colon polyp    tubular adenoma (colonoscopy q5 yrs)   Diabetes mellitus 2005   Diverticulosis    Dyslipidemia    GAD (generalized anxiety disorder)    Hemorrhoids    Hyperlipidemia    OAB (overactive bladder)    Osteoporosis    Psoriasis    mild, sees [facility]   Smoker     Past Surgical History:  Procedure Laterality Date   ABDOMINAL  HYSTERECTOMY  1972   partial   APPENDECTOMY     CARDIOVASCULAR STRESS TEST  12/25/2004   EF 65%. OVERALL NORMAL STRESS CARDIOLITE. NO EVIDENCE  OF ISCHEMIA   COLONOSCOPY  08/2010   cologuard 01/2016 normal   PUBOVAGINAL SLING  04/1998   TUBAL LIGATION      Current Medications, Allergies, Family History and Social History were reviewed in [employer] record.     Current Outpatient Medications  Medication Sig Dispense Refill   aspirin EC 81 MG tablet Take 1 tablet (81 mg total) by mouth every other day. Swallow whole. 90 tablet 3   Fiber POWD Take 1 Dose by mouth as needed.     glucose blood (ONETOUCH ULTRA) test strip USE AS DIRECTED TO TEST BLOOD SUGAR TWO TIMES A DAY 50 strip 5   insulin detemir (LEVEMIR FLEXTOUCH) 100 UNIT/ML FlexPen Inject 40 Units into the skin daily. 15 mL 2   Insulin Pen Needle (B-D UF III MINI PEN NEEDLES) 31G X 5 MM MISC USE ONCE  DAILY 100 each 1   LINZESS 290 MCG CAPS capsule Take 1 capsule (290 mcg total) by mouth daily. Samples of this drug were given to the patient, quantity 24, Lot Number [id number] exp 11/2021 24 capsule 0   lisinopril (ZESTRIL) 5 MG tablet TAKE ONE TABLET BY MOUTH DAILY 90 tablet 3   metFORMIN (GLUCOPHAGE) 500 MG tablet Take 1 tablet (500 mg total) by mouth 2 (two) times daily with a meal. 200 tablet 1   oxybutynin (DITROPAN-XL) 10 MG 24 hr tablet Take 10 mg by mouth daily.     pantoprazole (PROTONIX) 40 MG tablet Take 1 tablet (40 mg total) by mouth every morning. 90 tablet 3   polyethylene glycol powder (GLYCOLAX/MIRALAX) 17 GM/SCOOP powder Take 17 g by mouth daily.     rosuvastatin (CRESTOR) 20 MG tablet Take 1 tablet (20 mg total) by mouth daily. 90 tablet 3   trimethoprim (TRIMPEX) 100 MG tablet Take 100 mg by mouth daily.     vitamin B-6 (PYRIDOXINE) 25 MG tablet Take 1 tablet (25 mg total) by mouth daily. 90 tablet 0   No current facility-administered medications for this visit.    Review of Systems: No chest pain. No shortness of breath. No urinary complaints.    Physical Exam  Wt Readings from Last 3 Encounters:  04/15/22 167 lb 6 oz (75.9 kg)  02/24/22 168 lb 3.2 oz (76.3 kg)  02/02/22 165 lb (74.8 kg)    BP 130/62 (BP Location: Left Arm, Patient Position: Sitting, Cuff Size: Normal)   Pulse 72   Ht 5' 0.5" (1.537 m) Comment: height measured without shoes  Wt 167 lb 6 oz (75.9 kg)   BMI 32.15 kg/m  Constitutional:  Generally well appearing female in no acute distress. Psychiatric: Pleasant. Normal mood and affect. Behavior is normal. EENT: Pupils normal.  Conjunctivae are normal. No scleral icterus. Neck supple.  Cardiovascular: Normal rate, regular rhythm. No edema Pulmonary/chest: Effort normal and breath sounds normal. No wheezing, rales or rhonchi. Abdominal: Soft, nondistended, nontender. Bowel sounds active throughout. There are no masses palpable. No  hepatomegaly. Neurological: Alert and oriented to person place and time. Skin: Skin is warm and dry. No rashes noted.  Tye Savoy, NP  04/15/2022, 11:23 AM

## 2022-04-15 NOTE — Patient Instructions (Addendum)
Continue Linzess 290 mcg on empty stomach as needed for constipation Continue Fiber every other day for constipation Follow up with [place] as needed  If you are age 83 or older, your body mass index should be between 23-30. Your Body mass index is 32.15 kg/m. If this is out of the aforementioned range listed, please consider follow up with your Primary Care Provider.  If you are age 23 or younger, your body mass index should be between 19-25. Your Body mass index is 32.15 kg/m. If this is out of the aformentioned range listed, please consider follow up with your Primary Care Provider.   ________________________________________________________  The [facility] providers would like to encourage you to use [facility] to communicate with providers for non-urgent requests or questions.  Due to long hold times on the telephone, sending your provider a message by [facility] may be a faster and more efficient way to get a response.  Please allow 48 business hours for a response.  Please remember that this is for non-urgent requests.  _______________________________________________________   I appreciate the  opportunity to care for you  Thank You   [place]

## 2022-04-16 ENCOUNTER — Other Ambulatory Visit: Payer: Self-pay

## 2022-04-16 ENCOUNTER — Ambulatory Visit (INDEPENDENT_AMBULATORY_CARE_PROVIDER_SITE_OTHER): Payer: Medicare HMO

## 2022-04-16 ENCOUNTER — Telehealth: Payer: Self-pay

## 2022-04-16 VITALS — BP 112/58 | HR 66 | Temp 97.7°F | Ht 61.0 in | Wt 168.8 lb

## 2022-04-16 DIAGNOSIS — Z0000 Encounter for general adult medical examination without abnormal findings: Secondary | ICD-10-CM | POA: Diagnosis not present

## 2022-04-16 MED ORDER — OXYBUTYNIN CHLORIDE ER 5 MG PO TB24: 5.0000 mg | ORAL_TABLET | Freq: Two times a day (BID) | ORAL | 0 refills | Status: DC

## 2022-04-16 NOTE — Patient Instructions (Signed)
Marissa Oliver , Thank you for taking time to come for your Medicare Wellness Visit. I appreciate your ongoing commitment to your health goals. Please review the following plan we discussed and let me know if I can assist you in the future.   Screening recommendations/referrals: Colonoscopy: not required Mammogram: not required Bone Density: completed 12/09/2020 Recommended yearly ophthalmology/optometry visit for glaucoma screening and checkup Recommended yearly dental visit for hygiene and checkup  Vaccinations: Influenza vaccine: due 06/01/2022 Pneumococcal vaccine: completed 12/02/2020 Tdap vaccine: completed 05/26/2021, due 05/27/2031 Shingles vaccine: completed    Covid-19:01/02/2020, 12/08/2019  Advanced directives: copy in chart  Conditions/risks identified: none  Next appointment: Follow up in one year for your annual wellness visit    Preventive Care 83 Years and Older, Female Preventive care refers to lifestyle choices and visits with your health care provider that can promote health and wellness. What does preventive care include? A yearly physical exam. This is also called an annual well check. Dental exams once or twice a year. Routine eye exams. Ask your health care provider how often you should have your eyes checked. Personal lifestyle choices, including: Daily care of your teeth and gums. Regular physical activity. Eating a healthy diet. Avoiding tobacco and drug use. Limiting alcohol use. Practicing safe sex. Taking low-dose aspirin every day. Taking vitamin and mineral supplements as recommended by your health care provider. What happens during an annual well check? The services and screenings done by your health care provider during your annual well check will depend on your age, overall health, lifestyle risk factors, and family history of disease. Counseling  Your health care provider may ask you questions about your: Alcohol use. Tobacco use. Drug use. Emotional  well-being. Home and relationship well-being. Sexual activity. Eating habits. History of falls. Memory and ability to understand (cognition). Work and work Statistician. Reproductive health. Screening  You may have the following tests or measurements: Height, weight, and BMI. Blood pressure. Lipid and cholesterol levels. These may be checked every 5 years, or more frequently if you are over 74 years old. Skin check. Lung cancer screening. You may have this screening every year starting at age 62 if you have a 30-pack-year history of smoking and currently smoke or have quit within the past 15 years. Fecal occult blood test (FOBT) of the stool. You may have this test every year starting at age 55. Flexible sigmoidoscopy or colonoscopy. You may have a sigmoidoscopy every 5 years or a colonoscopy every 10 years starting at age 15. Hepatitis C blood test. Hepatitis B blood test. Sexually transmitted disease (STD) testing. Diabetes screening. This is done by checking your blood sugar (glucose) after you have not eaten for a while (fasting). You may have this done every 1-3 years. Bone density scan. This is done to screen for osteoporosis. You may have this done starting at age 53. Mammogram. This may be done every 1-2 years. Talk to your health care provider about how often you should have regular mammograms. Talk with your health care provider about your test results, treatment options, and if necessary, the need for more tests. Vaccines  Your health care provider may recommend certain vaccines, such as: Influenza vaccine. This is recommended every year. Tetanus, diphtheria, and acellular pertussis (Tdap, Td) vaccine. You may need a Td booster every 10 years. Zoster vaccine. You may need this after age 58. Pneumococcal 13-valent conjugate (PCV13) vaccine. One dose is recommended after age 60. Pneumococcal polysaccharide (PPSV23) vaccine. One dose is recommended after age 58.  Talk to your  health care provider about which screenings and vaccines you need and how often you need them. This information is not intended to replace advice given to you by your health care provider. Make sure you discuss any questions you have with your health care provider. Document Released: 11/14/2015 Document Revised: 07/07/2016 Document Reviewed: 08/19/2015 Elsevier Interactive Patient Education  2017 [employer] Prevention in the Home Falls can cause injuries. They can happen to people of all ages. There are many things you can do to make your home safe and to help prevent falls. What can I do on the outside of my home? Regularly fix the edges of walkways and driveways and fix any cracks. Remove anything that might make you trip as you walk through a door, such as a raised step or threshold. Trim any bushes or trees on the path to your home. Use bright outdoor lighting. Clear any walking paths of anything that might make someone trip, such as rocks or tools. Regularly check to see if handrails are loose or broken. Make sure that both sides of any steps have handrails. Any raised decks and porches should have guardrails on the edges. Have any leaves, snow, or ice cleared regularly. Use sand or salt on walking paths during winter. Clean up any spills in your garage right away. This includes oil or grease spills. What can I do in the bathroom? Use night lights. Install grab bars by the toilet and in the tub and shower. Do not use towel bars as grab bars. Use non-skid mats or decals in the tub or shower. If you need to sit down in the shower, use a plastic, non-slip stool. Keep the floor dry. Clean up any water that spills on the floor as soon as it happens. Remove soap buildup in the tub or shower regularly. Attach bath mats securely with double-sided non-slip rug tape. Do not have throw rugs and other things on the floor that can make you trip. What can I do in the bedroom? Use night  lights. Make sure that you have a light by your bed that is easy to reach. Do not use any sheets or blankets that are too big for your bed. They should not hang down onto the floor. Have a firm chair that has side arms. You can use this for support while you get dressed. Do not have throw rugs and other things on the floor that can make you trip. What can I do in the kitchen? Clean up any spills right away. Avoid walking on wet floors. Keep items that you use a lot in easy-to-reach places. If you need to reach something above you, use a strong step stool that has a grab bar. Keep electrical cords out of the way. Do not use floor polish or wax that makes floors slippery. If you must use wax, use non-skid floor wax. Do not have throw rugs and other things on the floor that can make you trip. What can I do with my stairs? Do not leave any items on the stairs. Make sure that there are handrails on both sides of the stairs and use them. Fix handrails that are broken or loose. Make sure that handrails are as long as the stairways. Check any carpeting to make sure that it is firmly attached to the stairs. Fix any carpet that is loose or worn. Avoid having throw rugs at the top or bottom of the stairs. If you do have throw  rugs, attach them to the floor with carpet tape. Make sure that you have a light switch at the top of the stairs and the bottom of the stairs. If you do not have them, ask someone to add them for you. What else can I do to help prevent falls? Wear shoes that: Do not have high heels. Have rubber bottoms. Are comfortable and fit you well. Are closed at the toe. Do not wear sandals. If you use a stepladder: Make sure that it is fully opened. Do not climb a closed stepladder. Make sure that both sides of the stepladder are locked into place. Ask someone to hold it for you, if possible. Clearly mark and make sure that you can see: Any grab bars or handrails. First and last  steps. Where the edge of each step is. Use tools that help you move around (mobility aids) if they are needed. These include: Canes. Walkers. Scooters. Crutches. Turn on the lights when you go into a dark area. Replace any light bulbs as soon as they burn out. Set up your furniture so you have a clear path. Avoid moving your furniture around. If any of your floors are uneven, fix them. If there are any pets around you, be aware of where they are. Review your medicines with your doctor. Some medicines can make you feel dizzy. This can increase your chance of falling. Ask your doctor what other things that you can do to help prevent falls. This information is not intended to replace advice given to you by your health care provider. Make sure you discuss any questions you have with your health care provider. Document Released: 08/14/2009 Document Revised: 03/25/2016 Document Reviewed: 11/22/2014 Elsevier Interactive Patient Education  2017 [employer].

## 2022-04-16 NOTE — Progress Notes (Signed)
Subjective:   Marissa Oliver is a 81 y.o. female who presents for Medicare Annual (Subsequent) preventive examination.  Review of Systems           Objective:    Today's Vitals   04/16/22 1051  BP: (!) 112/58  Pulse: 66  Temp: 97.7 F (36.5 C)  TempSrc: Oral  SpO2: 98%  Weight: 168 lb 12.8 oz (76.6 kg)  Height: '5\' 1"'$  (1.549 m)   Body mass index is 31.89 kg/m.     04/16/2022   11:00 AM 04/07/2020   10:02 AM  Advanced Directives  Does Patient Have a Medical Advance Directive? Yes Yes  Type of Paramedic of [place];Living will Living will;Healthcare Power of [place] in Chart? Yes - validated most recent copy scanned in chart (See row information) Yes - validated most recent copy scanned in chart (See row information)    Current Medications (verified) Outpatient Encounter Medications as of 04/16/2022  Medication Sig   aspirin EC 81 MG tablet Take 1 tablet (81 mg total) by mouth every other day. Swallow whole.   Fiber POWD Take 1 Dose by mouth as needed.   glucose blood (ONETOUCH ULTRA) test strip USE AS DIRECTED TO TEST BLOOD SUGAR TWO TIMES A DAY   insulin detemir (LEVEMIR FLEXTOUCH) 100 UNIT/ML FlexPen Inject 40 Units into the skin daily.   Insulin Pen Needle (B-D UF III MINI PEN NEEDLES) 31G X 5 MM MISC USE ONCE DAILY   LINZESS 290 MCG CAPS capsule Take 1 capsule (290 mcg total) by mouth daily. Samples of this drug were given to the patient, quantity 24, Lot Number [id number] 11/2021   lisinopril (ZESTRIL) 5 MG tablet TAKE ONE TABLET BY MOUTH DAILY   metFORMIN (GLUCOPHAGE) 500 MG tablet Take 1 tablet (500 mg total) by mouth 2 (two) times daily with a meal.   oxybutynin (DITROPAN-XL) 5 MG 24 hr tablet Take 1 tablet (5 mg total) by mouth 2 (two) times daily. Take 5 mg by mouth twice a day   pantoprazole (PROTONIX) 40 MG tablet Take 1 tablet (40 mg total) by mouth every morning.   polyethylene glycol powder  (GLYCOLAX/MIRALAX) 17 GM/SCOOP powder Take 17 g by mouth daily.   rosuvastatin (CRESTOR) 20 MG tablet Take 1 tablet (20 mg total) by mouth daily.   trimethoprim (TRIMPEX) 100 MG tablet Take 100 mg by mouth daily.   vitamin B-6 (PYRIDOXINE) 25 MG tablet Take 1 tablet (25 mg total) by mouth daily.   [DISCONTINUED] oxybutynin (DITROPAN-XL) 10 MG 24 hr tablet Take 5 mg by mouth daily.   No facility-administered encounter medications on file as of 04/16/2022.    Allergies (verified) Farxiga [dapagliflozin]   History: Past Medical History:  Diagnosis Date   Atrophic vaginitis    estrace cream rx'd by Dr. Karsten Ro   Colon polyp    tubular adenoma (colonoscopy q5 yrs)   Diabetes mellitus 2005   Diverticulosis    Dyslipidemia    GAD (generalized anxiety disorder)    Hemorrhoids    Hyperlipidemia    OAB (overactive bladder)    Osteoporosis    Psoriasis    mild, sees [facility]   Smoker    Past Surgical History:  Procedure Laterality Date   ABDOMINAL HYSTERECTOMY  1972   partial   APPENDECTOMY     CARDIOVASCULAR STRESS TEST  12/25/2004   EF 65%. OVERALL NORMAL STRESS CARDIOLITE. NO EVIDENCE  OF ISCHEMIA  COLONOSCOPY  08/2010   cologuard 01/2016 normal   PUBOVAGINAL SLING  04/1998   TUBAL LIGATION     Family History  Problem Relation Age of Onset   Arthritis Mother    Heart failure Mother    Mental illness Father    Kidney disease Father    Stroke Father    Heart attack Father    Macular degeneration Father    Heart disease Brother    Prostate cancer Brother    Stroke Paternal Grandmother    Stroke Maternal Aunt    Cancer Maternal Uncle    Social History   Socioeconomic History   Marital status: Divorced    Spouse name: Not on file   Number of children: Not on file   Years of education: Not on file   Highest education level: Not on file  Occupational History   Not on file  Tobacco Use   Smoking status: Former    Packs/day: 0.50    Types: Cigarettes     Quit date: 05/22/2012    Years since quitting: 9.9   Smokeless tobacco: Never  Vaping Use   Vaping Use: Never used  Substance and Sexual Activity   Alcohol use: No    Alcohol/week: 0.0 standard drinks of alcohol   Drug use: No   Sexual activity: Not Currently  Other Topics Concern   Not on file  Social History Narrative   Divorced, 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  Has 3 children.  Exercises some with walking.  04/2021   Social Determinants of Health   Financial Resource Strain: Low Risk  (04/16/2022)   Overall Financial Resource Strain (CARDIA)    Difficulty of Paying Living Expenses: Not hard at all  Food Insecurity: No Food Insecurity (04/16/2022)   Hunger Vital Sign    Worried About Running Out of Food in the Last Year: Never true    Ran Out of Food in the Last Year: Never true  Transportation Needs: No Transportation Needs (04/16/2022)   PRAPARE - Hydrologist (Medical): No    Lack of Transportation (Non-Medical): No  Physical Activity: Inactive (04/16/2022)   Exercise Vital Sign    Days of Exercise per Week: 0 days    Minutes of Exercise per Session: 0 min  Stress: No Stress Concern Present (04/16/2022)   [place]    Feeling of Stress : Not at all  Social Connections: Not on file    Tobacco Counseling Counseling given: Not Answered   Clinical Intake:  Pre-visit preparation completed: Yes  Pain : No/denies pain     Nutritional Status: BMI > 30  Obese Nutritional Risks: None Diabetes: Yes  How often do you need to have someone help you when you read instructions, pamphlets, or other written materials from your doctor or pharmacy?: 1 - Never  Diabetic? Yes Nutrition Risk Assessment:  Has the patient had any N/V/D within the last 2 months?  No  Does the patient have any non-healing wounds?  No  Has the patient had any unintentional weight loss or weight  gain?  No   Diabetes:  Is the patient diabetic?  Yes  If diabetic, was a CBG obtained today?  No  Did the patient bring in their glucometer from home?  No  How often do you monitor your CBG's? daily.   Financial Strains and Diabetes Management:  Are you having any financial strains with the device,  your supplies or your medication? No .  Does the patient want to be seen by Chronic Care Management for management of their diabetes?  No  Would the patient like to be referred to a Nutritionist or for Diabetic Management?  No   Diabetic Exams:  Diabetic Eye Exam: Completed 04/16/2021 Diabetic Foot Exam: Completed 07/20/2021   Interpreter Needed?: No  Information entered by :: NAllen LPN   Activities of Daily Living     No data to display          Patient Care Team: Tysinger, Camelia Eng, PA-C as PCP - General (Family Medicine) Freada Bergeron, MD as PCP - Cardiology (Cardiology) Bjorn Loser, MD as Consulting Physician (Urology)  Indicate any recent Medical Services you may have received from other than [facility] providers in the past year (date may be approximate).     Assessment:   This is a routine wellness examination for [place].  Hearing/Vision screen Vision Screening - Comments:: Regular eye exams, Dr. Ellie Lunch  Dietary issues and exercise activities discussed:     Goals Addressed             This Visit's Progress    Patient Stated       04/16/2022, no goals       Depression Screen    04/16/2022   11:01 AM 02/02/2022   10:04 AM 07/20/2021   11:11 AM 04/15/2021    8:54 AM 04/07/2020   10:03 AM 03/03/2018   12:07 PM 02/16/2017   11:10 AM  PHQ 2/9 Scores  PHQ - 2 Score 2 0 0 1 0 2 2  PHQ- 9 Score '3   1  3     '$ Fall Risk    04/16/2022   11:00 AM 02/02/2022   10:04 AM 07/20/2021   11:11 AM 04/15/2021    8:53 AM 04/07/2020   10:03 AM  Fall Risk   Falls in the past year? 0 0 1 0 0  Number falls in past yr: 0 0 0 0   Injury with Fall? 0 0 1 0   Risk for  fall due to : Medication side effect No Fall Risks Impaired balance/gait No Fall Risks   Follow up Falls evaluation completed;Education provided;Falls prevention discussed Falls evaluation completed Falls evaluation completed Falls evaluation completed     FALL RISK PREVENTION PERTAINING TO THE HOME:  Any stairs in or around the home? No  If so, are there any without handrails?  N/a Home free of loose throw rugs in walkways, pet beds, electrical cords, etc? Yes  Adequate lighting in your home to reduce risk of falls? Yes   ASSISTIVE DEVICES UTILIZED TO PREVENT FALLS:  Life alert? No  Use of a cane, walker or w/c? No  Grab bars in the bathroom? Yes  Shower chair or bench in shower? No  Elevated toilet seat or a handicapped toilet? Yes   TIMED UP AND GO:  Was the test performed? No .    Gait steady and fast without use of assistive device  Cognitive Function:        Immunizations Immunization History  Administered Date(s) Administered   Fluad Quad(high Dose 65+) 07/09/2020, 07/20/2021   Influenza Split 08/12/2011, 08/15/2012, 07/15/2014   Influenza Whole 07/29/2008, 08/14/2009, 12/23/2010   Influenza, High Dose Seasonal PF 08/28/2013, 08/04/2015, 08/22/2018   Influenza,inj,Quad PF,6+ Mos 10/13/2016   Influenza-Unspecified 06/03/2019   PFIZER(Purple Top)SARS-COV-2 Vaccination 12/08/2019, 01/02/2020   Pneumococcal Conjugate-13 01/30/2014   Pneumococcal Polysaccharide-23 12/23/2010, 12/02/2020  Pneumococcal-Unspecified 12/23/2010   Td 05/26/2021   Tdap 03/01/2012   Zoster Recombinat (Shingrix) 04/18/2021    TDAP status: Up to date  Flu Vaccine status: Up to date  Pneumococcal vaccine status: Up to date  Covid-19 vaccine status: Completed vaccines  Qualifies for Shingles Vaccine? Yes   Zostavax completed Yes   Shingrix Completed?: Yes  Screening Tests Health Maintenance  Topic Date Due   COVID-19 Vaccine (3 - Pfizer series) 02/27/2020   OPHTHALMOLOGY EXAM   04/16/2022   INFLUENZA VACCINE  06/01/2022   FOOT EXAM  07/20/2022   HEMOGLOBIN A1C  08/05/2022   TETANUS/TDAP  05/27/2031   Pneumonia Vaccine 57+ Years old  Completed   DEXA SCAN  Completed   Zoster Vaccines- Shingrix  Completed   HPV VACCINES  Aged Out    Health Maintenance  Health Maintenance Due  Topic Date Due   COVID-19 Vaccine (3 - Pfizer series) 02/27/2020   OPHTHALMOLOGY EXAM  04/16/2022    Colorectal cancer screening: No longer required.   Mammogram status: No longer required due to age.  Bone Density status: Completed 12/09/2020.   Lung Cancer Screening: (Low Dose CT Chest recommended if Age 61-80 years, 30 pack-year currently smoking OR have quit w/in 15years.) does not qualify.   Lung Cancer Screening Referral: no  Additional Screening:  Hepatitis C Screening: does not qualify;   Vision Screening: Recommended annual ophthalmology exams for early detection of glaucoma and other disorders of the eye. Is the patient up to date with their annual eye exam?  No  Who is the provider or what is the name of the office in which the patient attends annual eye exams? Dr. Ellie Lunch If pt is not established with a provider, would they like to be referred to a provider to establish care? No .   Dental Screening: Recommended annual dental exams for proper oral hygiene  Community Resource Referral / Chronic Care Management: CRR required this visit?  No   CCM required this visit?  No      Plan:     I have personally reviewed and noted the following in the patient's chart:   Medical and social history Use of alcohol, tobacco or illicit drugs  Current medications and supplements including opioid prescriptions.  Functional ability and status Nutritional status Physical activity Advanced directives List of other physicians Hospitalizations, surgeries, and ER visits in previous 12 months Vitals Screenings to include cognitive, depression, and falls Referrals and  appointments  In addition, I have reviewed and discussed with patient certain preventive protocols, quality metrics, and best practice recommendations. A written personalized care plan for preventive services as well as general preventive health recommendations were provided to patient.     Kellie Simmering, LPN   1/61/0960   Nurse Notes: 6 CIT not administered. No cognitive impairments noted.

## 2022-04-16 NOTE — Telephone Encounter (Signed)
This nurse attempted to call patient in regards to missing scheduled AWV. No answer and was unable to leave a message.

## 2022-04-20 ENCOUNTER — Other Ambulatory Visit: Payer: Self-pay | Admitting: *Deleted

## 2022-04-20 MED ORDER — ONETOUCH DELICA LANCETS 33G MISC: 1.0000 | Freq: Two times a day (BID) | 2 refills | Status: DC

## 2022-04-28 DIAGNOSIS — H52203 Unspecified astigmatism, bilateral: Secondary | ICD-10-CM | POA: Diagnosis not present

## 2022-04-28 DIAGNOSIS — H40013 Open angle with borderline findings, low risk, bilateral: Secondary | ICD-10-CM | POA: Diagnosis not present

## 2022-04-28 DIAGNOSIS — E113292 Type 2 diabetes mellitus with mild nonproliferative diabetic retinopathy without macular edema, left eye: Secondary | ICD-10-CM | POA: Diagnosis not present

## 2022-04-28 DIAGNOSIS — Z961 Presence of intraocular lens: Secondary | ICD-10-CM | POA: Diagnosis not present

## 2022-04-28 LAB — HM DIABETES EYE EXAM

## 2022-04-29 ENCOUNTER — Encounter: Payer: Self-pay | Admitting: Internal Medicine

## 2022-05-13 ENCOUNTER — Other Ambulatory Visit: Payer: Self-pay | Admitting: Nurse Practitioner

## 2022-05-31 ENCOUNTER — Other Ambulatory Visit: Payer: Self-pay | Admitting: Medical

## 2022-05-31 MED ORDER — MELOXICAM 7.5 MG PO TABS: 7.5000 mg | ORAL_TABLET | Freq: Every day | ORAL | 0 refills | Status: DC

## 2022-06-01 ENCOUNTER — Other Ambulatory Visit: Payer: Self-pay | Admitting: Medical

## 2022-06-01 DIAGNOSIS — M1610 Unilateral primary osteoarthritis, unspecified hip: Secondary | ICD-10-CM

## 2022-06-15 DIAGNOSIS — L400 Psoriasis vulgaris: Secondary | ICD-10-CM | POA: Diagnosis not present

## 2022-06-15 DIAGNOSIS — Z85828 Personal history of other malignant neoplasm of skin: Secondary | ICD-10-CM | POA: Diagnosis not present

## 2022-06-15 DIAGNOSIS — L570 Actinic keratosis: Secondary | ICD-10-CM | POA: Diagnosis not present

## 2022-06-15 DIAGNOSIS — Z08 Encounter for follow-up examination after completed treatment for malignant neoplasm: Secondary | ICD-10-CM | POA: Diagnosis not present

## 2022-06-17 ENCOUNTER — Ambulatory Visit: Payer: Medicare HMO | Admitting: Medical

## 2022-06-28 ENCOUNTER — Other Ambulatory Visit: Payer: Self-pay

## 2022-06-28 MED ORDER — METFORMIN HCL 500 MG PO TABS: 500.0000 mg | ORAL_TABLET | Freq: Two times a day (BID) | ORAL | 1 refills | Status: DC

## 2022-06-30 ENCOUNTER — Other Ambulatory Visit: Payer: Self-pay

## 2022-06-30 ENCOUNTER — Encounter: Payer: Self-pay | Admitting: Medical

## 2022-06-30 ENCOUNTER — Telehealth (INDEPENDENT_AMBULATORY_CARE_PROVIDER_SITE_OTHER): Payer: Medicare HMO | Admitting: Medical

## 2022-06-30 VITALS — BP 120/68 | HR 70 | Temp 97.7°F | Ht 60.0 in | Wt 164.7 lb

## 2022-06-30 DIAGNOSIS — Z794 Long term (current) use of insulin: Secondary | ICD-10-CM | POA: Diagnosis not present

## 2022-06-30 DIAGNOSIS — R051 Acute cough: Secondary | ICD-10-CM

## 2022-06-30 DIAGNOSIS — E1169 Type 2 diabetes mellitus with other specified complication: Secondary | ICD-10-CM | POA: Diagnosis not present

## 2022-06-30 DIAGNOSIS — R062 Wheezing: Secondary | ICD-10-CM | POA: Diagnosis not present

## 2022-06-30 MED ORDER — ALBUTEROL SULFATE HFA 108 (90 BASE) MCG/ACT IN AERS: 2.0000 | INHALATION_SPRAY | Freq: Four times a day (QID) | RESPIRATORY_TRACT | 0 refills | Status: DC | PRN

## 2022-06-30 NOTE — Progress Notes (Signed)
Subjective:     Patient ID: Marissa Oliver, female   DOB: 20-Nov-1940, 81 y.o.   MRN: [id number]  This visit type was conducted due to national recommendations for restrictions regarding the COVID-19 Pandemic (e.g. social distancing) in an effort to limit this patient's exposure and mitigate transmission in our community.  Due to their co-morbid illnesses, this patient is at least at moderate risk for complications without adequate follow up.  This format is felt to be most appropriate for this patient at this time.    Documentation for virtual audio and video telecommunications through [place] encounter:  The patient was located at home. The provider was located in the office. The patient did consent to this visit and is aware of possible charges through their insurance for this visit.  The other persons participating in this telemedicine service were none. Time spent on call was 20 minutes and in review of previous records 20 minutes total.  This virtual service is not related to other E/M service within previous 7 days.   HPI Chief Complaint  Patient presents with   other    No fever/ cough, wheezing, feel terrible, no covid test, had ST yesterday not today though, started feeling bad Monday night.    Virtual for illness.  She notes runny nose, some wheezing, not feeling well, some sore throat. Has some phlegm.  Coughing more in morning than other parts of the day.  No fever. No ear pain, no NVD.  No headache.  Using alka seltzer plus.   Using noting else for symptoms.  No sick contacts.  No recent covid tests.  No other aggravating or relieving factors. No other complaint.  Past Medical History:  Diagnosis Date   Atrophic vaginitis    estrace cream rx'd by Dr. Karsten Ro   Colon polyp    tubular adenoma (colonoscopy q5 yrs)   Diabetes mellitus 2005   Diverticulosis    Dyslipidemia    GAD (generalized anxiety disorder)    Hemorrhoids    Hyperlipidemia    OAB (overactive  bladder)    Osteoporosis    Psoriasis    mild, sees [facility]   Current Outpatient Medications on File Prior to Visit  Medication Sig Dispense Refill   aspirin EC 81 MG tablet Take 1 tablet (81 mg total) by mouth every other day. Swallow whole. 90 tablet 3   Fiber POWD Take 1 Dose by mouth as needed.     glucose blood (ONETOUCH ULTRA) test strip USE AS DIRECTED TO TEST BLOOD SUGAR TWO TIMES A DAY 50 strip 5   insulin detemir (LEVEMIR FLEXTOUCH) 100 UNIT/ML FlexPen Inject 40 Units into the skin daily. 15 mL 2   Insulin Pen Needle (B-D UF III MINI PEN NEEDLES) 31G X 5 MM MISC USE ONCE DAILY 100 each 1   LINZESS 290 MCG CAPS capsule Take 1 capsule (290 mcg total) by mouth daily. Samples of this drug were given to the patient, quantity 24, Lot Number [id number] exp 11/2021 24 capsule 0   lisinopril (ZESTRIL) 5 MG tablet TAKE ONE TABLET BY MOUTH DAILY 90 tablet 3   metFORMIN (GLUCOPHAGE) 500 MG tablet Take 1 tablet (500 mg total) by mouth 2 (two) times daily with a meal. 200 tablet 1   OneTouch Delica Lancets 29N MISC 1 each by Does not apply route in the morning and at bedtime. Dx:E11.9 100 each 2   oxybutynin (DITROPAN-XL) 5 MG 24 hr tablet Take 1 tablet (5 mg total) by mouth 2 (  two) times daily. Take 5 mg by mouth twice a day 60 tablet 0   pantoprazole (PROTONIX) 40 MG tablet Take 1 tablet (40 mg total) by mouth every morning. 90 tablet 3   polyethylene glycol powder (GLYCOLAX/MIRALAX) 17 GM/SCOOP powder Take 17 g by mouth daily.     rosuvastatin (CRESTOR) 20 MG tablet Take 1 tablet (20 mg total) by mouth daily. 90 tablet 3   trimethoprim (TRIMPEX) 100 MG tablet Take 100 mg by mouth daily.     vitamin B-6 (PYRIDOXINE) 25 MG tablet Take 1 tablet (25 mg total) by mouth daily. 90 tablet 0   meloxicam (MOBIC) 7.5 MG tablet Take 1 tablet (7.5 mg total) by mouth daily. (Patient not taking: Reported on 06/30/2022) 30 tablet 0   No current facility-administered medications on file prior to visit.    Review of Systems As in subjective     Objective:   Physical Exam Due to coronavirus pandemic stay at home measures, patient visit was virtual and they were not examined in person.   BP 120/68   Pulse 70   Temp 97.7 F (36.5 C)   Ht 5' (1.524 m)   Wt 164 lb 11.2 oz (74.7 kg)   BMI 32.17 kg/m   General: Well-developed well-nourished no acute distress No witnessed wheezing but there is some wet rattly cough      Assessment:     Encounter Diagnoses  Name Primary?   Acute cough Yes   Wheezing    Type 2 diabetes mellitus with other specified complication, with long-term current use of insulin (HCC)        Plan:     We discussed symptoms and concerns and possible differential.  Because we are seeing an uptick in [place] cases in the community I will have her come in tomorrow morning for COVID screening in the back parking lot.  She could not come this afternoon.  Advised rest, hydration, continue the over-the-counter supportive measures she is doing.  Begin albuterol inhaler that was sent out today.  If much worse get reevaluated sooner.  Marissa Oliver was seen today for other.  Diagnoses and all orders for this visit:  Acute cough  Wheezing  Type 2 diabetes mellitus with other specified complication, with long-term current use of insulin ([place])  Other orders -     albuterol (VENTOLIN HFA) 108 (90 Base) MCG/ACT inhaler; Inhale 2 puffs into the lungs every 6 (six) hours as needed for wheezing or shortness of breath.  Return tomorrow morning for COVID screening in the back parking lot

## 2022-07-01 ENCOUNTER — Other Ambulatory Visit (INDEPENDENT_AMBULATORY_CARE_PROVIDER_SITE_OTHER): Payer: Medicare HMO

## 2022-07-01 DIAGNOSIS — R051 Acute cough: Secondary | ICD-10-CM

## 2022-07-01 LAB — POC COVID19 BINAXNOW: SARS Coronavirus 2 Ag: NEGATIVE

## 2022-07-02 ENCOUNTER — Other Ambulatory Visit: Payer: Self-pay | Admitting: Medical

## 2022-07-02 MED ORDER — GUAIFENESIN-CODEINE 100-10 MG/5ML PO SOLN: 5.0000 mL | Freq: Three times a day (TID) | ORAL | 0 refills | Status: DC | PRN

## 2022-07-02 MED ORDER — AZITHROMYCIN 250 MG PO TABS: ORAL_TABLET | ORAL | 0 refills | Status: DC

## 2022-07-06 ENCOUNTER — Telehealth: Payer: Self-pay | Admitting: Medical

## 2022-07-06 NOTE — Telephone Encounter (Signed)
PT ASSISTANCE LEVEMIR  Received, sent mychart message

## 2022-07-07 ENCOUNTER — Encounter: Payer: Self-pay | Admitting: Internal Medicine

## 2022-07-07 ENCOUNTER — Ambulatory Visit: Payer: Medicare HMO | Admitting: Orthopaedic Surgery

## 2022-07-08 ENCOUNTER — Ambulatory Visit: Payer: Medicare HMO | Admitting: Medical

## 2022-07-12 ENCOUNTER — Ambulatory Visit (INDEPENDENT_AMBULATORY_CARE_PROVIDER_SITE_OTHER): Payer: Medicare HMO | Admitting: Medical

## 2022-07-12 ENCOUNTER — Encounter: Payer: Self-pay | Admitting: Orthopaedic Surgery

## 2022-07-12 VITALS — BP 110/70 | HR 70 | Wt 164.6 lb

## 2022-07-12 DIAGNOSIS — Z794 Long term (current) use of insulin: Secondary | ICD-10-CM

## 2022-07-12 DIAGNOSIS — Z23 Encounter for immunization: Secondary | ICD-10-CM

## 2022-07-12 DIAGNOSIS — E1169 Type 2 diabetes mellitus with other specified complication: Secondary | ICD-10-CM

## 2022-07-12 DIAGNOSIS — F4321 Adjustment disorder with depressed mood: Secondary | ICD-10-CM

## 2022-07-12 DIAGNOSIS — E785 Hyperlipidemia, unspecified: Secondary | ICD-10-CM

## 2022-07-12 DIAGNOSIS — E559 Vitamin D deficiency, unspecified: Secondary | ICD-10-CM

## 2022-07-12 DIAGNOSIS — R69 Illness, unspecified: Secondary | ICD-10-CM | POA: Diagnosis not present

## 2022-07-12 LAB — POCT GLYCOSYLATED HEMOGLOBIN (HGB A1C): Hemoglobin A1C: 7.7 % — AB (ref 4.0–5.6)

## 2022-07-12 NOTE — Progress Notes (Signed)
Subjective:  Marissa Oliver is a 81 y.o. female who presents for Chief Complaint  Patient presents with   check A1c    Check A1c, flu shot given today     Here for med check.  Unfortunately her brother Marissa Oliver who lives with her passed away last week.  She is grieving his loss.  She did say he had a very nice funeral service.  Her other brother the only one remaining is in town visiting her and he has helped out this past week throughout the planning and service.  She is here for diabetes and med check follow-up.  Diabetes-she is taking 40 units of Levemir daily, metformin 500 mg twice daily.  No recent high or low numbers, staying relatively controlled.  No foot lesions of concern  Hyperlipidemia-compliant with Crestor without complaint  Compliant with lisinopril 5 mg daily for kidney protection  No other new complaint today.  Past Medical History:  Diagnosis Date   Atrophic vaginitis    estrace cream rx'd by Dr. Karsten Ro   Colon polyp    tubular adenoma (colonoscopy q5 yrs)   Diabetes mellitus 2005   Diverticulosis    Dyslipidemia    GAD (generalized anxiety disorder)    Hemorrhoids    Hyperlipidemia    OAB (overactive bladder)    Osteoporosis    Psoriasis    mild, sees [facility]   Current Outpatient Medications on File Prior to Visit  Medication Sig Dispense Refill   albuterol (VENTOLIN HFA) 108 (90 Base) MCG/ACT inhaler Inhale 2 puffs into the lungs every 6 (six) hours as needed for wheezing or shortness of breath. 8 g 0   aspirin EC 81 MG tablet Take 1 tablet (81 mg total) by mouth every other day. Swallow whole. 90 tablet 3   Fiber POWD Take 1 Dose by mouth as needed.     insulin detemir (LEVEMIR FLEXTOUCH) 100 UNIT/ML FlexPen Inject 40 Units into the skin daily. 15 mL 2   Insulin Pen Needle (B-D UF III MINI PEN NEEDLES) 31G X 5 MM MISC USE ONCE DAILY 100 each 1   LINZESS 290 MCG CAPS capsule Take 1 capsule (290 mcg total) by mouth daily. Samples of this drug  were given to the patient, quantity 24, Lot Number [id number] exp 11/2021 24 capsule 0   lisinopril (ZESTRIL) 5 MG tablet TAKE ONE TABLET BY MOUTH DAILY 90 tablet 3   metFORMIN (GLUCOPHAGE) 500 MG tablet Take 1 tablet (500 mg total) by mouth 2 (two) times daily with a meal. 200 tablet 1   oxybutynin (DITROPAN-XL) 5 MG 24 hr tablet Take 1 tablet (5 mg total) by mouth 2 (two) times daily. Take 5 mg by mouth twice a day 60 tablet 0   pantoprazole (PROTONIX) 40 MG tablet Take 1 tablet (40 mg total) by mouth every morning. 90 tablet 3   polyethylene glycol powder (GLYCOLAX/MIRALAX) 17 GM/SCOOP powder Take 17 g by mouth daily.     rosuvastatin (CRESTOR) 20 MG tablet Take 1 tablet (20 mg total) by mouth daily. 90 tablet 3   trimethoprim (TRIMPEX) 100 MG tablet Take 100 mg by mouth daily.     vitamin B-6 (PYRIDOXINE) 25 MG tablet Take 1 tablet (25 mg total) by mouth daily. 90 tablet 0   glucose blood (ONETOUCH ULTRA) test strip USE AS DIRECTED TO TEST BLOOD SUGAR TWO TIMES A DAY 50 strip 5   OneTouch Delica Lancets 45Y MISC 1 each by Does not apply route in the morning  and at bedtime. Dx:E11.9 100 each 2   No current facility-administered medications on file prior to visit.     The following portions of the patient's history were reviewed and updated as appropriate: allergies, current medications, past family history, past medical history, past social history, past surgical history and problem list.  ROS Otherwise as in subjective above    Objective: BP 110/70   Pulse 70   Wt 164 lb 9.6 oz (74.7 kg)   BMI 32.15 kg/m   General appearance: alert, no distress, well developed, well nourished Neck: supple, no lymphadenopathy, no thyromegaly, no masses Heart: RRR, normal S1, S2, no murmurs Lungs: CTA bilaterally, no wheezes, rhonchi, or rales Pulses: 2+ radial pulses, 2+ pedal pulses, normal cap refill Ext: no edema  Diabetic Foot Exam - Simple   Simple Foot Form Diabetic Foot exam was performed  with the following findings: Yes 07/12/2022 12:46 PM  Visual Inspection See comments: Yes Sensation Testing Intact to touch and monofilament testing bilaterally: Yes Pulse Check Posterior Tibialis and Dorsalis pulse intact bilaterally: Yes Comments Mildly thickened toenails bilaterally but no specific concern       Assessment: Encounter Diagnoses  Name Primary?   Type 2 diabetes mellitus with other specified complication, with long-term current use of insulin (HCC) Yes   Needs flu shot    Vitamin D deficiency    Dyslipidemia associated with type 2 diabetes mellitus ([place])    Grief      Plan: I expressed my sympathy for the loss of her brother last week who is also my patient.    Diabetes-hemoglobin A1c 7.7% today.  Continue metformin '500mg'$  twice daily, increase to Levemir 42 units daily.  Continue glucose testing, daily foot checks, yearly eye doctor visit  Hyperlipidemia-continue statin, labs today as below  Continue lisinopril for renal protection  She takes Protonix for GERD, discussed risk and benefits of medication   Counseled on the influenza virus vaccine.  Vaccine information sheet given.   High dose Influenza vaccine given after consent obtained.    Marissa Oliver was seen today for check a1c.  Diagnoses and all orders for this visit:  Type 2 diabetes mellitus with other specified complication, with long-term current use of insulin (HCC) -     HgB A1c -     Comprehensive metabolic panel -     Microalbumin/Creatinine Ratio, Urine  Needs flu shot -     Flu Vaccine QUAD High Dose(Fluad)  Vitamin D deficiency  Dyslipidemia associated with type 2 diabetes mellitus ([place]) -     Lipid panel  Grief   Follow up: pending labs

## 2022-07-12 NOTE — Addendum Note (Signed)
Addended by: Minette Headland A on: 07/12/2022 03:03 PM   Modules accepted: Orders

## 2022-07-13 LAB — COMPREHENSIVE METABOLIC PANEL
ALT: 16 IU/L (ref 0–32)
AST: 18 IU/L (ref 0–40)
Albumin/Globulin Ratio: 2.2 (ref 1.2–2.2)
Albumin: 4.8 g/dL — ABNORMAL HIGH (ref 3.7–4.7)
Alkaline Phosphatase: 97 IU/L (ref 44–121)
BUN/Creatinine Ratio: 18 (ref 12–28)
BUN: 17 mg/dL (ref 8–27)
Bilirubin Total: 0.2 mg/dL (ref 0.0–1.2)
CO2: 23 mmol/L (ref 20–29)
Calcium: 10 mg/dL (ref 8.7–10.3)
Chloride: 103 mmol/L (ref 96–106)
Creatinine, Ser: 0.96 mg/dL (ref 0.57–1.00)
Globulin, Total: 2.2 g/dL (ref 1.5–4.5)
Glucose: 119 mg/dL — ABNORMAL HIGH (ref 70–99)
Potassium: 4.4 mmol/L (ref 3.5–5.2)
Sodium: 140 mmol/L (ref 134–144)
Total Protein: 7 g/dL (ref 6.0–8.5)
eGFR: 59 mL/min/{1.73_m2} — ABNORMAL LOW (ref >59)

## 2022-07-13 LAB — MICROALBUMIN / CREATININE URINE RATIO
Creatinine, Urine: 123.3 mg/dL
Microalb/Creat Ratio: 20 mg/g creat (ref 0–29)
Microalbumin, Urine: 25.1 ug/mL

## 2022-07-13 LAB — LIPID PANEL
Chol/HDL Ratio: 2.8 ratio (ref 0.0–4.4)
Cholesterol, Total: 134 mg/dL (ref 100–199)
HDL: 48 mg/dL (ref >39)
LDL Chol Calc (NIH): 58 mg/dL (ref 0–99)
Triglycerides: 166 mg/dL — ABNORMAL HIGH (ref 0–149)
VLDL Cholesterol Cal: 28 mg/dL (ref 5–40)

## 2022-07-16 ENCOUNTER — Telehealth: Payer: Self-pay

## 2022-07-16 NOTE — Telephone Encounter (Signed)
I spoke with Marissa Oliver she said she  checks her BS every morning as soon as she gets up and has not eaten any breakfast yet. She said yesterday it was 115 but this morning it was 70. She wondered if she should take less Metformin or less insulin.

## 2022-07-18 NOTE — Progress Notes (Signed)
Reviewed and agree with management plans. ? ?Burwell Bethel L. Sharnetta Gielow, MD, MPH  ?

## 2022-07-21 ENCOUNTER — Ambulatory Visit: Payer: Medicare HMO | Admitting: Orthopaedic Surgery

## 2022-08-09 ENCOUNTER — Other Ambulatory Visit: Payer: Self-pay

## 2022-08-09 DIAGNOSIS — K219 Gastro-esophageal reflux disease without esophagitis: Secondary | ICD-10-CM

## 2022-08-09 DIAGNOSIS — K5900 Constipation, unspecified: Secondary | ICD-10-CM

## 2022-08-09 MED ORDER — PANTOPRAZOLE SODIUM 40 MG PO TBEC: 40.0000 mg | DELAYED_RELEASE_TABLET | ORAL | 3 refills | Status: DC

## 2022-08-10 ENCOUNTER — Encounter: Payer: Self-pay | Admitting: Internal Medicine

## 2022-08-20 NOTE — Telephone Encounter (Signed)
Spoke with pt. Pt is concerned because she read online that yellow stools could mean that she has a parasite. Pt states it is not something that she ate. Pt scheduled for f/u with Tye Savoy NP on 09/10/22 at 1:30 pm.

## 2022-08-21 ENCOUNTER — Other Ambulatory Visit: Payer: Self-pay | Admitting: Gastroenterology

## 2022-08-21 DIAGNOSIS — K5900 Constipation, unspecified: Secondary | ICD-10-CM

## 2022-08-25 ENCOUNTER — Other Ambulatory Visit: Payer: Self-pay

## 2022-08-25 DIAGNOSIS — K5900 Constipation, unspecified: Secondary | ICD-10-CM

## 2022-08-25 MED ORDER — LINZESS 290 MCG PO CAPS: 290.0000 ug | ORAL_CAPSULE | Freq: Every day | ORAL | 3 refills | Status: DC

## 2022-08-25 NOTE — Telephone Encounter (Signed)
Medication was resent to pharmacy and pt is aware. See 10/23 mychart message.

## 2022-08-25 NOTE — Addendum Note (Signed)
Addended by: Berniece Salines A on: 08/25/2022 03:40 PM   Modules accepted: Orders

## 2022-08-25 NOTE — Telephone Encounter (Signed)
Received call from [facility] [phone] [place], [place] - [address] stating that they did not receive the prescription. Prescription phoned in.

## 2022-08-28 ENCOUNTER — Other Ambulatory Visit: Payer: Self-pay | Admitting: Medical

## 2022-08-28 DIAGNOSIS — E1169 Type 2 diabetes mellitus with other specified complication: Secondary | ICD-10-CM

## 2022-09-04 IMAGING — DX DG HIP (WITH OR WITHOUT PELVIS) 2-3V*R*
3 series · 3 of 3 positions shown · non-contrast
Comparison: 11/19/2013.

CLINICAL DATA: 80-year-old female with history of right-sided groin
pain for the past 2 weeks.

EXAM:
DG HIP (WITH OR WITHOUT PELVIS) 2-3V RIGHT

[pelvis ap]
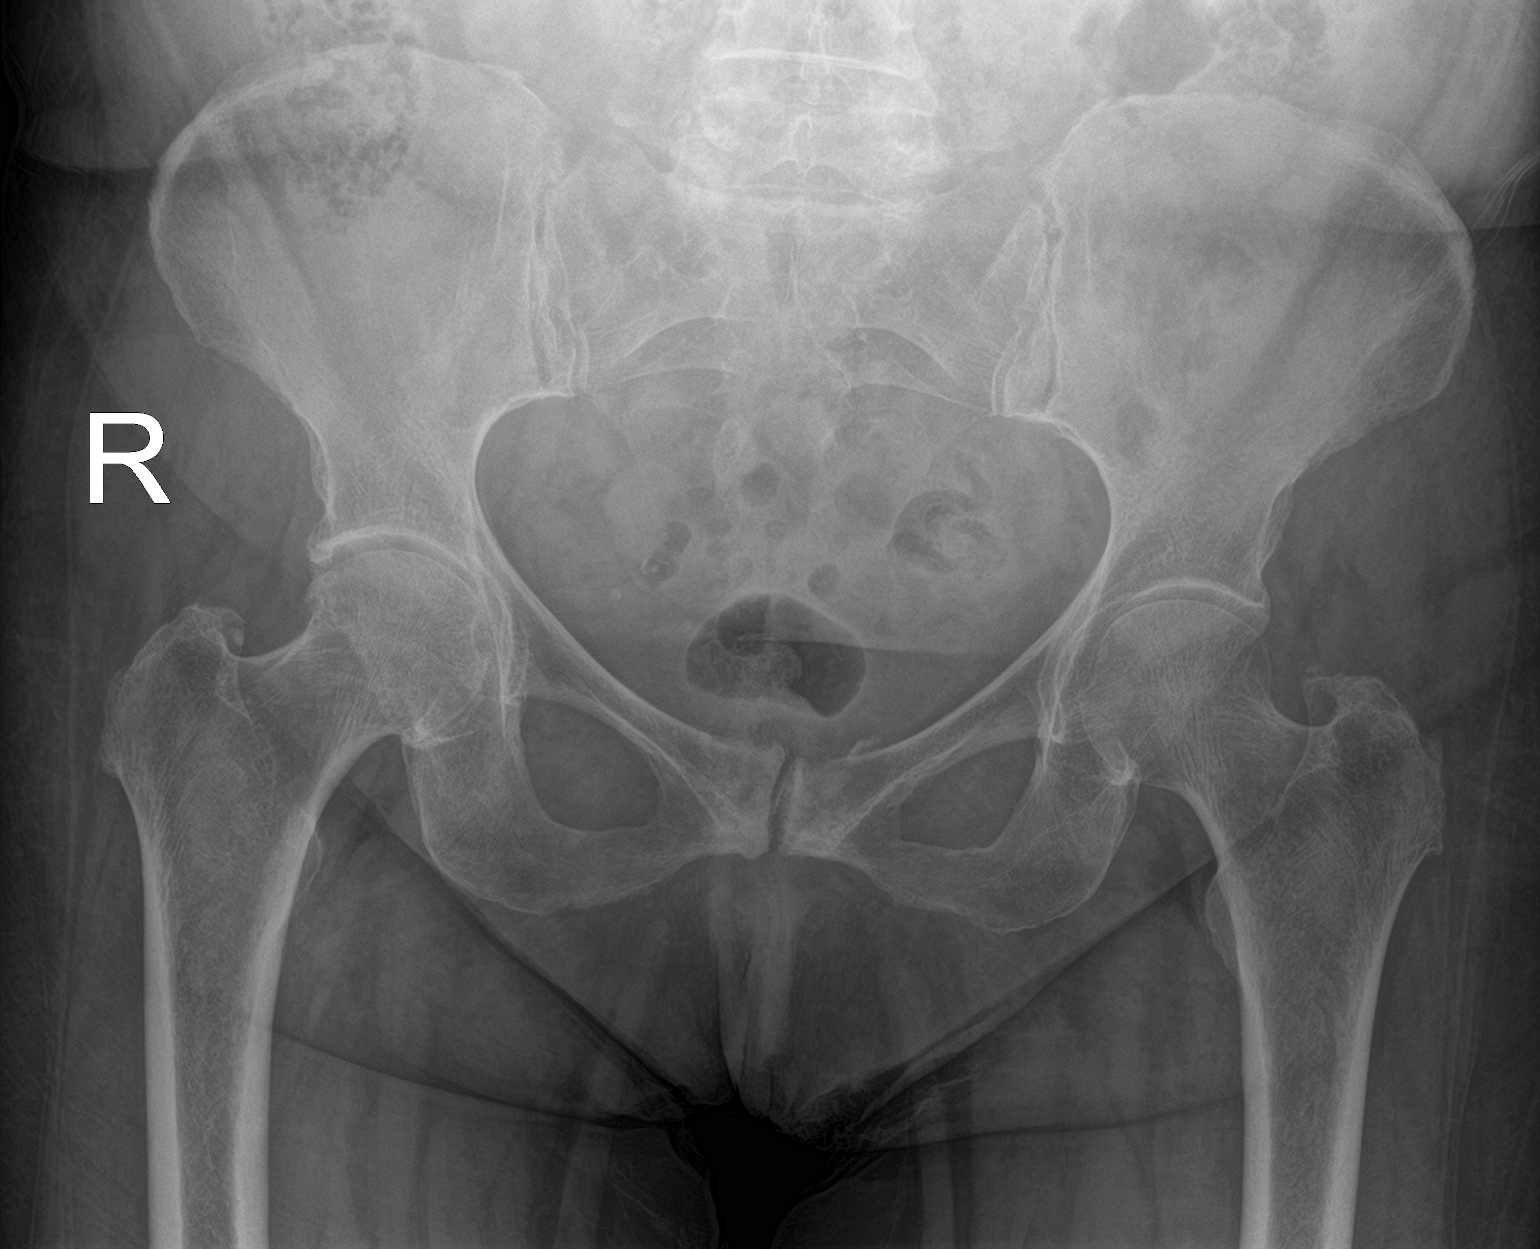

[hip ap]
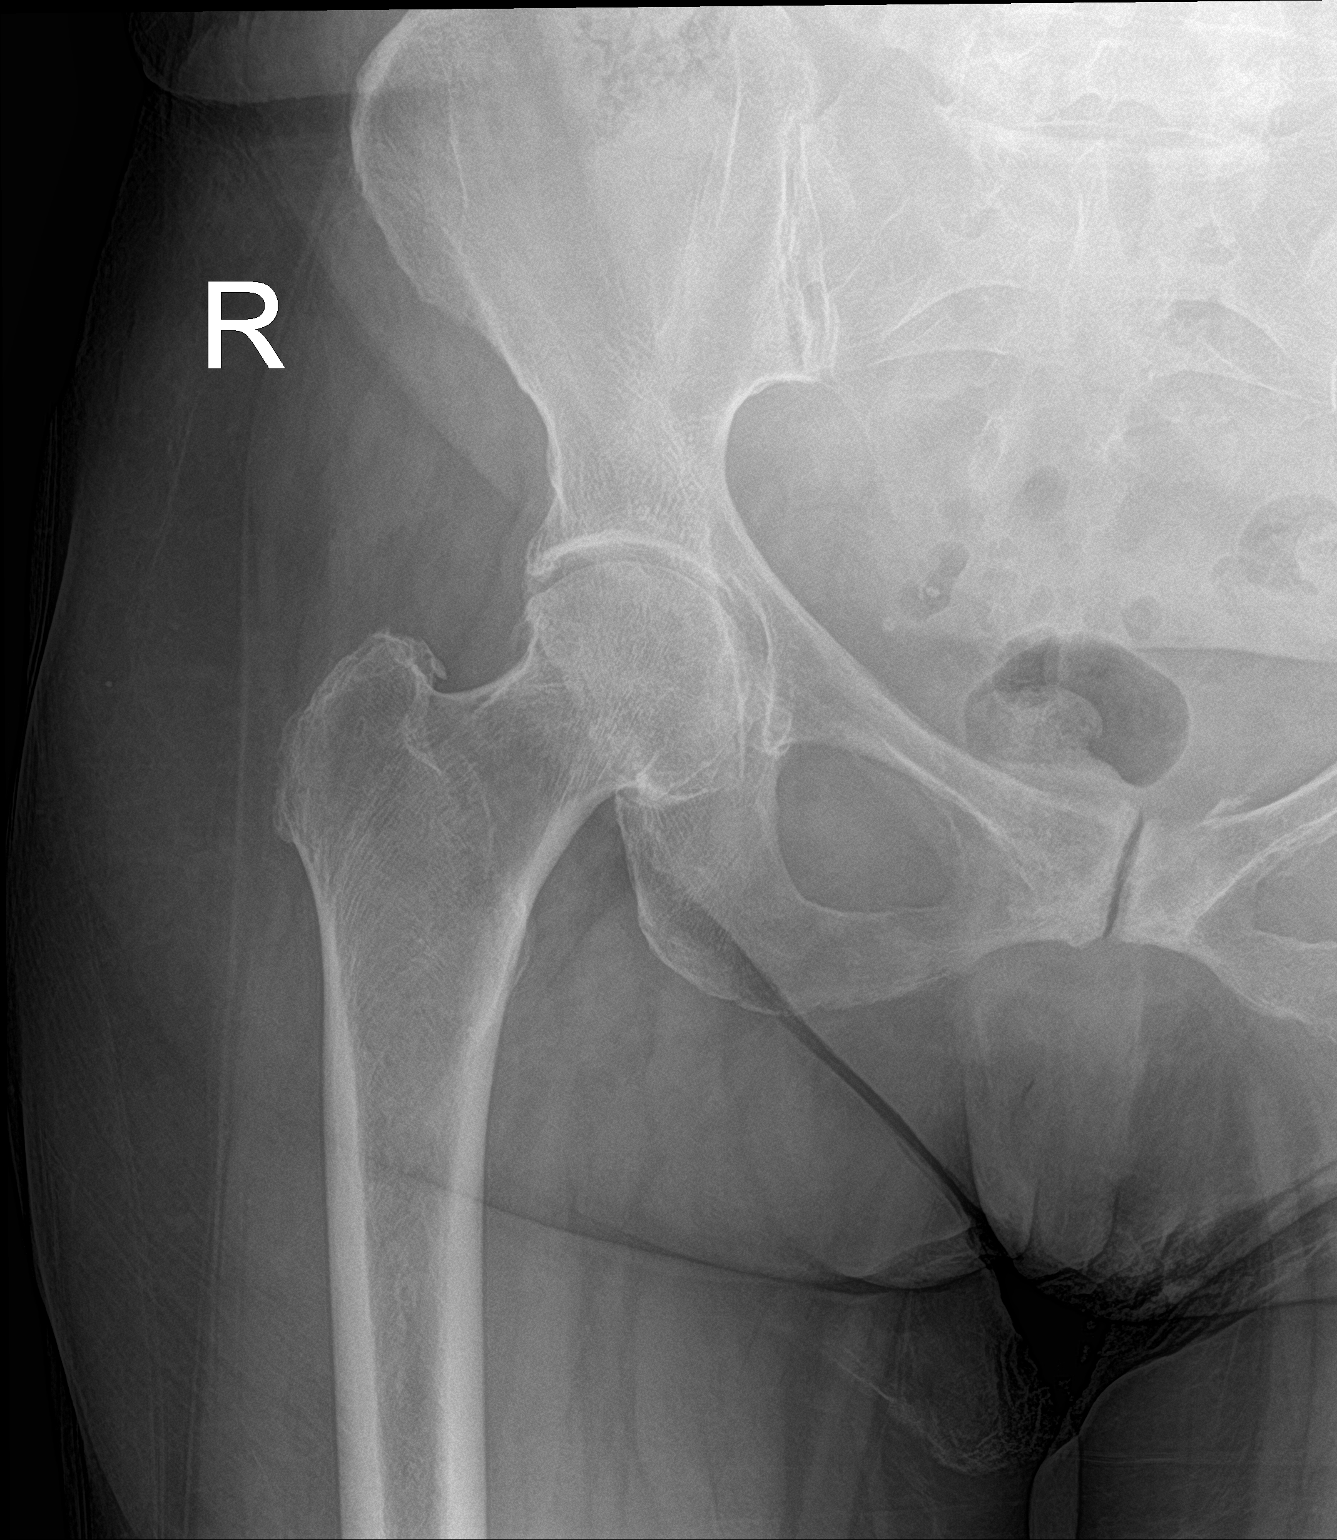

[hip lat]
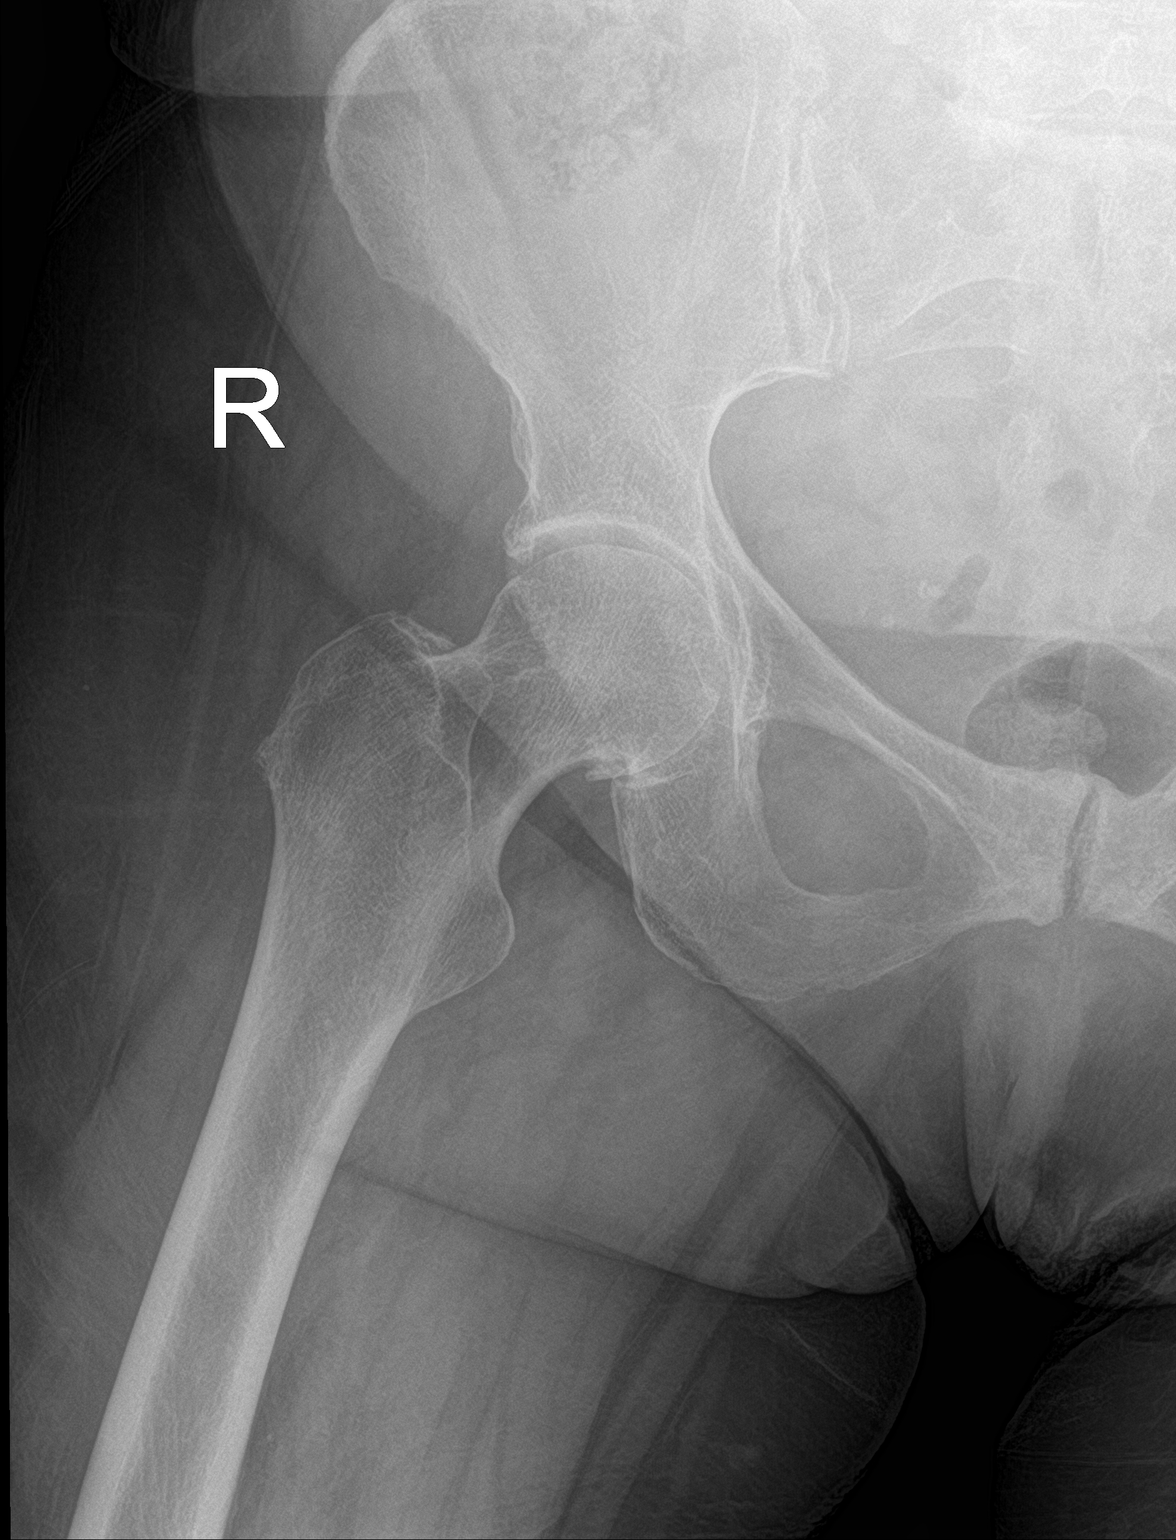

[3 of 3 positions shown; findings below may reference images not displayed]

FINDINGS: Three views of the bony pelvis in the right hip demonstrate no acute
displaced fracture of the bony pelvic ring. Bilateral proximal
femurs as visualized are intact. Right femoral head is located.
There is joint space narrowing, subchondral sclerosis, subchondral
cyst formation and osteophyte formation in the hip joints
bilaterally (right greater than left), indicative of osteoarthritis.
IMPRESSION: 1. No acute radiographic abnormality of the bony pelvis or the right
hip.
2. Bilateral hip joint osteoarthritis (moderate on the right and
mild on the left).

## 2022-09-10 ENCOUNTER — Ambulatory Visit: Payer: Medicare HMO | Admitting: Nurse Practitioner

## 2022-09-10 ENCOUNTER — Encounter: Payer: Self-pay | Admitting: Nurse Practitioner

## 2022-09-10 VITALS — BP 120/68 | HR 80 | Ht 62.0 in | Wt 162.5 lb

## 2022-09-10 DIAGNOSIS — K5904 Chronic idiopathic constipation: Secondary | ICD-10-CM

## 2022-09-10 DIAGNOSIS — K219 Gastro-esophageal reflux disease without esophagitis: Secondary | ICD-10-CM

## 2022-09-10 NOTE — Progress Notes (Signed)
Chief Complaint:  yellow stool ( now resolved)   Assessment &  Plan   # 81 yo female recently experiencing yellow stool. She was concerned about a parasite. Yellow stools have resolved. Reassurance provided.  Follow up prn  # Chronic constipation.  Continue daily fiber, Linzess QOD ( cannot afford to take everyday) and dulcolax as needed. Averages 3-4 BMs / week on this regimen.  Follow up prn  # GERD. Doing well on daily Pantoprazole. She is going to check with pharmacy about the cost of Omeprazole. If less expensive she may want to change PPI   HPI   Marissa Oliver is a 81 y.o. female known to Dr. Tarri Glenn with a past medical history significant for chronic constipation, diabetes, anxiety, depression, GERD, colon polyps, diverticulitis.  See PMH for additional history.   Marissa Oliver was last seen June 2023 for evaluation of chronic constipation with decreased frequency and urge.  She was managing well at that time with Linzess and fiber supplements a few times a week.  Also at that visit she was inquiring about how to screen for pancreatic cancer given her family history.   Interval History:  Marissa Oliver called the office on 08/20/2022 concerned about yellow stools.  She read on the Internet that yellow stools could be a sign of a parasite. She is no longer having yellow stools.   Marissa Oliver lost her brother in September. There has been some problems with his estate. She feels that stress if affecting her health.   She has chronic constipation. Taking Linzess about every other day. She tries to conserve supply due to cost. She takes a fiber supplement every day and adds dulcolax as needed. With this regimen she averages about 3 Bms a day.    Labs:     Latest Ref Rng & Units 02/03/2022    8:30 AM 12/02/2020   11:38 AM 10/11/2019   10:40 AM  CBC  WBC 3.4 - 10.8 x10E3/uL 5.6  5.8  6.1   Hemoglobin 11.1 - 15.9 g/dL 12.5  14.4  14.6   Hematocrit 34.0 - 46.6 % 37.7  42.0  43.1   Platelets 150 -  450 x10E3/uL 181  154  183        Latest Ref Rng & Units 07/12/2022   11:34 AM 04/16/2021    9:33 AM 12/02/2020   11:38 AM  Hepatic Function  Total Protein 6.0 - 8.5 g/dL 7.0  6.8  6.5   Albumin 3.7 - 4.7 g/dL 4.8  4.6  4.6   AST 0 - 40 IU/L _0 ALT 0 - 32 IU/L _1 Alk Phosphatase 44 - 121 IU/L 97  95  98   Total Bilirubin 0.0 - 1.2 mg/dL 0.2  0.3  0.3      Past Medical History:  Diagnosis Date   Atrophic vaginitis    estrace cream rx'd by Dr. Karsten Ro   Colon polyp    tubular adenoma (colonoscopy q5 yrs)   Diabetes mellitus 2005   Diverticulosis    Dyslipidemia    GAD (generalized anxiety disorder)    Hemorrhoids    Hyperlipidemia    OAB (overactive bladder)    Osteoporosis    Psoriasis    mild, sees [facility]    Past Surgical History:  Procedure Laterality Date   ABDOMINAL HYSTERECTOMY  1972   partial   APPENDECTOMY     CARDIOVASCULAR STRESS TEST  12/25/2004  EF 65%. OVERALL NORMAL STRESS CARDIOLITE. NO EVIDENCE  OF ISCHEMIA   COLONOSCOPY  08/2010   cologuard 01/2016 normal   PUBOVAGINAL SLING  04/1998   TUBAL LIGATION      Current Medications, Allergies, Family History and Social History were reviewed in [employer] record.     Current Outpatient Medications  Medication Sig Dispense Refill   albuterol (VENTOLIN HFA) 108 (90 Base) MCG/ACT inhaler Inhale 2 puffs into the lungs every 6 (six) hours as needed for wheezing or shortness of breath. 8 g 0   aspirin EC 81 MG tablet Take 1 tablet (81 mg total) by mouth every other day. Swallow whole. 90 tablet 3   Fiber POWD Take 1 Dose by mouth as needed.     glucose blood (ONETOUCH ULTRA) test strip USE AS DIRECTED TWO TIMES A DAY TO CHECK BLOOD SUGAR 100 strip 2   insulin detemir (LEVEMIR FLEXTOUCH) 100 UNIT/ML FlexPen Inject 40 Units into the skin daily. 15 mL 2   Insulin Pen Needle (B-D UF III MINI PEN NEEDLES) 31G X 5 MM MISC USE ONCE DAILY 100 each 1   LINZESS 290  MCG CAPS capsule Take 1 capsule (290 mcg total) by mouth daily before breakfast. Take 1, Every morning before breakfast 30 capsule 3   lisinopril (ZESTRIL) 5 MG tablet TAKE ONE TABLET BY MOUTH DAILY 90 tablet 3   metFORMIN (GLUCOPHAGE) 500 MG tablet Take 1 tablet (500 mg total) by mouth 2 (two) times daily with a meal. 200 tablet 1   OneTouch Delica Lancets 21Y MISC 1 each by Does not apply route in the morning and at bedtime. Dx:E11.9 100 each 2   oxybutynin (DITROPAN-XL) 5 MG 24 hr tablet Take 1 tablet (5 mg total) by mouth 2 (two) times daily. Take 5 mg by mouth twice a day 60 tablet 0   pantoprazole (PROTONIX) 40 MG tablet Take 1 tablet (40 mg total) by mouth every morning. 90 tablet 3   polyethylene glycol powder (GLYCOLAX/MIRALAX) 17 GM/SCOOP powder Take 17 g by mouth daily.     rosuvastatin (CRESTOR) 20 MG tablet Take 1 tablet (20 mg total) by mouth daily. 90 tablet 3   trimethoprim (TRIMPEX) 100 MG tablet Take 100 mg by mouth daily.     vitamin B-6 (PYRIDOXINE) 25 MG tablet Take 1 tablet (25 mg total) by mouth daily. 90 tablet 0   No current facility-administered medications for this visit.    Review of Systems: No chest pain. No shortness of breath. No urinary complaints.    Physical Exam  Wt Readings from Last 3 Encounters:  09/10/22 162 lb 8 oz (73.7 kg)  07/12/22 164 lb 9.6 oz (74.7 kg)  06/30/22 164 lb 11.2 oz (74.7 kg)    BP 120/68   Pulse 80   Ht _0  (1.575 m)   Wt 162 lb 8 oz (73.7 kg)   SpO2 98%   BMI 29.72 kg/m  Constitutional:  Generally well appearing female in no acute distress. Psychiatric: Pleasant. Normal mood and affect. Behavior is normal. EENT: Pupils normal.  Conjunctivae are normal. No scleral icterus. Neck supple.  Cardiovascular: Normal rate, regular rhythm. No edema Pulmonary/chest: Effort normal and breath sounds normal. No wheezing, rales or rhonchi. Abdominal: Soft, nondistended, nontender. Bowel sounds active throughout. There are no masses  palpable. No hepatomegaly. Neurological: Alert and oriented to person place and time. Skin: Skin is warm and dry. No rashes noted.  Tye Savoy, NP  09/10/2022, 1:35 PM

## 2022-09-10 NOTE — Patient Instructions (Addendum)
Omeprazole 40 mg may be less expensive for you than the Pantoprazole. You can check with your insurance company to see if it is on their formulary. If so then you can contact your  pharmacy about the cost.  You have been given samples of Linzess.  Call if you have any more yellow stools

## 2022-09-14 MED ORDER — OMEPRAZOLE 40 MG PO CPDR: DELAYED_RELEASE_CAPSULE | ORAL | 3 refills | Status: DC

## 2022-09-15 NOTE — Telephone Encounter (Signed)
Omeprazole was sent to pt's pharmacy on 11/14.

## 2022-10-05 ENCOUNTER — Other Ambulatory Visit: Payer: Self-pay | Admitting: *Deleted

## 2022-10-05 MED ORDER — BD PEN NEEDLE MINI U/F 31G X 5 MM MISC: 5 refills | Status: DC

## 2022-10-12 ENCOUNTER — Ambulatory Visit: Payer: Medicare HMO | Admitting: Medical

## 2022-10-14 ENCOUNTER — Other Ambulatory Visit: Payer: Self-pay | Admitting: Nurse Practitioner

## 2022-10-14 NOTE — Telephone Encounter (Signed)
Please advise if you want to continue to refill this medication.

## 2022-10-15 ENCOUNTER — Ambulatory Visit: Payer: Medicare HMO | Admitting: Medical

## 2022-10-18 ENCOUNTER — Ambulatory Visit (INDEPENDENT_AMBULATORY_CARE_PROVIDER_SITE_OTHER): Payer: Medicare HMO | Admitting: Medical

## 2022-10-18 ENCOUNTER — Encounter: Payer: Self-pay | Admitting: Medical

## 2022-10-18 VITALS — BP 118/70 | HR 61 | Temp 98.1°F | Wt 163.2 lb

## 2022-10-18 DIAGNOSIS — E785 Hyperlipidemia, unspecified: Secondary | ICD-10-CM

## 2022-10-18 DIAGNOSIS — I739 Peripheral vascular disease, unspecified: Secondary | ICD-10-CM | POA: Diagnosis not present

## 2022-10-18 DIAGNOSIS — E1169 Type 2 diabetes mellitus with other specified complication: Secondary | ICD-10-CM

## 2022-10-18 DIAGNOSIS — R300 Dysuria: Secondary | ICD-10-CM | POA: Diagnosis not present

## 2022-10-18 DIAGNOSIS — Z794 Long term (current) use of insulin: Secondary | ICD-10-CM | POA: Diagnosis not present

## 2022-10-18 DIAGNOSIS — Z87440 Personal history of urinary (tract) infections: Secondary | ICD-10-CM | POA: Diagnosis not present

## 2022-10-18 LAB — POCT URINALYSIS DIP (PROADVANTAGE DEVICE)
Bilirubin, UA: NEGATIVE
Blood, UA: NEGATIVE
Glucose, UA: NEGATIVE mg/dL
Ketones, POC UA: NEGATIVE mg/dL
Leukocytes, UA: NEGATIVE
Nitrite, UA: NEGATIVE
Protein Ur, POC: NEGATIVE mg/dL
Specific Gravity, Urine: 1.02
Urobilinogen, Ur: 0.2
pH, UA: 5 (ref 5.0–8.0)

## 2022-10-18 LAB — POCT GLYCOSYLATED HEMOGLOBIN (HGB A1C): Hemoglobin A1C: 8.9 % — AB (ref 4.0–5.6)

## 2022-10-18 NOTE — Progress Notes (Signed)
Subjective: Chief Complaint  Patient presents with   other    Possible UTI and diabetes check? Pain and pressure, little burning with urination, some frequency,    Concerns: She notes possible UTI.  Started this morning with burning with urination, lower abdomen pressure. No fever, no blood in urine.  No urinary frequency.  Having some urgency.  No cloudy urine, no odor of urine.    Diabetes - 101 this morning but in past 2 weeks has had a few low readings.  Taking Metformin '500mg'$  BID.  Was using Levemir 42 units, but just cut back to 38 since last week as she emailed in about low readings.    Recently had seem some low readings under 70 including 1 reading at 47.   Taking lisinopril for kidney protection  Hyperlipidemia - compliant with Crestor without c/o  Past Medical History:  Diagnosis Date   Atrophic vaginitis    estrace cream rx'd by Dr. Karsten Ro   Colon polyp    tubular adenoma (colonoscopy q5 yrs)   Diabetes mellitus 2005   Diverticulosis    Dyslipidemia    GAD (generalized anxiety disorder)    Hemorrhoids    Hyperlipidemia    OAB (overactive bladder)    Osteoporosis    Psoriasis    mild, sees [facility]   Current Outpatient Medications on File Prior to Visit  Medication Sig Dispense Refill   albuterol (VENTOLIN HFA) 108 (90 Base) MCG/ACT inhaler Inhale 2 puffs into the lungs every 6 (six) hours as needed for wheezing or shortness of breath. 8 g 0   aspirin EC 81 MG tablet Take 1 tablet (81 mg total) by mouth every other day. Swallow whole. 90 tablet 3   Fiber POWD Take 1 Dose by mouth as needed.     glucose blood (ONETOUCH ULTRA) test strip USE AS DIRECTED TWO TIMES A DAY TO CHECK BLOOD SUGAR 100 strip 2   insulin detemir (LEVEMIR FLEXTOUCH) 100 UNIT/ML FlexPen Inject 40 Units into the skin daily. 15 mL 2   Insulin Pen Needle (B-D UF III MINI PEN NEEDLES) 31G X 5 MM MISC USE ONCE DAILY 100 each 5   LINZESS 290 MCG CAPS capsule Take 1 capsule (290 mcg total) by  mouth daily before breakfast. Take 1, Every morning before breakfast 30 capsule 3   lisinopril (ZESTRIL) 5 MG tablet TAKE ONE TABLET BY MOUTH DAILY 90 tablet 3   metFORMIN (GLUCOPHAGE) 500 MG tablet Take 1 tablet (500 mg total) by mouth 2 (two) times daily with a meal. 200 tablet 1   omeprazole (PRILOSEC) 40 MG capsule Take 1 capsule (40 mg total) by mouth every morning 90 capsule 3   OneTouch Delica Lancets 95J MISC 1 each by Does not apply route in the morning and at bedtime. Dx:E11.9 100 each 2   oxybutynin (DITROPAN-XL) 5 MG 24 hr tablet Take 1 tablet (5 mg total) by mouth 2 (two) times daily. Take 5 mg by mouth twice a day 60 tablet 0   rosuvastatin (CRESTOR) 20 MG tablet Take 1 tablet (20 mg total) by mouth daily. 90 tablet 3   trimethoprim (TRIMPEX) 100 MG tablet Take 100 mg by mouth daily.     vitamin B-6 (PYRIDOXINE) 25 MG tablet Take 1 tablet (25 mg total) by mouth daily. 90 tablet 0   polyethylene glycol powder (GLYCOLAX/MIRALAX) 17 GM/SCOOP powder Take 17 g by mouth daily. (Patient not taking: Reported on 10/18/2022)     No current facility-administered medications on file  prior to visit.   ROS as in subjective    Objective: BP 118/70   Pulse 61   Temp 98.1 F (36.7 C)   Wt 163 lb 3.2 oz (74 kg)   BMI 29.85 kg/m   General appearence: alert, no distress, WD/WN,  Heart: RRR, normal S1, S2, no murmurs Lungs: CTA bilaterally, no wheezes, rhonchi, or rales Abdomen: +bs, soft, non tender, non distended, no masses, no hepatomegaly, no splenomegaly Pulses: 2+ symmetric, upper and lower extremities, normal cap refill  Diabetic Foot Exam - Simple   Simple Foot Form Diabetic Foot exam was performed with the following findings: Yes 10/18/2022 10:59 AM  Visual Inspection No deformities, no ulcerations, no other skin breakdown bilaterally: Yes Sensation Testing Intact to touch and monofilament testing bilaterally: Yes Pulse Check See comments: Yes Comments 1+ pedal pulses        Assessment: Encounter Diagnoses  Name Primary?   Type 2 diabetes mellitus with other specified complication, with long-term current use of insulin (HCC) Yes   Dyslipidemia associated with type 2 diabetes mellitus ([place])    PAD (peripheral artery disease) ([place])    History of recurrent UTIs    Dysuria      Plan: Diabetes Continue routine diabetic follow-up, see eye doctor yearly, follow-up here every 6 months Continue daily foot checks Continue metformin 500 mg twice daily.  She recently cut back on Levemir to 38 units daily due to some low sugar readings.  Counseled on Hypoglycemia Continue glucose monitoring HgbA1C 8.9 % today, not at goal Consider the following medicines to add to metformin to help get your sugars under better control.  These are all in the same class of medicine below but some may be genetics and may be name brand more expensive.  Check insurance and let me know which when you want to add: Saxagliptin / Onglyza Sitaglipitin / Januvia Alogliptin / Nesina Linagliptin / Tradjenta  Hyperlipidemia associate with diabetes-continue statin, reviewed most recent labs less than-year-old  PAD-continue statin, regular walking for exercise  Dysuria, history of recurrent UTI-urinalysis today, await culture   Marissa Oliver was seen today for other.  Diagnoses and all orders for this visit:  Type 2 diabetes mellitus with other specified complication, with long-term current use of insulin (HCC) -     Microalbumin/Creatinine Ratio, Urine -     HgB A1c  Dyslipidemia associated with type 2 diabetes mellitus (HCC)  PAD (peripheral artery disease) (HCC)  History of recurrent UTIs  Dysuria -     POCT Urinalysis DIP (Proadvantage Device)    F/u pending labs

## 2022-10-18 NOTE — Patient Instructions (Signed)
Diabetes Continue routine diabetic follow-up, see eye doctor yearly, follow-up here every 6 months Continue daily foot checks Continue metformin 500 mg twice daily.  She recently cut back on Levemir to 38 units daily due to some low sugar readings.  Counseled on Hypoglycemia Continue glucose monitoring HgbA1C 8.9 % today, not at goal Consider the following medicines to add to metformin to help get your sugars under better control.  These are all in the same class of medicine below but some may be genetics and may be namebrand more expensive.  Check insurance and let me know which when you want to add: Saxagliptin / Onglyza Sitaglipitin / Januvia Alogliptin / Nesina Linagliptin / [place]

## 2022-10-19 ENCOUNTER — Telehealth: Payer: Self-pay | Admitting: Internal Medicine

## 2022-10-19 ENCOUNTER — Ambulatory Visit: Payer: Medicare HMO | Admitting: Medical

## 2022-10-19 LAB — MICROALBUMIN / CREATININE URINE RATIO
Creatinine, Urine: 166.5 mg/dL
Microalb/Creat Ratio: 12 mg/g creat (ref 0–29)
Microalbumin, Urine: 19.7 ug/mL

## 2022-10-19 NOTE — Telephone Encounter (Signed)
Refill request for Oxybutrin ER '5mg'$  to [facility]. Ok to refill?

## 2022-10-20 ENCOUNTER — Other Ambulatory Visit: Payer: Self-pay | Admitting: Medical

## 2022-10-20 LAB — URINE CULTURE

## 2022-10-20 MED ORDER — NITROFURANTOIN MONOHYD MACRO 100 MG PO CAPS: 100.0000 mg | ORAL_CAPSULE | Freq: Two times a day (BID) | ORAL | 0 refills | Status: DC

## 2022-10-20 MED ORDER — OXYBUTYNIN CHLORIDE ER 5 MG PO TB24: 5.0000 mg | ORAL_TABLET | Freq: Two times a day (BID) | ORAL | 1 refills | Status: DC

## 2022-10-20 NOTE — Telephone Encounter (Signed)
Oxybutynin ER 24hr '5mg'$  is covered at once a day

## 2022-10-20 NOTE — Telephone Encounter (Signed)
Sent through cover my meds. Waiting on response

## 2022-10-20 NOTE — Telephone Encounter (Signed)
Per pt's plan, insurance will only pay for 30 days in a 30 day period. Please advise if you want to appeal this for 60 tablets in a 30 day supply

## 2022-10-20 NOTE — Progress Notes (Signed)
Results sent through MyChart

## 2022-10-20 NOTE — Telephone Encounter (Signed)
Shane sent in ER to pharmacy once a day

## 2022-10-20 NOTE — Telephone Encounter (Signed)
   I see GI did this back in June. Please advise

## 2022-10-21 ENCOUNTER — Other Ambulatory Visit: Payer: Self-pay | Admitting: Medical

## 2022-10-21 MED ORDER — OXYBUTYNIN CHLORIDE ER 5 MG PO TB24: 5.0000 mg | ORAL_TABLET | Freq: Every day | ORAL | 0 refills | Status: DC

## 2022-10-26 ENCOUNTER — Ambulatory Visit: Payer: Medicare HMO | Admitting: Medical

## 2022-10-28 NOTE — Progress Notes (Signed)
Reviewed and agree with management plans. ? ?Marissa Wickens L. Bradshaw Minihan, MD, MPH  ?

## 2022-10-29 ENCOUNTER — Encounter: Payer: Self-pay | Admitting: Internal Medicine

## 2022-11-07 ENCOUNTER — Ambulatory Visit (HOSPITAL_COMMUNITY): Payer: Self-pay

## 2022-11-10 ENCOUNTER — Ambulatory Visit (INDEPENDENT_AMBULATORY_CARE_PROVIDER_SITE_OTHER): Payer: Medicare HMO | Admitting: Medical

## 2022-11-10 ENCOUNTER — Encounter: Payer: Self-pay | Admitting: Medical

## 2022-11-10 VITALS — BP 118/70 | HR 78 | Temp 98.1°F | Wt 162.8 lb

## 2022-11-10 DIAGNOSIS — R300 Dysuria: Secondary | ICD-10-CM

## 2022-11-10 DIAGNOSIS — N390 Urinary tract infection, site not specified: Secondary | ICD-10-CM | POA: Diagnosis not present

## 2022-11-10 LAB — POCT URINALYSIS DIP (CLINITEK)
Bilirubin, UA: NEGATIVE
Glucose, UA: NEGATIVE mg/dL
Ketones, POC UA: NEGATIVE mg/dL
Leukocytes, UA: NEGATIVE
Nitrite, UA: NEGATIVE
POC PROTEIN,UA: NEGATIVE
Spec Grav, UA: >=1.03 — AB (ref 1.010–1.025)
Urobilinogen, UA: 0.2 E.U./dL
pH, UA: 6 (ref 5.0–8.0)

## 2022-11-10 MED ORDER — AMOXICILLIN 875 MG PO TABS: 875.0000 mg | ORAL_TABLET | Freq: Two times a day (BID) | ORAL | 0 refills | Status: AC

## 2022-11-10 NOTE — Progress Notes (Signed)
Subjective:  Marissa Oliver is a 82 y.o. female who presents for Chief Complaint  Patient presents with   other    Possible UTI, really bad yesterday burning, darker urine,      Here for possible UTI.  Yesterday felt bad, burning with urination, darker urine than typically.   No fever, no body ache or chills.   Has some urinary pressure.  No back pain.   No nausea or vomiting. On vaginal or vulvar redness or itching. No vaginal discharge.   No other aggravating or relieving factors.    No other c/o.  Past Medical History:  Diagnosis Date   Atrophic vaginitis    estrace cream rx'd by Dr. Karsten Ro   Colon polyp    tubular adenoma (colonoscopy q5 yrs)   Diabetes mellitus 2005   Diverticulosis    Dyslipidemia    GAD (generalized anxiety disorder)    Hemorrhoids    Hyperlipidemia    OAB (overactive bladder)    Osteoporosis    Psoriasis    mild, sees [facility]   Current Outpatient Medications on File Prior to Visit  Medication Sig Dispense Refill   albuterol (VENTOLIN HFA) 108 (90 Base) MCG/ACT inhaler Inhale 2 puffs into the lungs every 6 (six) hours as needed for wheezing or shortness of breath. 8 g 0   aspirin EC 81 MG tablet Take 1 tablet (81 mg total) by mouth every other day. Swallow whole. 90 tablet 3   Fiber POWD Take 1 Dose by mouth as needed.     glucose blood (ONETOUCH ULTRA) test strip USE AS DIRECTED TWO TIMES A DAY TO CHECK BLOOD SUGAR 100 strip 2   insulin detemir (LEVEMIR FLEXTOUCH) 100 UNIT/ML FlexPen Inject 40 Units into the skin daily. 15 mL 2   Insulin Pen Needle (B-D UF III MINI PEN NEEDLES) 31G X 5 MM MISC USE ONCE DAILY 100 each 5   LINZESS 290 MCG CAPS capsule Take 1 capsule (290 mcg total) by mouth daily before breakfast. Take 1, Every morning before breakfast 30 capsule 3   lisinopril (ZESTRIL) 5 MG tablet TAKE ONE TABLET BY MOUTH DAILY 90 tablet 3   metFORMIN (GLUCOPHAGE) 500 MG tablet Take 1 tablet (500 mg total) by mouth 2 (two) times daily with a  meal. 200 tablet 1   nitrofurantoin, macrocrystal-monohydrate, (MACROBID) 100 MG capsule Take 1 capsule (100 mg total) by mouth 2 (two) times daily. (Patient not taking: Reported on 11/10/2022) 14 capsule 0   omeprazole (PRILOSEC) 40 MG capsule Take 1 capsule (40 mg total) by mouth every morning 90 capsule 3   OneTouch Delica Lancets 67E MISC 1 each by Does not apply route in the morning and at bedtime. Dx:E11.9 100 each 2   oxybutynin (DITROPAN-XL) 5 MG 24 hr tablet Take 1 tablet (5 mg total) by mouth at bedtime. 90 tablet 0   polyethylene glycol powder (GLYCOLAX/MIRALAX) 17 GM/SCOOP powder Take 17 g by mouth daily.     rosuvastatin (CRESTOR) 20 MG tablet Take 1 tablet (20 mg total) by mouth daily. 90 tablet 3   trimethoprim (TRIMPEX) 100 MG tablet Take 100 mg by mouth daily.     vitamin B-6 (PYRIDOXINE) 25 MG tablet Take 1 tablet (25 mg total) by mouth daily. 90 tablet 0   No current facility-administered medications on file prior to visit.     The following portions of the patient's history were reviewed and updated as appropriate: allergies, current medications, past family history, past medical history, past  social history, past surgical history and problem list.  ROS Otherwise as in subjective above     Objective: BP 118/70   Pulse 78   Temp 98.1 F (36.7 C)   Wt 162 lb 12.8 oz (73.8 kg)   BMI 29.78 kg/m   General appearance: alert, no distress, well developed, well nourished Abdomen: +bs, soft, non tender, non distended, no masses, no hepatomegaly, no splenomegaly Back: nontender Pulses: 2+ radial pulses, 2+ pedal pulses, normal cap refill Ext: no edema   Assessment: Encounter Diagnoses  Name Primary?   Burning with urination Yes   Chronic urinary tract infection      Plan: Begin medication below.  Advise good hydration.  Given her chronic UTI issues already on prophylactic antibiotic trimethoprim and recent treatment for UTI in December 2023 with Macrobid,  recommend she go ahead and get back in with urology now for further evaluation and treatment options  Marissa Oliver was seen today for other.  Diagnoses and all orders for this visit:  Burning with urination -     POCT URINALYSIS DIP (CLINITEK)  Chronic urinary tract infection  Other orders -     amoxicillin (AMOXIL) 875 MG tablet; Take 1 tablet (875 mg total) by mouth 2 (two) times daily for 10 days.    Follow up: with urology soon

## 2022-11-12 ENCOUNTER — Other Ambulatory Visit: Payer: Self-pay | Admitting: Medical

## 2022-11-12 MED ORDER — PHENAZOPYRIDINE HCL 100 MG PO TABS: 100.0000 mg | ORAL_TABLET | Freq: Three times a day (TID) | ORAL | 0 refills | Status: DC | PRN

## 2022-11-17 DIAGNOSIS — B338 Other specified viral diseases: Secondary | ICD-10-CM | POA: Diagnosis not present

## 2022-11-18 DIAGNOSIS — R300 Dysuria: Secondary | ICD-10-CM | POA: Diagnosis not present

## 2022-11-18 DIAGNOSIS — N952 Postmenopausal atrophic vaginitis: Secondary | ICD-10-CM | POA: Diagnosis not present

## 2022-11-18 DIAGNOSIS — R8271 Bacteriuria: Secondary | ICD-10-CM | POA: Diagnosis not present

## 2022-12-23 ENCOUNTER — Telehealth: Payer: Self-pay | Admitting: Medical

## 2023-01-02 ENCOUNTER — Encounter (HOSPITAL_COMMUNITY): Payer: Self-pay

## 2023-01-02 ENCOUNTER — Ambulatory Visit (HOSPITAL_COMMUNITY)
Admission: RE | Admit: 2023-01-02 | Discharge: 2023-01-02 | Disposition: A | Discharge status: Home / Self Care | Payer: Medicare HMO | Source: Ambulatory Visit | Attending: Internal Medicine | Admitting: Internal Medicine

## 2023-01-02 VITALS — BP 113/71 | HR 73 | Temp 98.2°F | Resp 16 | Ht 62.0 in | Wt 165.0 lb

## 2023-01-02 DIAGNOSIS — N390 Urinary tract infection, site not specified: Secondary | ICD-10-CM

## 2023-01-02 DIAGNOSIS — R300 Dysuria: Secondary | ICD-10-CM

## 2023-01-02 LAB — POCT URINALYSIS DIPSTICK, ED / UC
Bilirubin Urine: NEGATIVE
Glucose, UA: NEGATIVE mg/dL
Nitrite: POSITIVE — AB
Protein, ur: 30 mg/dL — AB
Specific Gravity, Urine: 1.02 (ref 1.005–1.030)
Urobilinogen, UA: 1 mg/dL (ref 0.0–1.0)
pH: 7 (ref 5.0–8.0)

## 2023-01-02 MED ORDER — CEPHALEXIN 500 MG PO CAPS: 500.0000 mg | ORAL_CAPSULE | Freq: Two times a day (BID) | ORAL | 0 refills | Status: AC

## 2023-01-02 NOTE — ED Triage Notes (Signed)
Patient here today for urinary frequency, pain in urination, and burning X 1 day. Has not taken anything.

## 2023-01-08 NOTE — ED Provider Notes (Signed)
[place]    CSN: [id number] Arrival date & time: 01/02/23  1045      History   Chief Complaint Chief Complaint  Patient presents with   Urinary Frequency    burning - Entered by patient    HPI Marissa Oliver is a 82 y.o. female.   Patient presents to urgent care for evaluation of urinary frequency, urgency, and dysuria that started yesterday. She has a history of chronic urinary tract infection and states symptoms improved significantly after she was treated for UTI last 1 month ago but returned yesterday. She drinks plenty of water and denies frequent intake of known urinary irritants. Most recently received amoxicillin for acute UTI 1 month ago. Currently takes trimethoprim daily for UTI prophylaxis. Denies vaginal itching, odor, discharge, and rash. History of diabetes, states sugars have been well controlled. Does not take SGLT-2 inhibitor. Denies fever, chills, abdominal pain, flank pain, new low back pain, dizziness, headache, nausea, vomiting, gross hematuria, and recent falls/confusion. Has not attempted use of any OTC medicines to help before arrival to urgent care.  The history is provided by the patient. No language interpreter was used.  Urinary Frequency    Past Medical History:  Diagnosis Date   Atrophic vaginitis    estrace cream rx'd by Dr. Karsten Ro   Colon polyp    tubular adenoma (colonoscopy q5 yrs)   Diabetes mellitus 2005   Diverticulosis    Dyslipidemia    GAD (generalized anxiety disorder)    Hemorrhoids    Hyperlipidemia    OAB (overactive bladder)    Osteoporosis    Psoriasis    mild, sees [facility]    Patient Active Problem List   Diagnosis Date Noted   Groin pain, right 02/24/2022   Pain of right hip 02/24/2022   Pain due to onychomycosis of toenails of both feet 10/05/2021   Enlarged and hypertrophic nails 07/20/2021   Needs flu shot 07/20/2021   Advanced directives, counseling/discussion 04/15/2021   PAD  (peripheral artery disease) ([place]) 04/15/2021   Encounter for health maintenance examination in adult 04/15/2021   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   Chronic idiopathic constipation 12/17/2020   Diverticulosis of colon 12/17/2020   Obesity 12/17/2020   Need for pneumococcal vaccination 12/02/2020   Hearing loss 12/02/2020   Estrogen deficiency 12/02/2020   Post-menopausal 12/02/2020   Encounter for screening for vascular disease 12/02/2020   Screening for heart disease 12/02/2020   History of recurrent UTIs 11/13/2020   Dyslipidemia associated with type 2 diabetes mellitus ([place]) 07/09/2020   Senile purpura ([place]) 04/07/2020   Insomnia 04/07/2020   Psoriasis 04/07/2020   Decreased pulses in feet 12/19/2018   Former smoker 12/09/2016   History of colonic polyps 04/29/2016   Medicare annual wellness visit, subsequent 04/29/2016   Screening for cancer 04/29/2016   Vaccine counseling 04/29/2016   Type 2 diabetes mellitus with other specified complication ([place]) [id number]   Need for influenza vaccination 08/04/2015   Vitamin D deficiency 10/15/2014   Atrophic vaginitis 01/29/2014   Urinary incontinence 01/29/2014   Generalized anxiety disorder 01/29/2014   Overactive bladder 01/29/2014   Osteoporosis 08/12/2011    Past Surgical History:  Procedure Laterality Date   ABDOMINAL HYSTERECTOMY  1972   partial   APPENDECTOMY     CARDIOVASCULAR STRESS TEST  12/25/2004   EF 65%. OVERALL NORMAL STRESS CARDIOLITE. NO EVIDENCE  OF ISCHEMIA   COLONOSCOPY  08/2010   cologuard 01/2016 normal   PUBOVAGINAL SLING  04/1998  TUBAL LIGATION      OB History     Gravida  3   Para  3   Term      Preterm      AB      Living  3      SAB      IAB      Ectopic      Multiple      Live Births               Home Medications    Prior to Admission medications   Medication Sig Start Date End Date Taking? Authorizing Provider  albuterol (VENTOLIN HFA) 108 (90 Base) MCG/ACT  inhaler Inhale 2 puffs into the lungs every 6 (six) hours as needed for wheezing or shortness of breath. 06/30/22  Yes Tysinger, Camelia Eng, PA-C  aspirin EC 81 MG tablet Take 1 tablet (81 mg total) by mouth every other day. Swallow whole. 07/20/21  Yes Tysinger, Camelia Eng, PA-C  cephALEXin (KEFLEX) 500 MG capsule Take 1 capsule (500 mg total) by mouth 2 (two) times daily for 7 days. 01/02/23 01/09/23 Yes Talbot Grumbling, FNP  Fiber POWD Take 1 Dose by mouth as needed.   Yes [provider]  glucose blood (ONETOUCH ULTRA) test strip USE AS DIRECTED TWO TIMES A DAY TO CHECK BLOOD SUGAR 08/30/22  Yes Tysinger, Camelia Eng, PA-C  insulin detemir (LEVEMIR FLEXTOUCH) 100 UNIT/ML FlexPen Inject 40 Units into the skin daily. 10/14/21  Yes Tysinger, Camelia Eng, PA-C  Insulin Pen Needle (B-D UF III MINI PEN NEEDLES) 31G X 5 MM MISC USE ONCE DAILY 10/05/22  Yes Tysinger, Camelia Eng PA-C  LINZESS 290 MCG CAPS capsule Take 1 capsule (290 mcg total) by mouth daily before breakfast. Take 1, Every morning before breakfast 08/25/22  Yes Willia Craze, NP  lisinopril (ZESTRIL) 5 MG tablet TAKE ONE TABLET BY MOUTH DAILY 01/25/22  Yes Tysinger, Camelia Eng, PA-C  metFORMIN (GLUCOPHAGE) 500 MG tablet Take 1 tablet (500 mg total) by mouth 2 (two) times daily with a meal. 06/28/22  Yes Tysinger, Camelia Eng, PA-C  nitrofurantoin, macrocrystal-monohydrate, (MACROBID) 100 MG capsule Take 1 capsule (100 mg total) by mouth 2 (two) times daily. 10/20/22  Yes Tysinger, Camelia Eng, PA-C  omeprazole (PRILOSEC) 40 MG capsule Take 1 capsule (40 mg total) by mouth every morning 09/14/22  Yes Willia Craze, NP  OneTouch Delica Lancets 99991111 MISC 1 each by Does not apply route in the morning and at bedtime. Dx:E11.9 04/20/22  Yes Tysinger, Camelia Eng, PA-C  oxybutynin (DITROPAN-XL) 5 MG 24 hr tablet Take 1 tablet (5 mg total) by mouth at bedtime. 10/21/22  Yes Tysinger, Camelia Eng, PA-C  phenazopyridine (PYRIDIUM) 100 MG tablet Take 1 tablet (100 mg  total) by mouth 3 (three) times daily as needed for pain. 11/12/22 11/12/23 Yes Tysinger, Camelia Eng, PA-C  polyethylene glycol powder (GLYCOLAX/MIRALAX) 17 GM/SCOOP powder Take 17 g by mouth daily.   Yes [provider]  rosuvastatin (CRESTOR) 20 MG tablet Take 1 tablet (20 mg total) by mouth daily. 02/24/22  Yes Tysinger, Camelia Eng, PA-C  trimethoprim (TRIMPEX) 100 MG tablet Take 100 mg by mouth daily. 03/05/22  Yes [provider]  vitamin B-6 (PYRIDOXINE) 25 MG tablet Take 1 tablet (25 mg total) by mouth daily. 04/20/21  Yes Tysinger, Camelia Eng, PA-C    Family History Family History  Problem Relation Age of Onset   Arthritis Mother    Heart failure Mother  Mental illness Father    Kidney disease Father    Stroke Father    Heart attack Father    Macular degeneration Father    Heart disease Brother    Prostate cancer Brother    Stroke Paternal Grandmother    Stroke Maternal Aunt    Cancer Maternal Uncle     Social History Social History   Tobacco Use   Smoking status: Former    Packs/day: 0.50    Types: Cigarettes    Quit date: 05/22/2012    Years since quitting: 10.6   Smokeless tobacco: Never  Vaping Use   Vaping Use: Never used  Substance Use Topics   Alcohol use: No    Alcohol/week: 0.0 standard drinks of alcohol   Drug use: No     Allergies   Farxiga [dapagliflozin]   Review of Systems Review of Systems  Genitourinary:  Positive for frequency.  Per HPI   Physical Exam Triage Vital Signs ED Triage Vitals  Enc Vitals Group     BP 01/02/23 1110 113/71     Pulse Rate 01/02/23 1110 73     Resp 01/02/23 1110 16     Temp 01/02/23 1110 98.2 F (36.8 C)     Temp Source 01/02/23 1110 Oral     SpO2 01/02/23 1110 95 %     Weight 01/02/23 1109 165 lb (74.8 kg)     Height 01/02/23 1109 '5\' 2"'$  (1.575 m)     Head Circumference --      Peak Flow --      Pain Score 01/02/23 1109 8     Pain Loc --      Pain Edu? --      Excl. in [place]? --    No data  found.  Updated Vital Signs BP 113/71 (BP Location: Left Arm)   Pulse 73   Temp 98.2 F (36.8 C) (Oral)   Resp 16   Ht '5\' 2"'$  (1.575 m)   Wt 165 lb (74.8 kg)   SpO2 95%   BMI 30.18 kg/m   Visual Acuity Right Eye Distance:   Left Eye Distance:   Bilateral Distance:    Right Eye Near:   Left Eye Near:    Bilateral Near:     Physical Exam Vitals and nursing note reviewed.  Constitutional:      Appearance: She is not ill-appearing or toxic-appearing.  HENT:     Head: Normocephalic and atraumatic.     Right Ear: Hearing and external ear normal.     Left Ear: Hearing and external ear normal.     Nose: Nose normal.     Mouth/Throat:     Lips: Pink.     Mouth: Mucous membranes are moist.     Pharynx: No posterior oropharyngeal erythema.  Eyes:     General: Lids are normal. Vision grossly intact. Gaze aligned appropriately.     Extraocular Movements: Extraocular movements intact.     Conjunctiva/sclera: Conjunctivae normal.  Cardiovascular:     Rate and Rhythm: Normal rate and regular rhythm.     Heart sounds: Normal heart sounds, S1 normal and S2 normal.  Pulmonary:     Effort: Pulmonary effort is normal. No respiratory distress.     Breath sounds: Normal breath sounds and air entry.  Abdominal:     General: Bowel sounds are normal.     Palpations: Abdomen is soft.     Tenderness: There is no abdominal tenderness. There is no right CVA tenderness,  left CVA tenderness or guarding.  Musculoskeletal:     Cervical back: Neck supple.  Skin:    General: Skin is warm and dry.     Capillary Refill: Capillary refill takes less than 2 seconds.     Findings: No rash.  Neurological:     General: No focal deficit present.     Mental Status: She is alert and oriented to person, place, and time. Mental status is at baseline.     Cranial Nerves: No dysarthria or facial asymmetry.     Comments: Neurologically intact to her baseline.  Psychiatric:        Mood and Affect: Mood  normal.        Speech: Speech normal.        Behavior: Behavior normal.        Thought Content: Thought content normal.        Judgment: Judgment normal.      UC Treatments / Results  Labs (all labs ordered are listed, but only abnormal results are displayed) Labs Reviewed  POCT URINALYSIS DIPSTICK, ED / UC - Abnormal; Notable for the following components:      Result Value   Ketones, ur TRACE (*)    Hgb urine dipstick SMALL (*)    Protein, ur 30 (*)    Nitrite POSITIVE (*)    Leukocytes,Ua LARGE (*)    All other components within normal limits    EKG   Radiology No results found.  Procedures Procedures (including critical care time)  Medications Ordered in UC Medications - No data to display  Initial Impression / Assessment and Plan / UC Course  I have reviewed the triage vital signs and the nursing notes.  Pertinent labs & imaging results that were available during my care of the patient were reviewed by me and considered in my medical decision making (see chart for details).   1. Chronic urinary tract infection, dysuria Urinalysis is nitrite positive with large leukocytes, proteinuria, small hemoglobin, and trace ketones indicating likely acute uncomplicated UTI exacerbation of chronic urinary tract infection. Previous labs reviewed and show stable kidney function, therefore will treat with keflex twice daily for 7 days. Urine culture is pending, will change antibiotic based on culture as necessary. Advised to push fluids to stay well hydrated. She is not exhibiting any systemic signs or symptoms of urinary tract infection. Vital signs are hemodynamically stable. She is neurologically intact to her baseline. Has plans to follow-up with PCP January 10, 2023, encouraged to attend this appointment for ongoing management of chronic UTI.   Patient left urgent care prior to receiving after visit summary.  Discussed physical exam and available lab work findings in clinic with  patient.  Counseled patient regarding appropriate use of medications and potential side effects for all medications recommended or prescribed today. Discussed red flag signs and symptoms of worsening condition,when to call the PCP office, return to urgent care, and when to seek higher level of care in the emergency department. Patient verbalizes understanding and agreement with plan. All questions answered. Patient discharged in stable condition.   Final Clinical Impressions(s) / UC Diagnoses   Final diagnoses:  Chronic urinary tract infection  Dysuria   Discharge Instructions   None    ED Prescriptions     Medication Sig Dispense Auth. Provider   cephALEXin (KEFLEX) 500 MG capsule Take 1 capsule (500 mg total) by mouth 2 (two) times daily for 7 days. 14 capsule Talbot Grumbling, FNP  PDMP not reviewed this encounter.   Talbot Grumbling, [place] 01/08/23 2148

## 2023-01-10 ENCOUNTER — Ambulatory Visit (INDEPENDENT_AMBULATORY_CARE_PROVIDER_SITE_OTHER): Payer: Medicare HMO | Admitting: Medical

## 2023-01-10 VITALS — BP 120/70 | HR 73 | Temp 97.8°F | Wt 160.8 lb

## 2023-01-10 DIAGNOSIS — Z87440 Personal history of urinary (tract) infections: Secondary | ICD-10-CM

## 2023-01-10 DIAGNOSIS — R300 Dysuria: Secondary | ICD-10-CM | POA: Diagnosis not present

## 2023-01-10 DIAGNOSIS — E785 Hyperlipidemia, unspecified: Secondary | ICD-10-CM | POA: Diagnosis not present

## 2023-01-10 DIAGNOSIS — K5909 Other constipation: Secondary | ICD-10-CM

## 2023-01-10 DIAGNOSIS — I739 Peripheral vascular disease, unspecified: Secondary | ICD-10-CM

## 2023-01-10 DIAGNOSIS — E1169 Type 2 diabetes mellitus with other specified complication: Secondary | ICD-10-CM | POA: Diagnosis not present

## 2023-01-10 DIAGNOSIS — Z794 Long term (current) use of insulin: Secondary | ICD-10-CM

## 2023-01-10 LAB — POCT URINALYSIS DIP (PROADVANTAGE DEVICE)
Blood, UA: NEGATIVE
Glucose, UA: NEGATIVE mg/dL
Ketones, POC UA: NEGATIVE mg/dL
Leukocytes, UA: NEGATIVE
Nitrite, UA: NEGATIVE
Protein Ur, POC: NEGATIVE mg/dL
Specific Gravity, Urine: 1.03
Urobilinogen, Ur: NEGATIVE
pH, UA: 6 (ref 5.0–8.0)

## 2023-01-10 MED ORDER — ONETOUCH VERIO W/DEVICE KIT: PACK | 0 refills | Status: DC

## 2023-01-10 MED ORDER — ONETOUCH DELICA LANCETS 33G MISC: 1.0000 | Freq: Two times a day (BID) | 2 refills | Status: DC

## 2023-01-10 MED ORDER — HYDROCODONE-ACETAMINOPHEN 5-325 MG PO TABS: 1.0000 | ORAL_TABLET | Freq: Four times a day (QID) | ORAL | 0 refills | Status: DC | PRN

## 2023-01-10 NOTE — Progress Notes (Signed)
Subjective:  Marissa Oliver is a 82 y.o. female who presents for Chief Complaint  Patient presents with   med check    Med check- would like Urine check. Having issues with urinary and kidney infections. Just finished antibiotic on Saturday. Wants to make sure her infection is gone Would like A1c checked. Last checked on 10/18/22 8.9     Here for diabetes med check.  Diabetes-compliant with Levemir 40 units daily and metformin 500 mg twice daily.  Fasting blood sugars range anywhere from 105-160.  No foot concerns.  She feels like she may need a new glucometer as her old glucometer has quite a few years on it  Hyperlipidemia-compliant with Crestor 20 mg daily, aspirin 81 mg daily  Compliant with lisinopril 5 mg daily for renal protection  Having some constipation, sometimes Linzess helps sometimes not.  Her main concern is ongoing discomfort in the urethra/bladder area.  She has a history of recurrent UTI on prophylactic trimethoprim already.  She sees urology, Dr. Bjorn Oliver.  She sees him again soon but is frustrated as nothing seems to improve her current symptoms which mainly include pain and discomfort.  No current blood in the urine.  She did see the emergency department recently for similar urinary tract symptoms and was put on Keflex short-term for infection.  She uses hormonal cream once or twice a week for atrophic vaginitis but that does not seem to help either.  No other aggravating or relieving factors.    No other c/o.  Past Medical History:  Diagnosis Date   Atrophic vaginitis    estrace cream rx'd by Dr. Karsten Oliver   Colon polyp    tubular adenoma (colonoscopy q5 yrs)   Diabetes mellitus 2005   Diverticulosis    Dyslipidemia    GAD (generalized anxiety disorder)    Hemorrhoids    Hyperlipidemia    OAB (overactive bladder)    Osteoporosis    Psoriasis    mild, sees [facility]   Current Outpatient Medications on File Prior to Visit  Medication Sig  Dispense Refill   aspirin EC 81 MG tablet Take 1 tablet (81 mg total) by mouth every other day. Swallow whole. 90 tablet 3   insulin detemir (LEVEMIR FLEXTOUCH) 100 UNIT/ML FlexPen Inject 40 Units into the skin daily. 15 mL 2   LINZESS 290 MCG CAPS capsule Take 1 capsule (290 mcg total) by mouth daily before breakfast. Take 1, Every morning before breakfast 30 capsule 3   lisinopril (ZESTRIL) 5 MG tablet TAKE ONE TABLET BY MOUTH DAILY 90 tablet 3   metFORMIN (GLUCOPHAGE) 500 MG tablet Take 1 tablet (500 mg total) by mouth 2 (two) times daily with a meal. 200 tablet 1   omeprazole (PRILOSEC) 40 MG capsule Take 1 capsule (40 mg total) by mouth every morning 90 capsule 3   oxybutynin (DITROPAN-XL) 5 MG 24 hr tablet Take 1 tablet (5 mg total) by mouth at bedtime. 90 tablet 0   phenazopyridine (PYRIDIUM) 100 MG tablet Take 1 tablet (100 mg total) by mouth 3 (three) times daily as needed for pain. 20 tablet 0   polyethylene glycol powder (GLYCOLAX/MIRALAX) 17 GM/SCOOP powder Take 17 g by mouth daily.     rosuvastatin (CRESTOR) 20 MG tablet Take 1 tablet (20 mg total) by mouth daily. 90 tablet 3   Fiber POWD Take 1 Dose by mouth as needed.     glucose blood (ONETOUCH ULTRA) test strip USE AS DIRECTED TWO TIMES A DAY TO  CHECK BLOOD SUGAR 100 strip 2   Insulin Pen Needle (B-D UF III MINI PEN NEEDLES) 31G X 5 MM MISC USE ONCE DAILY 100 each 5   OneTouch Delica Lancets 99991111 MISC 1 each by Does not apply route in the morning and at bedtime. Dx:E11.9 100 each 2   vitamin B-6 (PYRIDOXINE) 25 MG tablet Take 1 tablet (25 mg total) by mouth daily. 90 tablet 0   No current facility-administered medications on file prior to visit.     The following portions of the patient's history were reviewed and updated as appropriate: allergies, current medications, past family history, past medical history, past social history, past surgical history and problem list.  ROS Otherwise as in subjective above  Objective: BP  120/70   Pulse 73   Temp 97.8 F (36.6 C)   Wt 160 lb 12.8 oz (72.9 kg)   BMI 29.41 kg/m   General appearance: alert, no distress, well developed, well nourished Neck: supple, no lymphadenopathy, no thyromegaly, no masses Heart: RRR, normal S1, S2, no murmurs Lungs: CTA bilaterally, no wheezes, rhonchi, or rales Abdomen: +bs, soft, non tender, non distended, no masses, no hepatomegaly, no splenomegaly Back: nontender Pulses: 2+ radial pulses, 2+ pedal pulses, normal cap refill Ext: no edema Psych: Tearful at times today discussing her ongoing pain    Assessment: Encounter Diagnoses  Name Primary?   Type 2 diabetes mellitus with other specified complication, with long-term current use of insulin (HCC) Yes   Dysuria    Dyslipidemia associated with type 2 diabetes mellitus ([place])    PAD (peripheral artery disease) ([place])    History of recurrent UTIs    Chronic constipation      Plan: Diabetes-continue current medication Levemir and metformin.  Labs today.  Dysuria, history of recurrent UTI-follow-up with urology soon.  Urinalysis normal today.  Continue trimethoprim prophylactic antibiotic.  OAB-continue oxybutynin  Chronic constipation-currently on Linzess with fairly good control of bowels  Dyslipidemia-continue statin and aspirin daily  Atrophic vaginitis-continue hormone cream once to twice per week.  She gets this from another provider  Marissa Oliver was seen today for med check.  Diagnoses and all orders for this visit:  Type 2 diabetes mellitus with other specified complication, with long-term current use of insulin (HCC) -     Comprehensive metabolic panel -     Hemoglobin A1c  Dysuria -     POCT Urinalysis DIP (Proadvantage Device) -     CBC with Differential/Platelet  Dyslipidemia associated with type 2 diabetes mellitus (HCC)  PAD (peripheral artery disease) (HCC)  History of recurrent UTIs  Chronic constipation  Other orders -      HYDROcodone-acetaminophen (NORCO) 5-325 MG tablet; Take 1 tablet by mouth every 6 (six) hours as needed.    Follow up: pending labs

## 2023-01-11 ENCOUNTER — Other Ambulatory Visit: Payer: Self-pay | Admitting: Medical

## 2023-01-11 LAB — COMPREHENSIVE METABOLIC PANEL
ALT: 23 IU/L (ref 0–32)
AST: 17 IU/L (ref 0–40)
Albumin/Globulin Ratio: 2.1 (ref 1.2–2.2)
Albumin: 4.4 g/dL (ref 3.7–4.7)
Alkaline Phosphatase: 90 IU/L (ref 44–121)
BUN/Creatinine Ratio: 24 (ref 12–28)
BUN: 21 mg/dL (ref 8–27)
Bilirubin Total: 0.2 mg/dL (ref 0.0–1.2)
CO2: 19 mmol/L — ABNORMAL LOW (ref 20–29)
Calcium: 10.2 mg/dL (ref 8.7–10.3)
Chloride: 105 mmol/L (ref 96–106)
Creatinine, Ser: 0.87 mg/dL (ref 0.57–1.00)
Globulin, Total: 2.1 g/dL (ref 1.5–4.5)
Glucose: 146 mg/dL — ABNORMAL HIGH (ref 70–99)
Potassium: 4.3 mmol/L (ref 3.5–5.2)
Sodium: 138 mmol/L (ref 134–144)
Total Protein: 6.5 g/dL (ref 6.0–8.5)
eGFR: 66 mL/min/{1.73_m2} (ref >59)

## 2023-01-11 LAB — HEMOGLOBIN A1C
Est. average glucose Bld gHb Est-mCnc: 186 mg/dL
Hgb A1c MFr Bld: 8.1 % — ABNORMAL HIGH (ref 4.8–5.6)

## 2023-01-11 LAB — CBC WITH DIFFERENTIAL/PLATELET
Basophils Absolute: 0 10*3/uL (ref 0.0–0.2)
Basos: 0 %
EOS (ABSOLUTE): 0.1 10*3/uL (ref 0.0–0.4)
Eos: 1 %
Hematocrit: 37.1 % (ref 34.0–46.6)
Hemoglobin: 12.2 g/dL (ref 11.1–15.9)
Immature Grans (Abs): 0 10*3/uL (ref 0.0–0.1)
Immature Granulocytes: 0 %
Lymphocytes Absolute: 1.9 10*3/uL (ref 0.7–3.1)
Lymphs: 32 %
MCH: 29.4 pg (ref 26.6–33.0)
MCHC: 32.9 g/dL (ref 31.5–35.7)
MCV: 89 fL (ref 79–97)
Monocytes Absolute: 0.3 10*3/uL (ref 0.1–0.9)
Monocytes: 4 %
Neutrophils Absolute: 3.7 10*3/uL (ref 1.4–7.0)
Neutrophils: 63 %
Platelets: 204 10*3/uL (ref 150–450)
RBC: 4.15 x10E6/uL (ref 3.77–5.28)
RDW: 13.3 % (ref 11.7–15.4)
WBC: 6 10*3/uL (ref 3.4–10.8)

## 2023-01-11 MED ORDER — BD PEN NEEDLE MINI U/F 31G X 5 MM MISC: 5 refills | Status: DC

## 2023-01-11 MED ORDER — LEVEMIR FLEXTOUCH 100 UNIT/ML ~~LOC~~ SOPN: 50.0000 [IU] | PEN_INJECTOR | Freq: Every day | SUBCUTANEOUS | 5 refills | Status: DC

## 2023-01-11 MED ORDER — ONETOUCH VERIO W/DEVICE KIT: PACK | 0 refills | Status: DC

## 2023-01-11 MED ORDER — OXYBUTYNIN CHLORIDE ER 5 MG PO TB24: 5.0000 mg | ORAL_TABLET | Freq: Every day | ORAL | 0 refills | Status: DC

## 2023-01-11 MED ORDER — SITAGLIPTIN PHOS-METFORMIN HCL 50-1000 MG PO TABS: 1.0000 | ORAL_TABLET | Freq: Two times a day (BID) | ORAL | 2 refills | Status: DC

## 2023-01-11 MED ORDER — LINAGLIPTIN-METFORMIN HCL 2.5-850 MG PO TABS: 1.0000 | ORAL_TABLET | Freq: Two times a day (BID) | ORAL | 1 refills | Status: DC

## 2023-01-11 NOTE — Progress Notes (Signed)
Results sent through MyChart

## 2023-01-12 ENCOUNTER — Other Ambulatory Visit: Payer: Self-pay | Admitting: Medical

## 2023-01-12 DIAGNOSIS — E118 Type 2 diabetes mellitus with unspecified complications: Secondary | ICD-10-CM

## 2023-01-14 NOTE — Telephone Encounter (Signed)
error 

## 2023-01-16 ENCOUNTER — Encounter (HOSPITAL_COMMUNITY): Payer: Self-pay

## 2023-01-16 ENCOUNTER — Other Ambulatory Visit: Payer: Self-pay | Admitting: Medical

## 2023-01-16 ENCOUNTER — Ambulatory Visit (HOSPITAL_COMMUNITY)
Admission: RE | Admit: 2023-01-16 | Discharge: 2023-01-16 | Disposition: A | Discharge status: Home / Self Care | Payer: Medicare HMO | Source: Ambulatory Visit | Attending: Emergency Medicine | Admitting: Emergency Medicine

## 2023-01-16 VITALS — BP 109/69 | HR 66 | Temp 97.6°F | Resp 17

## 2023-01-16 DIAGNOSIS — N390 Urinary tract infection, site not specified: Secondary | ICD-10-CM | POA: Diagnosis not present

## 2023-01-16 DIAGNOSIS — E1169 Type 2 diabetes mellitus with other specified complication: Secondary | ICD-10-CM

## 2023-01-16 LAB — POCT URINALYSIS DIPSTICK, ED / UC
Bilirubin Urine: NEGATIVE
Glucose, UA: NEGATIVE mg/dL
Ketones, ur: NEGATIVE mg/dL
Nitrite: POSITIVE — AB
Protein, ur: NEGATIVE mg/dL
Specific Gravity, Urine: 1.02 (ref 1.005–1.030)
Urobilinogen, UA: 0.2 mg/dL (ref 0.0–1.0)
pH: 5.5 (ref 5.0–8.0)

## 2023-01-16 MED ORDER — NITROFURANTOIN MONOHYD MACRO 100 MG PO CAPS: 100.0000 mg | ORAL_CAPSULE | Freq: Two times a day (BID) | ORAL | 0 refills | Status: DC

## 2023-01-16 NOTE — ED Triage Notes (Signed)
Patient presenting with burning urination, frequency and discoloration.

## 2023-01-16 NOTE — ED Triage Notes (Signed)
Patient had UTI 2 weeks ago. States still having frequency and discolored urine.

## 2023-01-16 NOTE — Discharge Instructions (Addendum)
Drink plenty of fluid, take antibiotic as directed. Follow up with urologist. Avoid tub baths, make sure to wipe from front to back.

## 2023-01-16 NOTE — ED Provider Notes (Signed)
[place]    CSN: [id number] Arrival date & time: 01/16/23  1108      History   Chief Complaint Chief Complaint  Patient presents with   Urinary Frequency    I was in there on 3rd with uti took meds went to pa follow upurine clear I have pressure and burning want make sure no recurrence - Entered by patient    HPI Marissa Oliver is a 82 y.o. female.   Marissa Oliver, 82 year old female, presents to urgent care with chief complaint of burning urination frequency and discoloration of urine.  Patient states she took Keflex recently here for recheck of urine.  Patient states she has appointment with her urologist April 11 for recheck.  Patient states she takes Bactrim daily to try to avoid UTIs.  The history is provided by the patient. No language interpreter was used.    Past Medical History:  Diagnosis Date   Atrophic vaginitis    estrace cream rx'd by Dr. Karsten Ro   Colon polyp    tubular adenoma (colonoscopy q5 yrs)   Diabetes mellitus 2005   Diverticulosis    Dyslipidemia    GAD (generalized anxiety disorder)    Hemorrhoids    Hyperlipidemia    OAB (overactive bladder)    Osteoporosis    Psoriasis    mild, sees [facility]    Patient Active Problem List   Diagnosis Date Noted   Acute UTI 01/16/2023   Chronic constipation 01/10/2023   Groin pain, right 02/24/2022   Pain of right hip 02/24/2022   Pain due to onychomycosis of toenails of both feet 10/05/2021   Enlarged and hypertrophic nails 07/20/2021   Needs flu shot 07/20/2021   Advanced directives, counseling/discussion 04/15/2021   PAD (peripheral artery disease) ([place]) 04/15/2021   Encounter for health maintenance examination in adult 04/15/2021   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   Chronic idiopathic constipation 12/17/2020   Diverticulosis of colon 12/17/2020   Obesity 12/17/2020   Need for pneumococcal vaccination 12/02/2020   Hearing loss 12/02/2020   Estrogen deficiency 12/02/2020    Post-menopausal 12/02/2020   Encounter for screening for vascular disease 12/02/2020   Screening for heart disease 12/02/2020   History of recurrent UTIs 11/13/2020   Dyslipidemia associated with type 2 diabetes mellitus ([place]) 07/09/2020   Senile purpura ([place]) 04/07/2020   Insomnia 04/07/2020   Psoriasis 04/07/2020   Decreased pulses in feet 12/19/2018   Former smoker 12/09/2016   History of colonic polyps 04/29/2016   Medicare annual wellness visit, subsequent 04/29/2016   Screening for cancer 04/29/2016   Vaccine counseling 04/29/2016   Type 2 diabetes mellitus with other specified complication ([place]) [id number]   Need for influenza vaccination 08/04/2015   Vitamin D deficiency 10/15/2014   Atrophic vaginitis 01/29/2014   Urinary incontinence 01/29/2014   Generalized anxiety disorder 01/29/2014   Overactive bladder 01/29/2014   Osteoporosis 08/12/2011    Past Surgical History:  Procedure Laterality Date   ABDOMINAL HYSTERECTOMY  1972   partial   APPENDECTOMY     CARDIOVASCULAR STRESS TEST  12/25/2004   EF 65%. OVERALL NORMAL STRESS CARDIOLITE. NO EVIDENCE  OF ISCHEMIA   COLONOSCOPY  08/2010   cologuard 01/2016 normal   PUBOVAGINAL SLING  04/1998   TUBAL LIGATION      OB History     Gravida  3   Para  3   Term      Preterm      AB  Living  3      SAB      IAB      Ectopic      Multiple      Live Births               Home Medications    Prior to Admission medications   Medication Sig Start Date End Date Taking? Authorizing Provider  nitrofurantoin, macrocrystal-monohydrate, (MACROBID) 100 MG capsule Take 1 capsule (100 mg total) by mouth 2 (two) times daily. 0000000  Yes Cleatus Goodin, Jeanett Schlein, NP  sitaGLIPtin-metformin (JANUMET) 50-1000 MG tablet Take 1 tablet by mouth 2 (two) times daily with a meal. 01/11/23   Tysinger, Camelia Eng, PA-C  aspirin EC 81 MG tablet Take 1 tablet (81 mg total) by mouth every other day. Swallow whole. 07/20/21    Tysinger, Camelia Eng, PA-C  Blood Glucose Monitoring Suppl [facility] VERIO) w/Device KIT Test twice a day 01/11/23   Tysinger, Camelia Eng, PA-C  Fiber POWD Take 1 Dose by mouth as needed.    [provider]  glucose blood (ONETOUCH ULTRA) test strip USE AS DIRECTED TWO TIMES A DAY TO CHECK BLOOD SUGAR 08/30/22   Tysinger, Camelia Eng, PA-C  HYDROcodone-acetaminophen (NORCO) 5-325 MG tablet Take 1 tablet by mouth every 6 (six) hours as needed. 01/10/23   Tysinger, Camelia Eng, PA-C  insulin detemir (LEVEMIR FLEXTOUCH) 100 UNIT/ML FlexPen Inject 50 Units into the skin daily. 01/11/23   Tysinger, Camelia Eng, PA-C  Insulin Pen Needle (B-D UF III MINI PEN NEEDLES) 31G X 5 MM MISC USE ONCE DAILY 01/11/23   Tysinger, Camelia Eng, PA-C  LINZESS 290 MCG CAPS capsule Take 1 capsule (290 mcg total) by mouth daily before breakfast. Take 1, Every morning before breakfast 08/25/22   Willia Craze, NP  lisinopril (ZESTRIL) 5 MG tablet TAKE ONE TABLET BY MOUTH DAILY 01/12/23   Tysinger, Camelia Eng, PA-C  omeprazole (PRILOSEC) 40 MG capsule Take 1 capsule (40 mg total) by mouth every morning 09/14/22   Willia Craze, NP  OneTouch Delica Lancets [id number] MISC 1 each by Does not apply route in the morning and at bedtime. Dx:E11.9 01/10/23   Tysinger, Camelia Eng, PA-C  oxybutynin (DITROPAN-XL) 5 MG 24 hr tablet Take 1 tablet (5 mg total) by mouth at bedtime. 01/11/23   Tysinger, Camelia Eng, PA-C  phenazopyridine (PYRIDIUM) 100 MG tablet Take 1 tablet (100 mg total) by mouth 3 (three) times daily as needed for pain. 11/12/22 11/12/23  Tysinger, Camelia Eng, PA-C  polyethylene glycol powder (GLYCOLAX/MIRALAX) 17 GM/SCOOP powder Take 17 g by mouth daily.    [provider]  rosuvastatin (CRESTOR) 20 MG tablet Take 1 tablet (20 mg total) by mouth daily. 02/24/22   Tysinger, Camelia Eng, PA-C  vitamin B-6 (PYRIDOXINE) 25 MG tablet Take 1 tablet (25 mg total) by mouth daily. 04/20/21   Tysinger, Camelia Eng, PA-C    Family History Family History   Problem Relation Age of Onset   Arthritis Mother    Heart failure Mother    Mental illness Father    Kidney disease Father    Stroke Father    Heart attack Father    Macular degeneration Father    Heart disease Brother    Prostate cancer Brother    Stroke Paternal Grandmother    Stroke Maternal Aunt    Cancer Maternal Uncle     Social History Social History   Tobacco Use   Smoking status: Former    Packs/day: .5  Types: Cigarettes    Quit date: 05/22/2012    Years since quitting: 10.6   Smokeless tobacco: Never  Vaping Use   Vaping Use: Never used  Substance Use Topics   Alcohol use: No    Alcohol/week: 0.0 standard drinks of alcohol   Drug use: No     Allergies   Farxiga [dapagliflozin]   Review of Systems Review of Systems  Constitutional:  Negative for fever.  Gastrointestinal:  Negative for abdominal pain.  Genitourinary:  Positive for dysuria and frequency. Negative for flank pain.  All other systems reviewed and are negative.    Physical Exam Triage Vital Signs ED Triage Vitals [01/16/23 1130]  Enc Vitals Group     BP      Pulse      Resp      Temp      Temp src      SpO2      Weight      Height      Head Circumference      Peak Flow      Pain Score 8     Pain Loc      Pain Edu?      Excl. in [place]?    No data found.  Updated Vital Signs BP 109/69 (BP Location: Right Arm)   Pulse 66   Temp 97.6 F (36.4 C) (Oral)   Resp 17   SpO2 97%   Visual Acuity Right Eye Distance:   Left Eye Distance:   Bilateral Distance:    Right Eye Near:   Left Eye Near:    Bilateral Near:     Physical Exam Vitals and nursing note reviewed.  Constitutional:      General: She is not in acute distress.    Appearance: She is well-developed and well-groomed.  HENT:     Head: Normocephalic and atraumatic.  Eyes:     Conjunctiva/sclera: Conjunctivae normal.  Cardiovascular:     Rate and Rhythm: Normal rate and regular rhythm.     Pulses: Normal  pulses.     Heart sounds: Normal heart sounds. No murmur heard. Pulmonary:     Effort: Pulmonary effort is normal. No respiratory distress.     Breath sounds: Normal breath sounds and air entry.  Abdominal:     Palpations: Abdomen is soft.     Tenderness: There is no abdominal tenderness.  Musculoskeletal:        General: No swelling.     Cervical back: Neck supple.  Skin:    General: Skin is warm and dry.     Capillary Refill: Capillary refill takes less than 2 seconds.  Neurological:     General: No focal deficit present.     Mental Status: She is alert and oriented to person, place, and time.     GCS: GCS eye subscore is 4. GCS verbal subscore is 5. GCS motor subscore is 6.  Psychiatric:        Attention and Perception: Attention normal.        Mood and Affect: Mood normal.        Speech: Speech normal.        Behavior: Behavior normal. Behavior is cooperative.      UC Treatments / Results  Labs (all labs ordered are listed, but only abnormal results are displayed) Labs Reviewed  POCT URINALYSIS DIPSTICK, ED / UC - Abnormal; Notable for the following components:      Result Value   Hgb urine dipstick  SMALL (*)    Nitrite POSITIVE (*)    Leukocytes,Ua MODERATE (*)    All other components within normal limits  URINE CULTURE    EKG   Radiology No results found.  Procedures Procedures (including critical care time)  Medications Ordered in UC Medications - No data to display  Initial Impression / Assessment and Plan / UC Course  I have reviewed the triage vital signs and the nursing notes.  Pertinent labs & imaging results that were available during my care of the patient were reviewed by me and considered in my medical decision making (see chart for details).     Ddx: Acute UTI, cystitis, interstitial  cystitis, hormone imbalance Final Clinical Impressions(s) / UC Diagnoses   Final diagnoses:  Acute UTI     Discharge Instructions      Drink plenty  of fluid, take antibiotic as directed. Follow up with urologist. Avoid tub baths, make sure to wipe from front to back.      ED Prescriptions     Medication Sig Dispense Auth. Provider   nitrofurantoin, macrocrystal-monohydrate, (MACROBID) 100 MG capsule Take 1 capsule (100 mg total) by mouth 2 (two) times daily. 10 capsule Jaylene Schrom, Jeanett Schlein, NP      PDMP not reviewed this encounter.   Tori Milks, NP 0000000 1222

## 2023-01-18 LAB — URINE CULTURE: Culture: >=100000 — AB

## 2023-01-19 ENCOUNTER — Telehealth: Payer: Self-pay

## 2023-01-19 NOTE — Telephone Encounter (Signed)
Pt called and left message requesting to speak with someone regarding patient assistance application for insulin.

## 2023-01-23 ENCOUNTER — Ambulatory Visit: Payer: Medicare HMO

## 2023-01-23 DIAGNOSIS — N3001 Acute cystitis with hematuria: Secondary | ICD-10-CM | POA: Diagnosis not present

## 2023-01-24 ENCOUNTER — Telehealth: Payer: Self-pay | Admitting: Medical

## 2023-01-24 NOTE — Telephone Encounter (Signed)
PT ASSISTANCE LANTUS application refaxed, per Fidela Salisbury didn't receive the one she faxed

## 2023-01-31 ENCOUNTER — Telehealth: Payer: Self-pay | Admitting: Family Medicine

## 2023-01-31 NOTE — Telephone Encounter (Signed)
Rcd letter from Patient [employer] stating Lantus 100 units approved and will be ship directly to health care provider. Subsequent shipments every 90 days.  Eligible for assistance through 11/01/23.  Letter was sent out to pt through [employer].

## 2023-02-10 ENCOUNTER — Telehealth: Payer: Self-pay | Admitting: Internal Medicine

## 2023-02-10 DIAGNOSIS — Z794 Long term (current) use of insulin: Secondary | ICD-10-CM

## 2023-02-10 DIAGNOSIS — N3281 Overactive bladder: Secondary | ICD-10-CM | POA: Diagnosis not present

## 2023-02-10 DIAGNOSIS — N390 Urinary tract infection, site not specified: Secondary | ICD-10-CM | POA: Diagnosis not present

## 2023-02-10 DIAGNOSIS — R350 Frequency of micturition: Secondary | ICD-10-CM | POA: Diagnosis not present

## 2023-02-10 MED ORDER — BLOOD GLUCOSE TEST VI STRP: ORAL_STRIP | 1 refills | Status: DC

## 2023-02-10 NOTE — Telephone Encounter (Signed)
Refilled this 

## 2023-02-10 NOTE — Telephone Encounter (Signed)
-----   Message from Sherrill Raring, [facility] sent at 02/10/2023  2:14 PM EDT ----- Regarding: Med Refill Pt is requesting new rx for One Touch Verio Test Strips be sent to her preferred pharmacy:  [facility] [id number] [place], [place] - [address]  Phone: [phone] Fax: [phone]   Thank you, Sherrill Raring Clinical Pharmacist [phone]

## 2023-02-14 ENCOUNTER — Telehealth: Payer: Self-pay | Admitting: Medical

## 2023-02-14 NOTE — Telephone Encounter (Signed)
Pt called & asked that Pt assistance application for Linzess be mailed to her,  Marylene Land was working with pt on this.  I mailed this to patient.

## 2023-02-18 ENCOUNTER — Telehealth: Payer: Self-pay | Admitting: Internal Medicine

## 2023-02-18 DIAGNOSIS — Z79899 Other long term (current) drug therapy: Secondary | ICD-10-CM

## 2023-02-18 DIAGNOSIS — Z794 Long term (current) use of insulin: Secondary | ICD-10-CM

## 2023-02-18 NOTE — Telephone Encounter (Signed)
done

## 2023-02-18 NOTE — Telephone Encounter (Signed)
-----   Message from Sherrill Raring, [facility] sent at 02/17/2023  2:32 PM EDT ----- Regarding: 2300 - Care Coordation Pharmacy Referral Hello,  Pt is needing a 2300-Care Coordination Pharmacy Referral placed for diabetes management/med adherence, per PCP/Diana.  Thank you,  Sherrill Raring Clinical Pharmacist [phone]

## 2023-02-24 DIAGNOSIS — N390 Urinary tract infection, site not specified: Secondary | ICD-10-CM | POA: Diagnosis not present

## 2023-02-28 ENCOUNTER — Other Ambulatory Visit: Payer: Self-pay | Admitting: Medical

## 2023-02-28 MED ORDER — HYDROCODONE-ACETAMINOPHEN 5-325 MG PO TABS: 1.0000 | ORAL_TABLET | Freq: Four times a day (QID) | ORAL | 0 refills | Status: DC | PRN

## 2023-03-01 ENCOUNTER — Telehealth: Payer: Self-pay | Admitting: Internal Medicine

## 2023-03-01 DIAGNOSIS — E1169 Type 2 diabetes mellitus with other specified complication: Secondary | ICD-10-CM

## 2023-03-01 DIAGNOSIS — Z79899 Other long term (current) drug therapy: Secondary | ICD-10-CM

## 2023-03-01 NOTE — Telephone Encounter (Signed)
-----   Message from Sherrill Raring, [facility] sent at 03/01/2023  7:58 AM EDT ----- Regarding: FW: 2300 - Care Coordation Pharmacy Referral Hi Tyronne Blann!  I was notified that the referral below was placed but the referral reason selected was "Care Coordination ACO" when it needs to be "Pharmacy."  When you have a moment, could you please place the order this way so my team can schedule? Sorry for the confusion!  Thank you again, Sherrill Raring Clinical Pharmacist [phone] ----- Message ----- From: Sherrill Raring, [facility] Sent: 02/17/2023   2:33 PM EDT To: Britt Boozer, CMA Subject: [id number] - Care Coordation Pharmacy Referral       Hello,  Pt is needing a 2300-Care Coordination Pharmacy Referral placed for diabetes management/med adherence, per PCP/Diana.  Thank you,  Sherrill Raring Clinical Pharmacist [phone]

## 2023-03-01 NOTE — Telephone Encounter (Signed)
done

## 2023-03-02 HISTORY — PX: CYSTOSCOPY: SUR368

## 2023-03-07 ENCOUNTER — Telehealth: Payer: Self-pay

## 2023-03-07 NOTE — Progress Notes (Unsigned)
Patient ID: Marissa Oliver, female   DOB: 04-Dec-1940, 82 y.o.   MRN: [id number]  Care Management & Coordination Services Pharmacy Team  Reason for Encounter:Chart Prep for initial encounter with Marissa Oliver Clinical Pharmacist on 03/10/23 at 10 via telephone.   {US HC Outreach:[id number]}  Have you seen any other providers since your last visit? **{YES NO:22349} Any changes in your medications or health? {YES NO:22349} Any side effects from any medications? {YES NO:22349} Do you have an symptoms or problems not managed by your medications? {YES NO:22349} Any concerns about your health right now? {YES NO:22349} Has your provider asked that you check blood pressure, blood sugar, or follow special diet at home? {YES NO:22349} Do you get any type of exercise on a regular basis? {YES NO:22349} Can you think of a goal you would like to reach for your health? *** Do you have any problems getting your medications? {YES NO:22349} Is there anything that you would like to discuss during the appointment? ***  Patient assistance for the following mediations  Patient aware to bring blood pressure cuff, medications that do not need refrigeration and supplements to appointment    Chart review:  Recent office visits:  01/10/23 Tysinger, Marissa Balo, PA-C - Patient presented for Type 2 diabetes mellitus with other specified complication with long term current use of insulin and other concerns. Prescribed Hydrocodone-Acetaminophen. Stopped Albuterol. Stopped Macrobid. Stopped Trimethoprim.  11/10/22 Tysinger, Marissa Balo, PA-C -  Patient presented for Burning with urination and other concerns. Prescribed Amoxicillin.   10/18/22 Tysinger, Marissa Balo, PA-C - Patient presented for Type 2 diabetes mellitus with other specified complication with long term current use of insulin and other concerns. No medication changes.   Recent consult visits:  01/23/23 Marissa Box, FNP - Patient presented to [facility] for UTI.  Prescribed Cipro   11/18/22 Marissa Oliver - Claims encounter for Postmenopausal atrophic vaginitis and other concerns. No other visit details available.   09/10/22 Marissa Pel, NP Marissa Oliver) - Patient presented for Chronic idiopathic constipation and other concerns. No medication changes.    Hospital visits:  Medication Reconciliation was completed by comparing discharge summary, patient's EMR and Pharmacy list, and upon discussion with patient.  Patient presented to [facility] at [facility] on 01/16/23 for Acute UTI Patient was present for 32 min.  New?Medications Started at [facility] Discharge:?? -started  [facility]  Medication Changes at Hospital Discharge: -Changed  none  Medications Discontinued at Hospital Discharge: -Stopped  none  Medications that remain the same after Hospital Discharge:??  -All other medications will remain the same.    Medication Reconciliation was completed by comparing discharge summary, patient's EMR and Pharmacy list, and upon discussion with patient.  Patient presented to [facility] at [facility] on 01/02/23 for Chronic UTI Patient was present for 1 hour.  New?Medications Started at [facility] Discharge:?? -started  Cephalexin  Medication Changes at Hospital Discharge: -Changed  none  Medications Discontinued at Hospital Discharge: -Stopped  none  Medications that remain the same after Hospital Discharge:??  -All other medications will remain the same.        Fill History : ONE TCH FLEX VERIO KIT 01/14/2023 30   CEPHALEXIN 500MG  CAP 01/27/2023 7   CIPROFLOXACN 500MG  TAB 01/23/2023 10   FARXIGA      TAB 45M 06/18/2020 90   ONE TOUCH ULTRA TES 01/17/2023 50   ONE TOUCH ULTRA TES 01/17/2023 50   HYDROCO/APAP 5-325MG  TAB 01/10/2023 7  BD PEN NEEDL 31GX5MM MIS 01/12/2023 100   ONE TCH 33G DELICAPL LNC 01/10/2023 50   LINZESS CAP 01/16/2023 30   LISINOPRIL 5MG  TAB 01/12/2023 90   MELOXICAM 7.5MG  TAB  05/31/2022 30   METFORMIN 500MG  TAB 01/12/2023 100   NITROFUR MAC 100MG  CAP 02/10/2023 30   OMEPRAZOLE 40MG  CAP 09/14/2022 90   OXYBUTYNIN 5MG  ER TAB 01/11/2023 90   ROSUVASTATIN 20MG  TAB 02/05/2023 90   LEVEMIR FLEX 100U/ML PEN 10/14/2021 30     Star Rating Drugs:  Lisinopril 5 mg - Last filled 01/13/23 90 DS at [facility] Rosuvastatin 20 mg - Last filled 02/05/23 90 DS at [facility] Gaps: COVID Booster - Overdue AWV - 05/16/22   Pamala Duffel CMA Clinical Pharmacist Assistant [phone]

## 2023-03-08 ENCOUNTER — Telehealth: Payer: Self-pay | Admitting: Medical

## 2023-03-08 NOTE — Telephone Encounter (Signed)
PT ASSISTANCE LINZESS application faxed

## 2023-03-09 NOTE — Progress Notes (Unsigned)
Care Management & Coordination Services Pharmacy Note  03/10/2023 Name:  Marissa Oliver MRN:  [id number] DOB:  19-Nov-1940  Summary A1C not at goal <7.5 LDL at goal <70 Due for updated dexa scan - pt declines  Recommendations/Changes made from today's visit: -Counseled to check BG BID, once fasting and one at least 2 hours post-prandial to better assess glycemic control at home -Updated pt med list to reflect current med regimen - Metformin 500mg  1 with supper - has some on hand and is taking, still taking levemir but will pick up Lantus PAP soon -Counseled on cholesterol-mindful/low carb diet -Future consideration - if  A1C still elevated at next PCP appt, increase Metformin to BID  Follow up plan: DM call in July and Sept Pharmacist visit in October   Subjective: Marissa Oliver is an 82 y.o. year old female who is a primary patient of Jac Canavan, PA-C.  The care coordination team was consulted for assistance with disease management and care coordination needs.    Engaged with patient by telephone for initial visit.  Recent office visits: 01/10/23 Tysinger, Kermit Balo, PA-C - Patient presented for Type 2 diabetes mellitus with other specified complication with long term current use of insulin and other concerns. Prescribed Hydrocodone-Acetaminophen. Stopped Albuterol. Stopped Macrobid. Stopped Trimethoprim.   11/10/22 Tysinger, Kermit Balo, PA-C -  Patient presented for Burning with urination and other concerns. Prescribed Amoxicillin.    10/18/22 Tysinger, Kermit Balo, PA-C - Patient presented for Type 2 diabetes mellitus with other specified complication with long term current use of insulin and other concerns. No medication changes.   Recent consult visits: 01/23/23 Lestine Box, FNP - Patient presented to [facility] for UTI. Prescribed Cipro    11/18/22 Bartholomew Crews L - Claims encounter for Postmenopausal atrophic vaginitis and other concerns. No other visit details  available.    09/10/22 Meredith Pel, NP Laurette Schimke) - Patient presented for Chronic idiopathic constipation and other concerns. No medication changes.   Hospital visits: 01/15/22 [facility] - For Acute UTI, LOS 32 min, START Macrobid   Objective:  Lab Results  Component Value Date   CREATININE 0.87 01/10/2023   BUN 21 01/10/2023   EGFR 66 01/10/2023   GFRNONAA 76 12/02/2020   GFRAA 88 12/02/2020   NA 138 01/10/2023   K 4.3 01/10/2023   CALCIUM 10.2 01/10/2023   CO2 19 (L) 01/10/2023   GLUCOSE 146 (H) 01/10/2023    Lab Results  Component Value Date/Time   HGBA1C 8.1 (H) 01/10/2023 12:03 PM   HGBA1C 8.9 (A) 10/18/2022 11:12 AM   HGBA1C 7.7 (A) 07/12/2022 10:34 AM   HGBA1C 7.6 (H) 02/03/2022 08:30 AM   HGBA1C 7.7 (A) 10/11/2019 10:46 AM   MICROALBUR <0.2 08/04/2015 12:01 AM   MICROALBUR 0.50 07/15/2014 11:20 AM    Last diabetic Eye exam:  Lab Results  Component Value Date/Time   HMDIABEYEEXA Retinopathy (A) 04/28/2022 12:00 AM    Last diabetic Foot exam: No results found for: "HMDIABFOOTEX"   Lab Results  Component Value Date   CHOL 134 07/12/2022   HDL 48 07/12/2022   LDLCALC 58 07/12/2022   TRIG 166 (H) 07/12/2022   CHOLHDL 2.8 07/12/2022       Latest Ref Rng & Units 01/10/2023   12:03 PM 07/12/2022   11:34 AM 04/16/2021    9:33 AM  Hepatic Function  Total Protein 6.0 - 8.5 g/dL 6.5  7.0  6.8   Albumin 3.7 -  4.7 g/dL 4.4  4.8  4.6   AST 0 - 40 IU/L 17  18  11    ALT 0 - 32 IU/L 23  16  16    Alk Phosphatase 44 - 121 IU/L 90  97  95   Total Bilirubin 0.0 - 1.2 mg/dL 0.2  0.2  0.3     Lab Results  Component Value Date/Time   TSH 1.930 12/19/2018 01:25 PM   TSH 1.415 09/07/2012 12:30 PM       Latest Ref Rng & Units 01/10/2023   12:03 PM 02/03/2022    8:30 AM 12/02/2020   11:38 AM  CBC  WBC 3.4 - 10.8 x10E3/uL 6.0  5.6  5.8   Hemoglobin 11.1 - 15.9 g/dL 16.1  09.6  04.5   Hematocrit 34.0 - 46.6 % 37.1  37.7  42.0   Platelets 150 - 450  x10E3/uL 204  181  154     Lab Results  Component Value Date/Time   VD25OH 45.2 04/07/2020 10:59 AM   VD25OH 43 09/28/2016 12:47 PM    Clinical ASCVD: No  The ASCVD Risk score (Arnett DK, et al., 2019) failed to calculate for the following reasons:   The 2019 ASCVD risk score is only valid for ages 65 to 83       07/12/2022   10:16 AM 04/16/2022   11:01 AM 02/02/2022   10:04 AM  Depression screen PHQ 2/9  Decreased Interest 0 1 0  Down, Depressed, Hopeless 0 1 0  PHQ - 2 Score 0 2 0  Altered sleeping  0   Tired, decreased energy  1   Change in appetite  0   Feeling bad or failure about yourself   0   Trouble concentrating  0   Moving slowly or fidgety/restless  0   Suicidal thoughts  0   PHQ-9 Score  3   Difficult doing work/chores  Somewhat difficult      Social History   Tobacco Use  Smoking Status Former   Packs/day: .5   Types: Cigarettes   Quit date: 05/22/2012   Years since quitting: 10.8  Smokeless Tobacco Never   BP Readings from Last 3 Encounters:  01/16/23 109/69  01/10/23 120/70  01/02/23 113/71   Pulse Readings from Last 3 Encounters:  01/16/23 66  01/10/23 73  01/02/23 73   Wt Readings from Last 3 Encounters:  01/10/23 160 lb 12.8 oz (72.9 kg)  01/02/23 165 lb (74.8 kg)  11/10/22 162 lb 12.8 oz (73.8 kg)   BMI Readings from Last 3 Encounters:  01/10/23 29.41 kg/m  01/02/23 30.18 kg/m  11/10/22 29.78 kg/m    Allergies  Allergen Reactions   Farxiga [Dapagliflozin]     Recurrent urinary tract infection    Medications Reviewed Today     Reviewed by Sherrill Raring, RPH (Pharmacist) on 03/10/23 at 1035  Med List Status: <None>   Medication Order Taking? Sig Documenting Provider Last Dose Status Informant  aspirin EC 81 MG tablet [id number] Yes Take 1 tablet (81 mg total) by mouth every other day. Swallow whole. Jac Canavan, PA-C Taking Active   Blood Glucose Monitoring Suppl [facility] VERIO) w/Device Andria Rhein [id number]  Test twice a  day Jac Canavan, PA-C  Active   cholecalciferol (VITAMIN D3) 25 MCG (1000 UNIT) tablet [id number] Yes Take 1,000 Units by mouth daily. [provider]  Active Self  Fiber POWD [id number] Yes Take 1 Dose by mouth as needed. [provider] Taking  Active   Glucose Blood (BLOOD GLUCOSE TEST STRIPS) STRP [id number]  Test 1-2 times a day Genia Del  Active   HYDROcodone-acetaminophen [facility]) 5-325 MG tablet [id number] Yes Take 1 tablet by mouth every 6 (six) hours as needed. Tysinger, Kermit Balo, PA-C Taking Active   insulin detemir (LEVEMIR FLEXTOUCH) 100 UNIT/ML FlexPen [id number]  Inject 50 Units into the skin daily. Tysinger, Kermit Balo, PA-C  Active            Med Note Sherrill Raring   Thu Mar 10, 2023 10:32 AM) 45 units daily  Insulin Pen Needle (B-D UF III MINI PEN NEEDLES) 31G X 5 MM MISC [id number]  USE ONCE DAILY Genia Del  Active   LINZESS 290 MCG CAPS capsule [id number] Yes Take 1 capsule (290 mcg total) by mouth daily before breakfast. Take 1, Every morning before breakfast Meredith Pel, NP Taking Active   lisinopril (ZESTRIL) 5 MG tablet [id number] Yes TAKE ONE TABLET BY MOUTH DAILY Jac Canavan, PA-C Taking Active   METFORMIN HCL ER PO [id number] Yes Take 500 mg by mouth daily. [provider]  Active Self  nitrofurantoin, macrocrystal-monohydrate, (MACROBID) 100 MG capsule [id number]  Take 1 capsule (100 mg total) by mouth 2 (two) times daily. Defelice, Para March, NP  Active   omeprazole (PRILOSEC) 40 MG capsule [id number] Yes Take 1 capsule (40 mg total) by mouth every morning Meredith Pel, NP Taking Active            Med Note Theodis Sato Mar 10, 2023 10:32 AM) Leanora Ivanoff prn  OneTouch Delica Lancets 33G MISC [id number]  1 each by Does not apply route in the morning and at bedtime. Dx:E11.9 Tysinger, Kermit Balo, PA-C  Active   oxybutynin (DITROPAN-XL) 5 MG 24 hr tablet [id number] Yes Take 1 tablet (5 mg total) by mouth at  bedtime. Tysinger, Kermit Balo, PA-C Taking Active   phenazopyridine (PYRIDIUM) 100 MG tablet [id number] Yes Take 1 tablet (100 mg total) by mouth 3 (three) times daily as needed for pain. Tysinger, Kermit Balo, PA-C Taking Active   polyethylene glycol powder (GLYCOLAX/MIRALAX) 17 GM/SCOOP powder [id number] Yes Take 17 g by mouth daily. [provider] Taking Active   rosuvastatin (CRESTOR) 20 MG tablet [id number] Yes Take 1 tablet (20 mg total) by mouth daily. Tysinger, Kermit Balo, PA-C Taking Active   vitamin B-6 (PYRIDOXINE) 25 MG tablet [id number] Yes Take 1 tablet (25 mg total) by mouth daily. Tysinger, Kermit Balo, PA-C Taking Active             SDOH:  (Social Determinants of Health) assessments and interventions performed: Yes SDOH Interventions    Flowsheet Row Care Coordination from 03/10/2023 in [facility] Clinical Support from 04/16/2022 in [facility]  SDOH Interventions    Food Insecurity Interventions Intervention Not Indicated --  Housing Interventions Intervention Not Indicated --  Depression Interventions/Treatment  -- GYI9-4 Score <4 Follow-up Not Indicated       Medication Assistance: Lantus approved through PAP, Linzess PAP pending  Medication Access: Within the past 30 days, how often has patient missed a dose of medication? None Is a pillbox or other method used to improve adherence? Yes  Factors that may affect medication adherence? financial need Are meds synced by current pharmacy? No  Are meds delivered by current pharmacy? No  Does patient experience delays in picking up medications due to transportation concerns? No   [facility] Reviewed: Is patient  disadvantaged to use [facility]?: No  Current Rx insurance plan: [employer] Name and location of Current pharmacy:  [id number] - [place], [place] - [address] AT [facility] [address] [place] [address] Phone: [phone] Fax:  [phone]  [id number] - [place], [place] - [address] [address] Phone: [phone] Fax: [phone]  [facility] services reviewed with patient today?: No  Patient requests to transfer care to [facility]?: No  Reason patient declined to change pharmacies: Disadvantaged due to insurance/mail order and Not mentioned at this visit  Compliance/Adherence/Medication fill history: Care Gaps: COVID Booster - Overdue AWV - 05/16/22  Star-Rating Drugs: Lisinopril 5 mg - Last filled 01/13/23 90 DS at [facility] Rosuvastatin 20 mg - Last filled 02/05/23 90 DS at [facility]   Assessment/Plan Hyperlipidemia: (LDL goal < 70) -Controlled -Current treatment: Rosuvastatin 20mg  1 qd Appropriate, Effective, Safe, Accessible -Medications previously tried: Zetia, Pravastatin -Current dietary patterns: not able to discuss in detail, patient was short for time -Current exercise habits: not discussed, see above -Educated on Cholesterol goals;  Benefits of statin for ASCVD risk reduction; Importance of limiting foods high in cholesterol; -Recommended to continue current medication  Diabetes (A1c goal <7.5%) -Uncontrolled -Current medications: Metformin 500mg  1 tablet at night Appropriate, Query Effective Levemir 45 units daily into the skin at night Appropriate, Effective, Safe, Accessible Lisinopril 5mg  1 qd Appropriate, Effective, Safe, Accessible -Medications previously tried: [place], Glipizide, Glyburide, Tresiba, Kombiglyze -Current home glucose readings fasting glucose: 70-130, does report 171 this morning but ate pasta salad right before bed post prandial glucose: not checking -Denies hypoglycemic/hyperglycemic symptoms -Current meal patterns:  Tries to be mindful of carbs but does slip up at night sometimes  Drinks: Water, unsweet tea with splenda, occasional regular soda -Current exercise: not discussed -Educated  on A1c and blood sugar goals; Complications of diabetes including kidney damage, retinal damage, and cardiovascular disease; Proper insulin injection technique; Prevention and management of hypoglycemic episodes; Benefits of routine self-monitoring of blood sugar; -Counseled to check feet daily and get yearly eye exams -Counseled on diet and exercise extensively Recommended to continue current medication Recommended checking sugars at varying times to get a better idea of glycemic control. Future consideration: increase metformin to 500mg  BID at next PCP appt if A1C still elevated   Osteoporosis / Osteopenia (Goal Prevent bone loss and bone fracture) -Not assessed today -Last DEXA Scan: 12/09/20   -Due for updated scan -  patient declines  Constipation/Diverticulosis (Goal: Regular bowel movements) -Not assessed today -Current treatment  Linzess 1 tab qd Appropriate, Effective, Safe, Accessible Miralax prn Appropriate, Effective, Safe, Accessible Fiber powder Appropriate, Effective, Safe, Accessible Omeprazole 40mg  1 qd prn Appropriate, Effective, Safe, Accessible  Overactive BladderHx of UTI (Goal: Prevent frequent urination and frequent urge to go) -Not assessed today -Current treatment  Oxybutynin XL 5mg  1 qhs Appropriate, Effective, Safe, Accessible Azo TID prn Appropriate, Effective, Safe, Accessible  OTC  -Current treatment Vitamin B6 25mg  1qd Appropriate, Effective, Safe, Accessible Aspirin 81mg  1 qd Appropriate, Effective, Safe, Accessible Vit D3 25 mcg 1 qd Appropriate, Effective, Safe, Accessible  Sherrill Raring Clinical Pharmacist [phone]

## 2023-03-10 ENCOUNTER — Ambulatory Visit: Payer: Medicare HMO

## 2023-03-17 NOTE — Telephone Encounter (Signed)
Pt has been updated already

## 2023-03-18 ENCOUNTER — Other Ambulatory Visit: Payer: Self-pay

## 2023-03-18 MED ORDER — METFORMIN HCL 500 MG PO TABS: 500.0000 mg | ORAL_TABLET | Freq: Two times a day (BID) | ORAL | 1 refills | Status: DC

## 2023-03-24 NOTE — Telephone Encounter (Signed)
Pt Assistance approved til 11/01/23, called pt & informed

## 2023-03-30 DIAGNOSIS — N3946 Mixed incontinence: Secondary | ICD-10-CM | POA: Diagnosis not present

## 2023-03-30 DIAGNOSIS — R350 Frequency of micturition: Secondary | ICD-10-CM | POA: Diagnosis not present

## 2023-04-05 ENCOUNTER — Ambulatory Visit (INDEPENDENT_AMBULATORY_CARE_PROVIDER_SITE_OTHER): Payer: Medicare HMO

## 2023-04-05 VITALS — Ht 62.0 in | Wt 165.0 lb

## 2023-04-05 DIAGNOSIS — Z0000 Encounter for general adult medical examination without abnormal findings: Secondary | ICD-10-CM | POA: Diagnosis not present

## 2023-04-05 NOTE — Progress Notes (Signed)
I connected with  Marissa Oliver on 04/05/23 by a audio enabled telemedicine application and verified that I am speaking with the correct person using two identifiers.  Patient Location: Home  Provider Location: Office/Clinic  I discussed the limitations of evaluation and management by telemedicine. The patient expressed understanding and agreed to proceed. Subjective:   Marissa Oliver is a 82 y.o. female who presents for Medicare Annual (Subsequent) preventive examination.  Review of Systems     Cardiac Risk Factors include: advanced age (>66men, >83 women);diabetes mellitus;dyslipidemia;obesity (BMI >30kg/m2)     Objective:    Today's Vitals   04/05/23 1457  Weight: 165 lb (74.8 kg)  Height: 5\' 2"  (1.575 m)   Body mass index is 30.18 kg/m.     04/05/2023    3:03 PM 04/16/2022   11:00 AM 04/07/2020   10:02 AM  Advanced Directives  Does Patient Have a Medical Advance Directive? Yes Yes Yes  Type of Estate agent of [place];Living will Healthcare Power of [place];Living will Living will;Healthcare Power of Attorney  Does patient want to make changes to medical advance directive? No - Patient declined    Copy of Healthcare Power of Attorney in Chart? Yes - validated most recent copy scanned in chart (See row information) Yes - validated most recent copy scanned in chart (See row information) Yes - validated most recent copy scanned in chart (See row information)    Current Medications (verified) Outpatient Encounter Medications as of 04/05/2023  Medication Sig   aspirin EC 81 MG tablet Take 1 tablet (81 mg total) by mouth every other day. Swallow whole.   Blood Glucose Monitoring Suppl (ONETOUCH VERIO) w/Device KIT Test twice a day   cholecalciferol (VITAMIN D3) 25 MCG (1000 UNIT) tablet Take 1,000 Units by mouth daily.   Fiber POWD Take 1 Dose by mouth as needed.   Glucose Blood (BLOOD GLUCOSE TEST STRIPS) STRP Test 1-2 times a day    HYDROcodone-acetaminophen (NORCO) 5-325 MG tablet Take 1 tablet by mouth every 6 (six) hours as needed.   insulin detemir (LEVEMIR FLEXTOUCH) 100 UNIT/ML FlexPen Inject 50 Units into the skin daily.   Insulin Pen Needle (B-D UF III MINI PEN NEEDLES) 31G X 5 MM MISC USE ONCE DAILY   LINZESS 290 MCG CAPS capsule Take 1 capsule (290 mcg total) by mouth daily before breakfast. Take 1, Every morning before breakfast   lisinopril (ZESTRIL) 5 MG tablet TAKE ONE TABLET BY MOUTH DAILY   METFORMIN HCL ER PO Take 500 mg by mouth daily.   nitrofurantoin, macrocrystal-monohydrate, (MACROBID) 100 MG capsule Take 1 capsule (100 mg total) by mouth 2 (two) times daily.   omeprazole (PRILOSEC) 40 MG capsule Take 1 capsule (40 mg total) by mouth every morning   OneTouch Delica Lancets 33G MISC 1 each by Does not apply route in the morning and at bedtime. Dx:E11.9   polyethylene glycol powder (GLYCOLAX/MIRALAX) 17 GM/SCOOP powder Take 17 g by mouth daily.   rosuvastatin (CRESTOR) 20 MG tablet Take 1 tablet (20 mg total) by mouth daily.   vitamin B-6 (PYRIDOXINE) 25 MG tablet Take 1 tablet (25 mg total) by mouth daily.   metFORMIN (GLUCOPHAGE) 500 MG tablet Take 1 tablet (500 mg total) by mouth 2 (two) times daily with a meal. (Patient not taking: Reported on 04/05/2023)   oxybutynin (DITROPAN-XL) 5 MG 24 hr tablet Take 1 tablet (5 mg total) by mouth at bedtime. (Patient not taking: Reported on 04/05/2023)   phenazopyridine (PYRIDIUM)  100 MG tablet Take 1 tablet (100 mg total) by mouth 3 (three) times daily as needed for pain. (Patient not taking: Reported on 04/05/2023)   No facility-administered encounter medications on file as of 04/05/2023.    Allergies (verified) Farxiga [dapagliflozin]   History: Past Medical History:  Diagnosis Date   Atrophic vaginitis    estrace cream rx'd by Dr. Vernie Ammons   Colon polyp    tubular adenoma (colonoscopy q5 yrs)   Diabetes mellitus 2005   Diverticulosis    Dyslipidemia    GAD  (generalized anxiety disorder)    Hemorrhoids    Hyperlipidemia    OAB (overactive bladder)    Osteoporosis    Psoriasis    mild, sees [facility]   Past Surgical History:  Procedure Laterality Date   ABDOMINAL HYSTERECTOMY  1972   partial   APPENDECTOMY     CARDIOVASCULAR STRESS TEST  12/25/2004   EF 65%. OVERALL NORMAL STRESS CARDIOLITE. NO EVIDENCE  OF ISCHEMIA   COLONOSCOPY  08/2010   cologuard 01/2016 normal   PUBOVAGINAL SLING  04/1998   TUBAL LIGATION     Family History  Problem Relation Age of Onset   Arthritis Mother    Heart failure Mother    Mental illness Father    Kidney disease Father    Stroke Father    Heart attack Father    Macular degeneration Father    Heart disease Brother    Prostate cancer Brother    Stroke Paternal Grandmother    Stroke Maternal Aunt    Cancer Maternal Uncle    Social History   Socioeconomic History   Marital status: Divorced    Spouse name: Not on file   Number of children: Not on file   Years of education: Not on file   Highest education level: Not on file  Occupational History   Not on file  Tobacco Use   Smoking status: Former    Packs/day: .5    Types: Cigarettes    Quit date: 05/22/2012    Years since quitting: 10.8   Smokeless tobacco: Never  Vaping Use   Vaping Use: Never used  Substance and Sexual Activity   Alcohol use: No    Alcohol/week: 0.0 standard drinks of alcohol   Drug use: No   Sexual activity: Not Currently  Other Topics Concern   Not on file  Social History Narrative   Divorced, 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  Has 3 children.  Exercises some with walking.  04/2021   Social Determinants of Health   Financial Resource Strain: Low Risk  (04/05/2023)   Overall Financial Resource Strain (CARDIA)    Difficulty of Paying Living Expenses: Not hard at all  Food Insecurity: No Food Insecurity (04/05/2023)   Hunger Vital Sign    Worried About Running Out of Food in the Last Year:  Never true    Ran Out of Food in the Last Year: Never true  Transportation Needs: No Transportation Needs (04/05/2023)   PRAPARE - Administrator, Civil Service (Medical): No    Lack of Transportation (Non-Medical): No  Physical Activity: Inactive (04/05/2023)   Exercise Vital Sign    Days of Exercise per Week: 0 days    Minutes of Exercise per Session: 0 min  Stress: No Stress Concern Present (04/05/2023)   [employer] of Occupational Health - Occupational Stress Questionnaire    Feeling of Stress : Not at all  Social Connections: Not on  file    Tobacco Counseling Counseling given: Not Answered   Clinical Intake:  Pre-visit preparation completed: Yes  Pain : No/denies pain     Nutritional Status: BMI > 30  Obese Nutritional Risks: None Diabetes: Yes  How often do you need to have someone help you when you read instructions, pamphlets, or other written materials from your doctor or pharmacy?: 1 - Never  Diabetic? Yes Nutrition Risk Assessment:  Has the patient had any N/V/D within the last 2 months?  No  Does the patient have any non-healing wounds?  No  Has the patient had any unintentional weight loss or weight gain?  No   Diabetes:  Is the patient diabetic?  Yes  If diabetic, was a CBG obtained today?  No  Did the patient bring in their glucometer from home?  No  How often do you monitor your CBG's? daily.   Financial Strains and Diabetes Management:  Are you having any financial strains with the device, your supplies or your medication? No .  Does the patient want to be seen by Chronic Care Management for management of their diabetes?  No  Would the patient like to be referred to a Nutritionist or for Diabetic Management?  No   Diabetic Exams:  Diabetic Eye Exam: Completed 04/28/2022 Diabetic Foot Exam: Completed 10/18/2022  Interpreter Needed?: No  Information entered by :: NAllen LPN   Activities of Daily Living    04/05/2023    3:04 PM  04/16/2022   11:03 AM  In your present state of health, do you have any difficulty performing the following activities:  Hearing? 1 1  Comment has hearing aid hearing aid right ear  Vision? 1 0  Difficulty concentrating or making decisions? 0 0  Walking or climbing stairs? 0 0  Dressing or bathing? 0 0  Doing errands, shopping? 0 0  Preparing Food and eating ? N N  Using the Toilet? N N  In the past six months, have you accidently leaked urine? Y Y  Do you have problems with loss of bowel control? N N  Managing your Medications? N N  Managing your Finances? N N  Housekeeping or managing your Housekeeping? N N    Patient Care Team: Tysinger, Kermit Balo, PA-C as PCP - General (Family Medicine) Meriam Sprague, MD as PCP - Cardiology (Cardiology) Alfredo Martinez, MD as Consulting Physician (Urology) [place], [name], [facility] (Pharmacist)  Indicate any recent Medical Services you may have received from other than [facility] providers in the past year (date may be approximate).     Assessment:   This is a routine wellness examination for Webb.  Hearing/Vision screen Vision Screening - Comments:: Regular eye exams,  Opth  Dietary issues and exercise activities discussed: Current Exercise Habits: The patient does not participate in regular exercise at present   Goals Addressed             This Visit's Progress    Patient Stated       04/05/2023, no goals       Depression Screen    04/05/2023    3:04 PM 07/12/2022   10:16 AM 04/16/2022   11:01 AM 02/02/2022   10:04 AM 07/20/2021   11:11 AM 04/15/2021    8:54 AM 04/07/2020   10:03 AM  PHQ 2/9 Scores  PHQ - 2 Score 0 0 2 0 0 1 0  PHQ- 9 Score 0  3   1     Fall Risk  04/05/2023    3:04 PM 07/12/2022   10:16 AM 04/16/2022   11:00 AM 02/02/2022   10:04 AM 07/20/2021   11:11 AM  Fall Risk   Falls in the past year? 0 0 0 0 1  Number falls in past yr: 0 0 0 0 0  Injury with Fall? 0 0 0 0 1  Risk for fall due to :  Medication side effect No Fall Risks Medication side effect No Fall Risks Impaired balance/gait  Follow up Falls prevention discussed;Education provided;Falls evaluation completed Falls evaluation completed Falls evaluation completed;Education provided;Falls prevention discussed Falls evaluation completed Falls evaluation completed    FALL RISK PREVENTION PERTAINING TO THE HOME:  Any stairs in or around the home? No  If so, are there any without handrails? N/a Home free of loose throw rugs in walkways, pet beds, electrical cords, etc? Yes  Adequate lighting in your home to reduce risk of falls? Yes   ASSISTIVE DEVICES UTILIZED TO PREVENT FALLS:  Life alert? No  Use of a cane, walker or w/c? No  Grab bars in the bathroom? Yes  Shower chair or bench in shower? No  Elevated toilet seat or a handicapped toilet? Yes   TIMED UP AND GO:  Was the test performed? No .      Cognitive Function:        04/05/2023    3:06 PM  6CIT Screen  What Year? 0 points  What month? 0 points  What time? 0 points  Count back from 20 0 points  Months in reverse 0 points  Repeat phrase 4 points  Total Score 4 points    Immunizations Immunization History  Administered Date(s) Administered   Fluad Quad(high Dose 65+) 07/09/2020, 07/20/2021, 07/12/2022   Influenza Split 08/12/2011, 08/15/2012, 07/15/2014   Influenza Whole 07/29/2008, 08/14/2009, 12/23/2010   Influenza, High Dose Seasonal PF 08/28/2013, 08/04/2015, 08/22/2018   Influenza,inj,Quad PF,6+ Mos 10/13/2016   Influenza-Unspecified 06/03/2019   PFIZER(Purple Top)SARS-COV-2 Vaccination 12/08/2019, 01/02/2020   Pneumococcal Conjugate-13 01/30/2014   Pneumococcal Polysaccharide-23 12/23/2010, 12/02/2020   Pneumococcal-Unspecified 12/23/2010   Td 05/26/2021   Tdap 03/01/2012   Zoster Recombinat (Shingrix) 04/18/2021, 12/22/2021    TDAP status: Up to date  Flu Vaccine status: Up to date  Pneumococcal vaccine status: Up to  date  Covid-19 vaccine status: Completed vaccines  Qualifies for Shingles Vaccine? Yes   Zostavax completed Yes   Shingrix Completed?: Yes  Screening Tests Health Maintenance  Topic Date Due   COVID-19 Vaccine (3 - Pfizer risk series) 01/30/2020   Medicare Annual Wellness (AWV)  04/17/2023   OPHTHALMOLOGY EXAM  04/29/2023   INFLUENZA VACCINE  06/02/2023   HEMOGLOBIN A1C  07/13/2023   Diabetic kidney evaluation - Urine ACR  10/19/2023   FOOT EXAM  10/19/2023   Diabetic kidney evaluation - eGFR measurement  01/10/2024   DTaP/Tdap/Td (3 - Td or Tdap) 05/27/2031   Pneumonia Vaccine 53+ Years old  Completed   DEXA SCAN  Completed   Zoster Vaccines- Shingrix  Completed   HPV VACCINES  Aged Out    Health Maintenance  Health Maintenance Due  Topic Date Due   COVID-19 Vaccine (3 - Pfizer risk series) 01/30/2020   Medicare Annual Wellness (AWV)  04/17/2023    Colorectal cancer screening: No longer required.   Mammogram status: No longer required due to age.  Bone Density status: Completed 12/09/2020.   Lung Cancer Screening: (Low Dose CT Chest recommended if Age 37-80 years, 30 pack-year currently smoking OR have  quit w/in 15years.) does not qualify.   Lung Cancer Screening Referral: no  Additional Screening:  Hepatitis C Screening: does not qualify;   Vision Screening: Recommended annual ophthalmology exams for early detection of glaucoma and other disorders of the eye. Is the patient up to date with their annual eye exam?  Yes  Who is the provider or what is the name of the office in which the patient attends annual eye exams? [facility] If pt is not established with a provider, would they like to be referred to a provider to establish care? No .   Dental Screening: Recommended annual dental exams for proper oral hygiene  Community Resource Referral / Chronic Care Management: CRR required this visit?  No   CCM required this visit?  No      Plan:     I have  personally reviewed and noted the following in the patient's chart:   Medical and social history Use of alcohol, tobacco or illicit drugs  Current medications and supplements including opioid prescriptions. Patient is not currently taking opioid prescriptions. Functional ability and status Nutritional status Physical activity Advanced directives List of other physicians Hospitalizations, surgeries, and ER visits in previous 12 months Vitals Screenings to include cognitive, depression, and falls Referrals and appointments  In addition, I have reviewed and discussed with patient certain preventive protocols, quality metrics, and best practice recommendations. A written personalized care plan for preventive services as well as general preventive health recommendations were provided to patient.     Barb Merino, LPN   11/06/1094   Nurse Notes: none  Due to this being a virtual visit, the after visit summary with patients personalized plan was offered to patient via mail or my-chart. Patient would like to access on my-chart

## 2023-04-05 NOTE — Patient Instructions (Signed)
Marissa Oliver , Thank you for taking time to come for your Medicare Wellness Visit. I appreciate your ongoing commitment to your health goals. Please review the following plan we discussed and let me know if I can assist you in the future.   These are the goals we discussed:  Goals      Patient Stated     04/16/2022, no goals     Patient Stated     04/05/2023, no goals        This is a list of the screening recommended for you and due dates:  Health Maintenance  Topic Date Due   COVID-19 Vaccine (3 - Pfizer risk series) 01/30/2020   Eye exam for diabetics  04/29/2023   Flu Shot  06/02/2023   Hemoglobin A1C  07/13/2023   Yearly kidney health urinalysis for diabetes  10/19/2023   Complete foot exam   10/19/2023   Yearly kidney function blood test for diabetes  01/10/2024   Medicare Annual Wellness Visit  04/04/2024   DTaP/Tdap/Td vaccine (3 - Td or Tdap) 05/27/2031   Pneumonia Vaccine  Completed   DEXA scan (bone density measurement)  Completed   Zoster (Shingles) Vaccine  Completed   HPV Vaccine  Aged Out    Advanced directives: copy in chart  Conditions/risks identified: none  Next appointment: Follow up in one year for your annual wellness visit    Preventive Care 65 Years and Older, Female Preventive care refers to lifestyle choices and visits with your health care provider that can promote health and wellness. What does preventive care include? A yearly physical exam. This is also called an annual well check. Dental exams once or twice a year. Routine eye exams. Ask your health care provider how often you should have your eyes checked. Personal lifestyle choices, including: Daily care of your teeth and gums. Regular physical activity. Eating a healthy diet. Avoiding tobacco and drug use. Limiting alcohol use. Practicing safe sex. Taking low-dose aspirin every day. Taking vitamin and mineral supplements as recommended by your health care provider. What happens  during an annual well check? The services and screenings done by your health care provider during your annual well check will depend on your age, overall health, lifestyle risk factors, and family history of disease. Counseling  Your health care provider may ask you questions about your: Alcohol use. Tobacco use. Drug use. Emotional well-being. Home and relationship well-being. Sexual activity. Eating habits. History of falls. Memory and ability to understand (cognition). Work and work Astronomer. Reproductive health. Screening  You may have the following tests or measurements: Height, weight, and BMI. Blood pressure. Lipid and cholesterol levels. These may be checked every 5 years, or more frequently if you are over 28 years old. Skin check. Lung cancer screening. You may have this screening every year starting at age 72 if you have a 30-pack-year history of smoking and currently smoke or have quit within the past 15 years. Fecal occult blood test (FOBT) of the stool. You may have this test every year starting at age 81. Flexible sigmoidoscopy or colonoscopy. You may have a sigmoidoscopy every 5 years or a colonoscopy every 10 years starting at age 64. Hepatitis C blood test. Hepatitis B blood test. Sexually transmitted disease (STD) testing. Diabetes screening. This is done by checking your blood sugar (glucose) after you have not eaten for a while (fasting). You may have this done every 1-3 years. Bone density scan. This is done to screen for osteoporosis. You may  have this done starting at age 25. Mammogram. This may be done every 1-2 years. Talk to your health care provider about how often you should have regular mammograms. Talk with your health care provider about your test results, treatment options, and if necessary, the need for more tests. Vaccines  Your health care provider may recommend certain vaccines, such as: Influenza vaccine. This is recommended every  year. Tetanus, diphtheria, and acellular pertussis (Tdap, Td) vaccine. You may need a Td booster every 10 years. Zoster vaccine. You may need this after age 39. Pneumococcal 13-valent conjugate (PCV13) vaccine. One dose is recommended after age 66. Pneumococcal polysaccharide (PPSV23) vaccine. One dose is recommended after age 73. Talk to your health care provider about which screenings and vaccines you need and how often you need them. This information is not intended to replace advice given to you by your health care provider. Make sure you discuss any questions you have with your health care provider. Document Released: 11/14/2015 Document Revised: 07/07/2016 Document Reviewed: 08/19/2015 Elsevier Interactive Patient Education  2017 [employer].  Fall Prevention in the Home Falls can cause injuries. They can happen to people of all ages. There are many things you can do to make your home safe and to help prevent falls. What can I do on the outside of my home? Regularly fix the edges of walkways and driveways and fix any cracks. Remove anything that might make you trip as you walk through a door, such as a raised step or threshold. Trim any bushes or trees on the path to your home. Use bright outdoor lighting. Clear any walking paths of anything that might make someone trip, such as rocks or tools. Regularly check to see if handrails are loose or broken. Make sure that both sides of any steps have handrails. Any raised decks and porches should have guardrails on the edges. Have any leaves, snow, or ice cleared regularly. Use sand or salt on walking paths during winter. Clean up any spills in your garage right away. This includes oil or grease spills. What can I do in the bathroom? Use night lights. Install grab bars by the toilet and in the tub and shower. Do not use towel bars as grab bars. Use non-skid mats or decals in the tub or shower. If you need to sit down in the shower, use a  plastic, non-slip stool. Keep the floor dry. Clean up any water that spills on the floor as soon as it happens. Remove soap buildup in the tub or shower regularly. Attach bath mats securely with double-sided non-slip rug tape. Do not have throw rugs and other things on the floor that can make you trip. What can I do in the bedroom? Use night lights. Make sure that you have a light by your bed that is easy to reach. Do not use any sheets or blankets that are too big for your bed. They should not hang down onto the floor. Have a firm chair that has side arms. You can use this for support while you get dressed. Do not have throw rugs and other things on the floor that can make you trip. What can I do in the kitchen? Clean up any spills right away. Avoid walking on wet floors. Keep items that you use a lot in easy-to-reach places. If you need to reach something above you, use a strong step stool that has a grab bar. Keep electrical cords out of the way. Do not use floor polish  or wax that makes floors slippery. If you must use wax, use non-skid floor wax. Do not have throw rugs and other things on the floor that can make you trip. What can I do with my stairs? Do not leave any items on the stairs. Make sure that there are handrails on both sides of the stairs and use them. Fix handrails that are broken or loose. Make sure that handrails are as long as the stairways. Check any carpeting to make sure that it is firmly attached to the stairs. Fix any carpet that is loose or worn. Avoid having throw rugs at the top or bottom of the stairs. If you do have throw rugs, attach them to the floor with carpet tape. Make sure that you have a light switch at the top of the stairs and the bottom of the stairs. If you do not have them, ask someone to add them for you. What else can I do to help prevent falls? Wear shoes that: Do not have high heels. Have rubber bottoms. Are comfortable and fit you  well. Are closed at the toe. Do not wear sandals. If you use a stepladder: Make sure that it is fully opened. Do not climb a closed stepladder. Make sure that both sides of the stepladder are locked into place. Ask someone to hold it for you, if possible. Clearly mark and make sure that you can see: Any grab bars or handrails. First and last steps. Where the edge of each step is. Use tools that help you move around (mobility aids) if they are needed. These include: Canes. Walkers. Scooters. Crutches. Turn on the lights when you go into a dark area. Replace any light bulbs as soon as they burn out. Set up your furniture so you have a clear path. Avoid moving your furniture around. If any of your floors are uneven, fix them. If there are any pets around you, be aware of where they are. Review your medicines with your doctor. Some medicines can make you feel dizzy. This can increase your chance of falling. Ask your doctor what other things that you can do to help prevent falls. This information is not intended to replace advice given to you by your health care provider. Make sure you discuss any questions you have with your health care provider. Document Released: 08/14/2009 Document Revised: 03/25/2016 Document Reviewed: 11/22/2014 Elsevier Interactive Patient Education  2017 [employer].

## 2023-04-07 ENCOUNTER — Ambulatory Visit: Payer: Medicare HMO | Admitting: Nurse Practitioner

## 2023-04-07 ENCOUNTER — Encounter: Payer: Self-pay | Admitting: Nurse Practitioner

## 2023-04-07 VITALS — BP 106/56 | HR 81 | Ht 62.0 in | Wt 160.1 lb

## 2023-04-07 DIAGNOSIS — K5904 Chronic idiopathic constipation: Secondary | ICD-10-CM

## 2023-04-07 NOTE — Progress Notes (Signed)
Assessment and Plan   Primary GI: Previously Tressia Danas, MD  Brief Narrative:  82 y.o. yo female whose past medical history includes but is not necessarily limited to chronic idiopathic constipation, GERD, diabetes, anxiety, depression, colon polyps, diverticulitis.   Chronic constipation with decreased frequency and also difficult evacuation. Having lower abdominal bloating and LLQ discomfort which she feels is constipated related.  -Continue daily polyethylene glycol ( "miralax") once daily.  -Continue Linzess everyday on an empty stomach -Add two docusate sodium at bedtime ( stool softeners) -Increase water intake to 64 oz daily  -Can take Ducolax tablets as needed for persistent constipation.  -I have asked her to call me in a few weeks with a condition update.  If constipation has not improved then ill consider colonoscopy ( she gets around well and I believe she could tolerate a prep)  Her last colonoscopy was in 2011 with findings of hyperplastic polyps and 1 small tubular adenoma  GERD, asymptomatic on daily PPI  History of Present Illness   Chief complaint:  Follow up on chronic constipation  Patient was last seen by me November 2023 . Please refer to that note for details.   Ms Tesh has been taking daily polyethylene glycol, daily Linzess. Sounds like she is taking something else for constipation but she doesn't know what.  With this regimen she still doesn't have a BM every day.  She feels bloated in her lower abdomen and has LLQ discomfort as well.  She feels like her symptoms are secondary to constipation . Generally has about 3 BMs a week. She drinks about 3 bottles of water a day. She has problems with urinary continence so not sure she can tolerate more water. No rectal bleeding. Weight is stable.   Previous GI Endoscopies / Labs / Imaging   Colonoscopy in 2011 by Dr. Loreta Ave -Prep was fair.  Multiple sessile polyps removed.  1 polyp was a tubular adenoma      Latest Ref Rng & Units 01/10/2023   12:03 PM 07/12/2022   11:34 AM 04/16/2021    9:33 AM  Hepatic Function  Total Protein 6.0 - 8.5 g/dL 6.5  7.0  6.8   Albumin 3.7 - 4.7 g/dL 4.4  4.8  4.6   AST 0 - 40 IU/L 17  18  11    ALT 0 - 32 IU/L 23  16  16    Alk Phosphatase 44 - 121 IU/L 90  97  95   Total Bilirubin 0.0 - 1.2 mg/dL 0.2  0.2  0.3        Latest Ref Rng & Units 01/10/2023   12:03 PM 02/03/2022    8:30 AM 12/02/2020   11:38 AM  CBC  WBC 3.4 - 10.8 x10E3/uL 6.0  5.6  5.8   Hemoglobin 11.1 - 15.9 g/dL 16.1  09.6  04.5   Hematocrit 34.0 - 46.6 % 37.1  37.7  42.0   Platelets 150 - 450 x10E3/uL 204  181  154     Past Medical History:  Diagnosis Date   Atrophic vaginitis    estrace cream rx'd by Dr. Vernie Ammons   Colon polyp    tubular adenoma (colonoscopy q5 yrs)   Diabetes mellitus 2005   Diverticulosis    Dyslipidemia    GAD (generalized anxiety disorder)    Hemorrhoids    Hyperlipidemia    OAB (overactive bladder)    Osteoporosis    Psoriasis    mild, sees [facility]  Past Surgical History:  Procedure Laterality Date   ABDOMINAL HYSTERECTOMY  1972   partial   APPENDECTOMY     CARDIOVASCULAR STRESS TEST  12/25/2004   EF 65%. OVERALL NORMAL STRESS CARDIOLITE. NO EVIDENCE  OF ISCHEMIA   COLONOSCOPY  08/2010   cologuard 01/2016 normal   PUBOVAGINAL SLING  04/1998   TUBAL LIGATION      Current Medications, Allergies, Family History and Social History were reviewed in [employer] record.     Current Outpatient Medications  Medication Sig Dispense Refill   aspirin EC 81 MG tablet Take 1 tablet (81 mg total) by mouth every other day. Swallow whole. 90 tablet 3   Blood Glucose Monitoring Suppl (ONETOUCH VERIO) w/Device KIT Test twice a day 1 kit 0   cholecalciferol (VITAMIN D3) 25 MCG (1000 UNIT) tablet Take 1,000 Units by mouth daily.     Fiber POWD Take 1 Dose by mouth as needed.     Glucose Blood (BLOOD GLUCOSE TEST STRIPS) STRP Test 1-2  times a day 100 strip 1   HYDROcodone-acetaminophen (NORCO) 5-325 MG tablet Take 1 tablet by mouth every 6 (six) hours as needed. 30 tablet 0   insulin detemir (LEVEMIR FLEXTOUCH) 100 UNIT/ML FlexPen Inject 50 Units into the skin daily. 15 mL 5   Insulin Pen Needle (B-D UF III MINI PEN NEEDLES) 31G X 5 MM MISC USE ONCE DAILY 100 each 5   LINZESS 290 MCG CAPS capsule Take 1 capsule (290 mcg total) by mouth daily before breakfast. Take 1, Every morning before breakfast 30 capsule 3   lisinopril (ZESTRIL) 5 MG tablet TAKE ONE TABLET BY MOUTH DAILY 90 tablet 1   metFORMIN (GLUCOPHAGE) 500 MG tablet Take 1 tablet (500 mg total) by mouth 2 (two) times daily with a meal. (Patient not taking: Reported on 04/05/2023) 90 tablet 1   METFORMIN HCL ER PO Take 500 mg by mouth daily.     nitrofurantoin, macrocrystal-monohydrate, (MACROBID) 100 MG capsule Take 1 capsule (100 mg total) by mouth 2 (two) times daily. 10 capsule 0   omeprazole (PRILOSEC) 40 MG capsule Take 1 capsule (40 mg total) by mouth every morning 90 capsule 3   OneTouch Delica Lancets 33G MISC 1 each by Does not apply route in the morning and at bedtime. Dx:E11.9 100 each 2   oxybutynin (DITROPAN-XL) 5 MG 24 hr tablet Take 1 tablet (5 mg total) by mouth at bedtime. (Patient not taking: Reported on 04/05/2023) 90 tablet 0   phenazopyridine (PYRIDIUM) 100 MG tablet Take 1 tablet (100 mg total) by mouth 3 (three) times daily as needed for pain. (Patient not taking: Reported on 04/05/2023) 20 tablet 0   polyethylene glycol powder (GLYCOLAX/MIRALAX) 17 GM/SCOOP powder Take 17 g by mouth daily.     rosuvastatin (CRESTOR) 20 MG tablet Take 1 tablet (20 mg total) by mouth daily. 90 tablet 3   vitamin B-6 (PYRIDOXINE) 25 MG tablet Take 1 tablet (25 mg total) by mouth daily. 90 tablet 0   No current facility-administered medications for this visit.    Review of Systems: No chest pain. No shortness of breath. No urinary complaints.    Physical Exam  Wt  Readings from Last 3 Encounters:  04/05/23 165 lb (74.8 kg)  01/10/23 160 lb 12.8 oz (72.9 kg)  01/02/23 165 lb (74.8 kg)    BP (!) 106/56   Pulse 81   Ht 5\' 2"  (1.575 m)   Wt 160 lb 2 oz (72.6 kg)  BMI 29.29 kg/m  Constitutional:  Pleasant, generally well appearing female in no acute distress. Psychiatric: Normal mood and affect. Behavior is normal. EENT: Pupils normal.  Conjunctivae are normal. No scleral icterus. Neck supple.  Cardiovascular: Normal rate, regular rhythm.  Pulmonary/chest: Effort normal and breath sounds normal. No wheezing, rales or rhonchi. Abdominal: Soft, nondistended, nontender. Bowel sounds active throughout. There are no masses palpable. No hepatomegaly. Neurological: Alert and oriented to person place and time.  Skin: Skin is warm and dry. No rashes noted.  Willette Cluster, NP  04/07/2023, 8:52 AM

## 2023-04-07 NOTE — Progress Notes (Signed)
Agree with assessment and plan as outlined.  

## 2023-04-07 NOTE — Patient Instructions (Addendum)
_______________________________________________________  If your blood pressure at your visit was 140/90 or greater, please contact your primary care physician to follow up on this. _______________________________________________________  If you are age 84 or older, your body mass index should be between 23-30. Your Body mass index is 29.29 kg/m. If this is out of the aforementioned range listed, please consider follow up with your Primary Care Provider. ________________________________________________________  The [facility] providers would like to encourage you to use [facility] to communicate with providers for non-urgent requests or questions.  Due to long hold times on the telephone, sending your provider a message by [facility] may be a faster and more efficient way to get a response.  Please allow 48 business hours for a response.  Please remember that this is for non-urgent requests.  _______________________________________________________  CONSTIPATION:   Continue daily polyethylene glycol ( "miralax") once daily.   Continue Linzess everyday on an empty stomach  Add two docusate sodium at bedtime ( stool softeners)  Increase water intake to 64 oz daily   Can take Ducolax tabs as needed for persistent constipation.   Thank you for entrusting me with your care and choosing [facility].  Willette Cluster, NP   Call in a few weeks if above regimen isn't working. Otherwise, follow up in one year

## 2023-04-14 ENCOUNTER — Ambulatory Visit (INDEPENDENT_AMBULATORY_CARE_PROVIDER_SITE_OTHER): Payer: Medicare HMO | Admitting: Medical

## 2023-04-14 VITALS — BP 120/72 | HR 76 | Wt 161.4 lb

## 2023-04-14 DIAGNOSIS — E1169 Type 2 diabetes mellitus with other specified complication: Secondary | ICD-10-CM | POA: Diagnosis not present

## 2023-04-14 DIAGNOSIS — E531 Pyridoxine deficiency: Secondary | ICD-10-CM

## 2023-04-14 DIAGNOSIS — Z79899 Other long term (current) drug therapy: Secondary | ICD-10-CM

## 2023-04-14 DIAGNOSIS — I781 Nevus, non-neoplastic: Secondary | ICD-10-CM

## 2023-04-14 DIAGNOSIS — E118 Type 2 diabetes mellitus with unspecified complications: Secondary | ICD-10-CM | POA: Diagnosis not present

## 2023-04-14 DIAGNOSIS — N3281 Overactive bladder: Secondary | ICD-10-CM

## 2023-04-14 DIAGNOSIS — E785 Hyperlipidemia, unspecified: Secondary | ICD-10-CM | POA: Diagnosis not present

## 2023-04-14 NOTE — Progress Notes (Addendum)
Subjective:  Marissa Oliver is a 82 y.o. female who presents for Chief Complaint  Patient presents with   Medical Management of Chronic Issues    Diabetes      Here for follow-up on chronic issues  Diabetes-compliant with Levemir 45 units daily, metformin 500 mg once daily and home glucose 70-100s.    No blurred vision, no polydipsia.     Hyperlipidemia-compliant with Crestor 20 mg daily and aspirin 81 mg daily.  No side effects reported  Compliant with lisinopril for kidney protection  OAB - takes oxybutynin once daily, but doesn't help a lot.  Just saw urology recently, had procedure to evaluate bladder, frequent UTI.   Urology put her on Estrace cream twice weekly and pyridium 100mg  daily.  Tried mybetriq samples again recently but that didn't help.  No other aggravating or relieving factors.    No other c/o.  Past Medical History:  Diagnosis Date   Atrophic vaginitis    estrace cream rx'd by Dr. Vernie Ammons   Colon polyp    tubular adenoma (colonoscopy q5 yrs)   Diabetes mellitus 2005   Diverticulosis    Dyslipidemia    GAD (generalized anxiety disorder)    Hemorrhoids    Hyperlipidemia    OAB (overactive bladder)    Osteoporosis    Psoriasis    mild, sees [facility]   Current Outpatient Medications on File Prior to Visit  Medication Sig Dispense Refill   aspirin EC 81 MG tablet Take 1 tablet (81 mg total) by mouth every other day. Swallow whole. 90 tablet 3   HYDROcodone-acetaminophen (NORCO) 5-325 MG tablet Take 1 tablet by mouth every 6 (six) hours as needed. 30 tablet 0   insulin detemir (LEVEMIR FLEXTOUCH) 100 UNIT/ML FlexPen Inject 50 Units into the skin daily. 15 mL 5   LINZESS 290 MCG CAPS capsule Take 1 capsule (290 mcg total) by mouth daily before breakfast. Take 1, Every morning before breakfast 30 capsule 3   lisinopril (ZESTRIL) 5 MG tablet TAKE ONE TABLET BY MOUTH DAILY 90 tablet 1   metFORMIN (GLUCOPHAGE) 500 MG tablet Take 1 tablet (500 mg total)  by mouth 2 (two) times daily with a meal. (Patient taking differently: Take 500 mg by mouth at bedtime.) 90 tablet 1   nitrofurantoin (MACRODANTIN) 100 MG capsule Take 100 mg by mouth daily.     omeprazole (PRILOSEC) 40 MG capsule Take 1 capsule (40 mg total) by mouth every morning 90 capsule 3   oxybutynin (DITROPAN-XL) 5 MG 24 hr tablet Take 1 tablet (5 mg total) by mouth at bedtime. 90 tablet 0   phenazopyridine (PYRIDIUM) 100 MG tablet Take 1 tablet (100 mg total) by mouth 3 (three) times daily as needed for pain. 20 tablet 0   polyethylene glycol powder (GLYCOLAX/MIRALAX) 17 GM/SCOOP powder Take 17 g by mouth daily.     rosuvastatin (CRESTOR) 20 MG tablet Take 1 tablet (20 mg total) by mouth daily. 90 tablet 3   vitamin B-6 (PYRIDOXINE) 25 MG tablet Take 1 tablet (25 mg total) by mouth daily. 90 tablet 0   Blood Glucose Monitoring Suppl (ONETOUCH VERIO) w/Device KIT Test twice a day 1 kit 0   Fiber POWD Take 1 Dose by mouth as needed.     Glucose Blood (BLOOD GLUCOSE TEST STRIPS) STRP Test 1-2 times a day 100 strip 1   Insulin Pen Needle (B-D UF III MINI PEN NEEDLES) 31G X 5 MM MISC USE ONCE DAILY 100 each 5  OneTouch Delica Lancets 33G MISC 1 each by Does not apply route in the morning and at bedtime. Dx:E11.9 100 each 2   No current facility-administered medications on file prior to visit.     The following portions of the patient's history were reviewed and updated as appropriate: allergies, current medications, past family history, past medical history, past social history, past surgical history and problem list.  ROS Otherwise as in subjective above  Objective: BP 120/72   Pulse 76   Wt 161 lb 6.4 oz (73.2 kg)   BMI 29.52 kg/m   General appearance: alert, no distress, well developed, well nourished Skin: spider veins left popliteal area Neck: supple, no lymphadenopathy, no thyromegaly, no masses Heart: RRR, normal S1, S2, no murmurs Lungs: CTA bilaterally, no wheezes,  rhonchi, or rales Pulses: 2+ radial pulses, 2+ pedal pulses, normal cap refill Ext: no edema    Assessment: Encounter Diagnoses  Name Primary?   Medication management Yes   Type 2 diabetes mellitus with complication, without long-term current use of insulin (HCC)    Dyslipidemia associated with type 2 diabetes mellitus (HCC)    Overactive bladder    Vitamin B6 deficiency    Spider veins      Plan: Diabetes  Update labs today Continue metformin once daily, Levemir 45u daily Continue Lisinopril 5mg  daily for renal protection Continue glucose monitoring Continue daily foot check   Dyslipidemia - continue Crestor 20mg . Last labs at goal within past 12 months   OAB - sees urology, not much improved despite trial of several medications.  Currently on pyridium daily, estrace cream 2 x per week, oxybutynin daily, antibiotic prophylaxis.    Spider veins - referral to vein specialist  Marissa Oliver was seen today for medical management of chronic issues.  Diagnoses and all orders for this visit:  Medication management -     Comprehensive metabolic panel  Type 2 diabetes mellitus with complication, without long-term current use of insulin (HCC) -     Hemoglobin A1c -     Comprehensive metabolic panel  Dyslipidemia associated with type 2 diabetes mellitus (HCC)  Overactive bladder  Vitamin B6 deficiency  Spider veins -     Ambulatory referral to Vascular Surgery    Follow up: pending labs

## 2023-04-14 NOTE — Addendum Note (Signed)
Addended by: Jac Canavan on: 04/14/2023 10:52 AM   Modules accepted: Orders

## 2023-04-15 ENCOUNTER — Other Ambulatory Visit: Payer: Self-pay | Admitting: Medical

## 2023-04-15 LAB — COMPREHENSIVE METABOLIC PANEL
ALT: 19 IU/L (ref 0–32)
AST: 18 IU/L (ref 0–40)
Albumin/Globulin Ratio: 2.2
Albumin: 4.6 g/dL (ref 3.7–4.7)
Alkaline Phosphatase: 108 IU/L (ref 44–121)
BUN/Creatinine Ratio: 23 (ref 12–28)
BUN: 18 mg/dL (ref 8–27)
Bilirubin Total: 0.3 mg/dL (ref 0.0–1.2)
CO2: 22 mmol/L (ref 20–29)
Calcium: 9.7 mg/dL (ref 8.7–10.3)
Chloride: 102 mmol/L (ref 96–106)
Creatinine, Ser: 0.8 mg/dL (ref 0.57–1.00)
Globulin, Total: 2.1 g/dL (ref 1.5–4.5)
Glucose: 153 mg/dL — ABNORMAL HIGH (ref 70–99)
Potassium: 4.3 mmol/L (ref 3.5–5.2)
Sodium: 139 mmol/L (ref 134–144)
Total Protein: 6.7 g/dL (ref 6.0–8.5)
eGFR: 73 mL/min/{1.73_m2} (ref >59)

## 2023-04-15 LAB — HEMOGLOBIN A1C
Est. average glucose Bld gHb Est-mCnc: 177 mg/dL
Hgb A1c MFr Bld: 7.8 % — ABNORMAL HIGH (ref 4.8–5.6)

## 2023-04-15 MED ORDER — ROSUVASTATIN CALCIUM 20 MG PO TABS: 20.0000 mg | ORAL_TABLET | Freq: Every day | ORAL | 3 refills | Status: DC

## 2023-04-15 MED ORDER — METFORMIN HCL 500 MG PO TABS: ORAL_TABLET | ORAL | 2 refills | Status: DC

## 2023-04-15 MED ORDER — LEVEMIR FLEXTOUCH 100 UNIT/ML ~~LOC~~ SOPN: 55.0000 [IU] | PEN_INJECTOR | Freq: Every day | SUBCUTANEOUS | 5 refills | Status: DC

## 2023-04-15 MED ORDER — OXYBUTYNIN CHLORIDE ER 5 MG PO TB24: 5.0000 mg | ORAL_TABLET | Freq: Every day | ORAL | 3 refills | Status: DC

## 2023-04-15 NOTE — Progress Notes (Signed)
Results sent through MyChart

## 2023-04-20 DIAGNOSIS — D225 Melanocytic nevi of trunk: Secondary | ICD-10-CM | POA: Diagnosis not present

## 2023-04-20 DIAGNOSIS — L814 Other melanin hyperpigmentation: Secondary | ICD-10-CM | POA: Diagnosis not present

## 2023-04-20 DIAGNOSIS — L821 Other seborrheic keratosis: Secondary | ICD-10-CM | POA: Diagnosis not present

## 2023-04-20 DIAGNOSIS — Z08 Encounter for follow-up examination after completed treatment for malignant neoplasm: Secondary | ICD-10-CM | POA: Diagnosis not present

## 2023-04-20 DIAGNOSIS — Z85828 Personal history of other malignant neoplasm of skin: Secondary | ICD-10-CM | POA: Diagnosis not present

## 2023-04-20 NOTE — Telephone Encounter (Signed)
Ok notes say increase Levemir to 55 units, but I do not believe this pt is on Levemir anymore & Levemir Pt Assistance was discontinued completely 12/23.   She is on Lantus thru Pt Assistance & is approved til the end of the year. There is no Rx attached to the fax that pharmacist Angela sent in so not sure of Lantus dosage.  If you are wanting to increase the Lantus please have Sabrina call in the dosage increase to [facility] t# [phone] & please correct the medication list.

## 2023-04-24 ENCOUNTER — Other Ambulatory Visit: Payer: Self-pay | Admitting: Medical

## 2023-04-24 MED ORDER — INSULIN GLARGINE 100 UNITS/ML SOLOSTAR PEN: 55.0000 [IU] | PEN_INJECTOR | Freq: Every day | SUBCUTANEOUS | 5 refills | Status: DC

## 2023-04-28 NOTE — Telephone Encounter (Signed)
I am waiting on a fax to come in to fill out with increased daily units and will fax back

## 2023-04-29 NOTE — Telephone Encounter (Signed)
Pt was notified and I have faxed over updated medication to be increased.   Pt has 1 pen left of levemir that she is finishing with 50units and will switch to Lantus.

## 2023-05-03 DIAGNOSIS — Z961 Presence of intraocular lens: Secondary | ICD-10-CM | POA: Diagnosis not present

## 2023-05-03 DIAGNOSIS — E119 Type 2 diabetes mellitus without complications: Secondary | ICD-10-CM | POA: Diagnosis not present

## 2023-05-03 DIAGNOSIS — H40023 Open angle with borderline findings, high risk, bilateral: Secondary | ICD-10-CM | POA: Diagnosis not present

## 2023-05-03 DIAGNOSIS — H52203 Unspecified astigmatism, bilateral: Secondary | ICD-10-CM | POA: Diagnosis not present

## 2023-05-03 LAB — HM DIABETES EYE EXAM

## 2023-05-04 ENCOUNTER — Encounter: Payer: Self-pay | Admitting: Internal Medicine

## 2023-05-17 ENCOUNTER — Telehealth: Payer: Self-pay | Admitting: Medical

## 2023-05-17 NOTE — Telephone Encounter (Signed)
Recv'd refill request PT ASSISTANCE LANTUS faxed with dosage increase to 55 units

## 2023-05-29 DIAGNOSIS — H1032 Unspecified acute conjunctivitis, left eye: Secondary | ICD-10-CM | POA: Diagnosis not present

## 2023-06-03 ENCOUNTER — Ambulatory Visit (INDEPENDENT_AMBULATORY_CARE_PROVIDER_SITE_OTHER): Payer: Medicare HMO | Admitting: Medical

## 2023-06-03 VITALS — BP 110/78 | HR 68 | Temp 97.8°F | Wt 166.6 lb

## 2023-06-03 DIAGNOSIS — H1030 Unspecified acute conjunctivitis, unspecified eye: Secondary | ICD-10-CM

## 2023-06-03 DIAGNOSIS — H029 Unspecified disorder of eyelid: Secondary | ICD-10-CM

## 2023-06-03 MED ORDER — ERYTHROMYCIN 5 MG/GM OP OINT: 1.0000 | TOPICAL_OINTMENT | Freq: Every day | OPHTHALMIC | 0 refills | Status: DC

## 2023-06-03 NOTE — Progress Notes (Signed)
I have sent OV notes to PCP

## 2023-06-03 NOTE — Progress Notes (Signed)
Subjective:  Marissa Oliver is a 82 y.o. female who presents for Chief Complaint  Patient presents with   follow-up    Follow-up on eye, seen urgent care for pink eye but has a spot on the left eye know and wasn't sure if it was there the other day. Not sure if its a pus pocket     Here for urgent care follow up.  Went to urgent care 6 days ago for eye infection.  Was prescribed Ofloxacin drops and has been using most of this week.  Has a bump like a pimple on left upper eyelid.  Feels improved, not watery now.  Overall doing ok, not a lot of pain, just curious about the bump.  No URI symptoms, no fever.   No other aggravating or relieving factors.    No other c/o.  The following portions of the patient's history were reviewed and updated as appropriate: allergies, current medications, past family history, past medical history, past social history, past surgical history and problem list.  ROS Otherwise as in subjective above  Objective: BP 110/78   Pulse 68   Temp 97.8 F (36.6 C)   Wt 166 lb 9.6 oz (75.6 kg)   BMI 30.47 kg/m   General appearance: alert, no distress, well developed, well nourished HEENT: left upper eyelid laterally with small 3mm raised purplish gland, otherwise no erythema, slight crusting of eyelids, othweise normocephalic, sclerae anicteric, conjunctiva pink and moist, TMs pearly, nares patent, no discharge or erythema, pharynx normal     Assessment: Encounter Diagnoses  Name Primary?   Eyelid abnormality Yes   Acute conjunctivitis, unspecified acute conjunctivitis type, unspecified laterality      Plan: Overall improved from her urgent care visit a week ago.  Advise she do a few more days at least of ofloxacin drops along with warm compresses several times a day given the lesion on the left eyelid which is suggestive of a stye.  Go ahead and make an appointment with the eye doctor for a follow-up.  If much worse the next 2 days call back.  If not  improving or unchanged in the next 3 to 4 days can switch to E-Mycin ointment below.  Marissa Oliver was seen today for follow-up.  Diagnoses and all orders for this visit:  Eyelid abnormality  Acute conjunctivitis, unspecified acute conjunctivitis type, unspecified laterality  Other orders -     erythromycin ophthalmic ointment; Place 1 Application into the left eye at bedtime.    Follow up: prn

## 2023-06-13 ENCOUNTER — Other Ambulatory Visit: Payer: Self-pay | Admitting: Medical

## 2023-06-16 ENCOUNTER — Other Ambulatory Visit: Payer: Self-pay | Admitting: Medical

## 2023-06-16 NOTE — Telephone Encounter (Signed)
Pt should not be out. She was given #180 with 2 refills in June 2024

## 2023-06-22 DIAGNOSIS — D23121 Other benign neoplasm of skin of left upper eyelid, including canthus: Secondary | ICD-10-CM | POA: Diagnosis not present

## 2023-06-26 ENCOUNTER — Other Ambulatory Visit: Payer: Self-pay | Admitting: Medical

## 2023-07-05 NOTE — Telephone Encounter (Signed)
Called patient to verify the message she sent via MyChart. Patient states she wants to cancel appt request to be seen. No appt noted in chart. Patient states she will see her PCP prior to scheduling appt to be seen by Ms. Wilmon Pali, NP.

## 2023-07-08 ENCOUNTER — Ambulatory Visit (HOSPITAL_COMMUNITY): Payer: Self-pay

## 2023-07-09 ENCOUNTER — Other Ambulatory Visit: Payer: Self-pay | Admitting: Medical

## 2023-07-09 DIAGNOSIS — E118 Type 2 diabetes mellitus with unspecified complications: Secondary | ICD-10-CM

## 2023-07-12 ENCOUNTER — Ambulatory Visit: Payer: Medicare HMO | Admitting: Medical

## 2023-07-12 MED ORDER — ONETOUCH ULTRA TEST VI STRP: ORAL_STRIP | 1 refills | Status: DC

## 2023-07-13 ENCOUNTER — Other Ambulatory Visit: Payer: Self-pay | Admitting: Medical

## 2023-07-13 ENCOUNTER — Ambulatory Visit (INDEPENDENT_AMBULATORY_CARE_PROVIDER_SITE_OTHER): Payer: Medicare HMO | Admitting: Medical

## 2023-07-13 VITALS — BP 120/70 | HR 68 | Wt 166.2 lb

## 2023-07-13 DIAGNOSIS — Z7185 Encounter for immunization safety counseling: Secondary | ICD-10-CM

## 2023-07-13 DIAGNOSIS — Z794 Long term (current) use of insulin: Secondary | ICD-10-CM

## 2023-07-13 DIAGNOSIS — R200 Anesthesia of skin: Secondary | ICD-10-CM

## 2023-07-13 DIAGNOSIS — E559 Vitamin D deficiency, unspecified: Secondary | ICD-10-CM | POA: Diagnosis not present

## 2023-07-13 DIAGNOSIS — R202 Paresthesia of skin: Secondary | ICD-10-CM

## 2023-07-13 DIAGNOSIS — M810 Age-related osteoporosis without current pathological fracture: Secondary | ICD-10-CM

## 2023-07-13 DIAGNOSIS — E1169 Type 2 diabetes mellitus with other specified complication: Secondary | ICD-10-CM | POA: Diagnosis not present

## 2023-07-13 DIAGNOSIS — E785 Hyperlipidemia, unspecified: Secondary | ICD-10-CM

## 2023-07-13 MED ORDER — GABAPENTIN 100 MG PO CAPS: 100.0000 mg | ORAL_CAPSULE | Freq: Every day | ORAL | 1 refills | Status: DC

## 2023-07-13 MED ORDER — ASPIRIN 81 MG PO TBEC: 81.0000 mg | DELAYED_RELEASE_TABLET | Freq: Every day | ORAL | 3 refills | Status: AC

## 2023-07-13 NOTE — Progress Notes (Signed)
Subjective:  Marissa Oliver is a 82 y.o. female who presents for Chief Complaint  Patient presents with   Medical Management of Chronic Issues    Diabetes, having some numbness in feet      Here for follow-up on chronic issues  Diabetes-compliant with Lantus 40 units daily, metformin 500 mg daily in morning and 1/2 tablet in the evening.    No blurred vision, no polydipsia.     Hyperlipidemia-compliant with Crestor 20 mg daily and aspirin 81 mg daily.  No side effects reported  Compliant with lisinopril 5mg  for kidney protection  Having some numbness in both feet.  No regular back pain.  No swelling.     No other aggravating or relieving factors.    No other c/o.  Past Medical History:  Diagnosis Date   Atrophic vaginitis    estrace cream rx'd by Dr. Vernie Ammons   Colon polyp    tubular adenoma (colonoscopy q5 yrs)   Diabetes mellitus 2005   Diverticulosis    Dyslipidemia    GAD (generalized anxiety disorder)    Hemorrhoids    Hyperlipidemia    OAB (overactive bladder)    Osteoporosis    Psoriasis    mild, sees [facility]   Current Outpatient Medications on File Prior to Visit  Medication Sig Dispense Refill   Blood Glucose Monitoring Suppl (ONETOUCH VERIO) w/Device KIT Test twice a day 1 kit 0   Fiber POWD Take 1 Dose by mouth as needed.     glucose blood (ONETOUCH ULTRA TEST) test strip Use as instructed 200 strip 1   insulin glargine (LANTUS) 100 unit/mL SOPN Inject 55 Units into the skin daily. 15 mL 5   LINZESS 290 MCG CAPS capsule Take 1 capsule (290 mcg total) by mouth daily before breakfast. Take 1, Every morning before breakfast 30 capsule 3   lisinopril (ZESTRIL) 5 MG tablet TAKE 1 TABLET BY MOUTH DAILY 90 tablet 0   metFORMIN (GLUCOPHAGE) 500 MG tablet 500mg  daily in morning, 1/2 tablet or 250mg  QHS 180 tablet 2   omeprazole (PRILOSEC) 40 MG capsule Take 1 capsule (40 mg total) by mouth every morning 90 capsule 3   oxybutynin (DITROPAN-XL) 5 MG 24 hr  tablet Take 1 tablet (5 mg total) by mouth at bedtime. 90 tablet 3   rosuvastatin (CRESTOR) 20 MG tablet Take 1 tablet (20 mg total) by mouth daily. 90 tablet 3   vitamin B-6 (PYRIDOXINE) 25 MG tablet Take 1 tablet (25 mg total) by mouth daily. 90 tablet 0   Insulin Pen Needle (B-D UF III MINI PEN NEEDLES) 31G X 5 MM MISC USE ONCE DAILY 100 each 5   OneTouch Delica Lancets 33G MISC 1 each by Does not apply route in the morning and at bedtime. Dx:E11.9 100 each 2   polyethylene glycol powder (GLYCOLAX/MIRALAX) 17 GM/SCOOP powder Take 17 g by mouth daily.     No current facility-administered medications on file prior to visit.     The following portions of the patient's history were reviewed and updated as appropriate: allergies, current medications, past family history, past medical history, past social history, past surgical history and problem list.  ROS Otherwise as in subjective above     Objective: BP 120/70   Pulse 68   Wt 166 lb 3.2 oz (75.4 kg)   BMI 30.40 kg/m   Wt Readings from Last 3 Encounters:  07/13/23 166 lb 3.2 oz (75.4 kg)  06/03/23 166 lb 9.6 oz (75.6 kg)  04/14/23 161 lb 6.4 oz (73.2 kg)   General appearance: alert, no distress, well developed, well nourished Skin: spider veins left popliteal area Neck: supple, no lymphadenopathy, no thyromegaly, no masses Heart: RRR, normal S1, S2, no murmurs Lungs: CTA bilaterally, no wheezes, rhonchi, or rales Pulses: 2+ radial pulses, 2+ pedal pulses, normal cap refill Ext: no edema  Diabetic Foot Exam - Simple   Simple Foot Form Diabetic Foot exam was performed with the following findings: Yes 07/13/2023 10:37 AM  Visual Inspection See comments: Yes Sensation Testing Intact to touch and monofilament testing bilaterally: Yes Pulse Check See comments: Yes Comments 1+ pedal pulses, spider veins present bilat      Assessment: Encounter Diagnoses  Name Primary?   Type 2 diabetes mellitus with other specified  complication, with long-term current use of insulin (HCC) Yes   Vaccine counseling    Vitamin D deficiency    Dyslipidemia associated with type 2 diabetes mellitus (HCC)    Numbness and tingling of both feet       Plan: Diabetes  Update labs next week Continue metformin 500mg  1 tablet am, 1/2 tablet pm, lantus 40u daily Continue Lisinopril 5mg  daily for renal protection Continue glucose monitoring Continue daily foot check  Microalbumin normal 10/2022  Leg pain and burning, likely diabetic neuropathy -ABIs 12/2020 normal. Consider ABIs.  Begin trial of gabapentin 100mg  daily QHS.  Dyslipidemia - continue Crestor 20mg  and aspirin 81 mg daily.  Osteoporosis - we will pursue updated bone density test.  Not currently on medication.    Return next week fasting for labs.    Jerzey was seen today for medical management of chronic issues.  Diagnoses and all orders for this visit:  Type 2 diabetes mellitus with other specified complication, with long-term current use of insulin (HCC) -     Hemoglobin A1c; Future -     Urinalysis, Routine w reflex microscopic; Future  Vaccine counseling  Vitamin D deficiency -     VITAMIN D 25 Hydroxy (Vit-D Deficiency, Fractures); Future  Dyslipidemia associated with type 2 diabetes mellitus (HCC) -     Lipid panel; Future  Numbness and tingling of both feet -     Urinalysis, Routine w reflex microscopic; Future -     Vitamin B12; Future -     VITAMIN D 25 Hydroxy (Vit-D Deficiency, Fractures); Future  Other orders -     gabapentin (NEURONTIN) 100 MG capsule; Take 1 capsule (100 mg total) by mouth at bedtime. -     aspirin EC 81 MG tablet; Take 1 tablet (81 mg total) by mouth daily.    Follow up: next week for fasting labs

## 2023-07-13 NOTE — Patient Instructions (Signed)
Please call to schedule your bone density test   [facility]  [phone] [address], [address] [place], [place] [address]   

## 2023-07-21 ENCOUNTER — Other Ambulatory Visit (INDEPENDENT_AMBULATORY_CARE_PROVIDER_SITE_OTHER): Payer: Medicare HMO

## 2023-07-21 DIAGNOSIS — E559 Vitamin D deficiency, unspecified: Secondary | ICD-10-CM | POA: Diagnosis not present

## 2023-07-21 DIAGNOSIS — R202 Paresthesia of skin: Secondary | ICD-10-CM

## 2023-07-21 DIAGNOSIS — Z23 Encounter for immunization: Secondary | ICD-10-CM

## 2023-07-21 DIAGNOSIS — E1169 Type 2 diabetes mellitus with other specified complication: Secondary | ICD-10-CM

## 2023-07-21 DIAGNOSIS — R200 Anesthesia of skin: Secondary | ICD-10-CM

## 2023-07-21 DIAGNOSIS — Z794 Long term (current) use of insulin: Secondary | ICD-10-CM | POA: Diagnosis not present

## 2023-07-21 DIAGNOSIS — E785 Hyperlipidemia, unspecified: Secondary | ICD-10-CM | POA: Diagnosis not present

## 2023-07-21 LAB — LIPID PANEL

## 2023-07-22 ENCOUNTER — Other Ambulatory Visit: Payer: Self-pay | Admitting: Medical

## 2023-07-22 LAB — LIPID PANEL
Chol/HDL Ratio: 2.4 ratio (ref 0.0–4.4)
Cholesterol, Total: 127 mg/dL (ref 100–199)
HDL: 52 mg/dL (ref >39)
LDL Chol Calc (NIH): 52 mg/dL (ref 0–99)
Triglycerides: 132 mg/dL (ref 0–149)
VLDL Cholesterol Cal: 23 mg/dL (ref 5–40)

## 2023-07-22 LAB — URINALYSIS, ROUTINE W REFLEX MICROSCOPIC
Bilirubin, UA: NEGATIVE
Glucose, UA: NEGATIVE
Ketones, UA: NEGATIVE
Leukocytes,UA: NEGATIVE
Nitrite, UA: NEGATIVE
Protein,UA: NEGATIVE
RBC, UA: NEGATIVE
Specific Gravity, UA: 1.016 (ref 1.005–1.030)
Urobilinogen, Ur: 0.2 mg/dL (ref 0.2–1.0)
pH, UA: 6.5 (ref 5.0–7.5)

## 2023-07-22 LAB — HEMOGLOBIN A1C
Est. average glucose Bld gHb Est-mCnc: 180 mg/dL
Hgb A1c MFr Bld: 7.9 % — ABNORMAL HIGH (ref 4.8–5.6)

## 2023-07-22 LAB — VITAMIN B12: Vitamin B-12: 388 pg/mL (ref 232–1245)

## 2023-07-22 LAB — VITAMIN D 25 HYDROXY (VIT D DEFICIENCY, FRACTURES): Vit D, 25-Hydroxy: 45.3 ng/mL (ref 30.0–100.0)

## 2023-07-22 NOTE — Progress Notes (Signed)
Vitamin D looks good, vitamin B12 is good, cholesterol good, diabetes marker 7.9%.  Continue your current metformin and Lantus.  Since we did not tolerate SGLT2 such as Marcelline Deist, it may be worth a trial of something like Mounjaro weekly injection that might help get the sugars down little bit better  My understanding is that Medicare covers 1 of these types of medicines now.  If agreeable we can see what insurance will cover and start this weekly

## 2023-07-26 ENCOUNTER — Other Ambulatory Visit: Payer: Self-pay | Admitting: Medical

## 2023-07-26 MED ORDER — MOUNJARO 2.5 MG/0.5ML ~~LOC~~ SOAJ: 2.5000 mg | SUBCUTANEOUS | 0 refills | Status: DC

## 2023-07-26 MED ORDER — MOUNJARO 5 MG/0.5ML ~~LOC~~ SOAJ: 5.0000 mg | SUBCUTANEOUS | 1 refills | Status: DC

## 2023-07-27 ENCOUNTER — Telehealth: Payer: Self-pay | Admitting: Medical

## 2023-07-27 NOTE — Telephone Encounter (Signed)
Tried to call pt

## 2023-07-27 NOTE — Telephone Encounter (Signed)
Pt will come by and pick up 1 sample box of linzess 290mg 

## 2023-07-27 NOTE — Telephone Encounter (Signed)
Pt requesting Linzess samples until her order arrives

## 2023-08-09 ENCOUNTER — Telehealth: Payer: Self-pay | Admitting: Medical

## 2023-08-09 DIAGNOSIS — K5900 Constipation, unspecified: Secondary | ICD-10-CM

## 2023-08-09 DIAGNOSIS — Z794 Long term (current) use of insulin: Secondary | ICD-10-CM

## 2023-08-09 NOTE — Telephone Encounter (Signed)
Pt left message needs new application from [employer] for PT [facility]IST[facility] LINZESS please call her and she also get PAP for other medications

## 2023-08-10 NOTE — Telephone Encounter (Signed)
Will prepare PAP for linzess for patient pickup tmrw.  Patient did pick up [facility] from pharmacy and has started. Is concerned about continuing at cost though at $47, would qualify for ozempic PAP for free, would you be okay with that change?

## 2023-08-23 ENCOUNTER — Ambulatory Visit (INDEPENDENT_AMBULATORY_CARE_PROVIDER_SITE_OTHER): Payer: Medicare HMO | Admitting: Medical

## 2023-08-23 VITALS — BP 118/72 | HR 64 | Temp 97.8°F | Wt 169.0 lb

## 2023-08-23 DIAGNOSIS — M79605 Pain in left leg: Secondary | ICD-10-CM | POA: Diagnosis not present

## 2023-08-23 DIAGNOSIS — M5442 Lumbago with sciatica, left side: Secondary | ICD-10-CM

## 2023-08-23 DIAGNOSIS — M5432 Sciatica, left side: Secondary | ICD-10-CM

## 2023-08-23 MED ORDER — TIZANIDINE HCL 4 MG PO TABS
4.0000 mg | ORAL_TABLET | Freq: Every day | ORAL | 0 refills | Status: DC
Start: 2023-08-23 — End: 2023-09-07

## 2023-08-23 MED ORDER — HYDROCODONE-ACETAMINOPHEN 5-325 MG PO TABS: 1.0000 | ORAL_TABLET | Freq: Four times a day (QID) | ORAL | 0 refills | Status: DC | PRN

## 2023-08-23 NOTE — Progress Notes (Signed)
Subjective:  Marissa Oliver is a 82 y.o. female who presents for Chief Complaint  Patient presents with   Leg Pain    Extends the outer length of her left leg. Since this weekend, started with lower back. Burning pain in knee. Tried elevation, heat, and ice.      Here for pain in left leg, hip, lateral leg, knee and lower leg for the past several days.  No new numbness or tingling.  Has some back pain in the left lower back.  No recent strenuous activity.  No fall injury or trauma.  No fever.  No changes in urine or bowels.  Using Tylenol but is not helping.  no other aggravating or relieving factors.    No other c/o.  The following portions of the patient's history were reviewed and updated as appropriate: allergies, current medications, past family history, past medical history, past social history, past surgical history and problem list.  ROS Otherwise as in subjective above  Objective: BP 118/72   Pulse 64   Temp 97.8 F (36.6 C) (Tympanic)   Wt 169 lb (76.7 kg)   SpO2 96%   BMI 30.91 kg/m   General appearance: alert, no distress, well developed, well nourished Tender left lumbar paraspinal region but not midline.  Otherwise back nontender.  Mild pain in the buttock and low back on the left with range of motion which is about 60% of normal with flexion and extension Legs nontender without deformity or swelling or tenderness Legs neurovascularly intact, negative straight leg raise, normal heel and toe walk    Assessment: Encounter Diagnoses  Name Primary?   Sciatica of left side Yes   Left leg pain    Acute left-sided low back pain with left-sided sciatica      Plan: We discussed symptoms and exam findings suggesting a mild sciatica flare.  Can use medications below but caution due to sedation.  Advised that symptoms should resolve over the next 5 to 7 days  We discussed the following recommendations  Patient Instructions  Your symptoms suggesting sciatica  flare  Recommendations: Use rest the next few days, no heavy lifting or strenuous activity Stretch particularly for the back and the legs the next few days You can use either Tylenol over-the-counter as needed once or twice a day or you can use the hydrocodone pain medicine I sent to the pharmacy today.  This medicine can make you sleepy but is more effective for pain control short-term I also prescribed a muscle laxer tizanidine you can use at bedtime or possibly up to twice a day for muscle spasm or tension in the low back or leg I expect your symptoms to gradually improve over the next 5 days.  If not or much worse over the next 5 days then call or recheck   Sciatica  Sciatica is pain, weakness, tingling, or loss of feeling (numbness) along the sciatic nerve. The sciatic nerve starts in the lower back and goes down the back of each leg. Sciatica usually affects one side of the body. Sciatica usually goes away on its own or with treatment. Sometimes, sciatica may come back. What are the causes? This condition happens when the sciatic nerve is pinched or has pressure put on it. This may be caused by: A disk in between the bones of the spine bulging out too far (herniated disk). Changes in the spinal disks due to aging. A condition that affects a muscle in the butt. Extra bone growth near  the sciatic nerve. A break (fracture) of the area between your hip bones (pelvis). Pregnancy. Tumor. This is rare. What increases the risk? You are more likely to develop this condition if you: Play sports that put pressure or stress on the spine. Have poor strength and ease of movement (flexibility). Have had a back injury or back surgery. Sit for long periods of time. Do activities that involve bending or lifting over and over again. Are very overweight (obese). What are the signs or symptoms? Symptoms can vary from mild to very bad. They may include: Any of these problems in the lower back, leg,  hip, or butt: Mild tingling, loss of feeling, or dull aches. A burning feeling. Sharp pains. Loss of feeling in the back of the calf or the sole of the foot. Leg weakness. Very bad back pain that makes it hard to move. These symptoms may get worse when you cough, sneeze, or laugh. They may also get worse when you sit or stand for long periods of time. How is this treated? This condition often gets better without any treatment. However, treatment may include: Changing or cutting back on physical activity when you have pain. Exercising, including strengthening and stretching. Putting ice or heat on the affected area. Shots of medicines to relieve pain and swelling or to relax your muscles. Surgery. Follow these instructions at home: Medicines Take over-the-counter and prescription medicines only as told by your doctor. Ask your doctor if you should avoid driving or using machines while you are taking your medicine. Managing pain     If told, put ice on the affected area. To do this: Put ice in a plastic bag. Place a towel between your skin and the bag. Leave the ice on for 20 minutes, 2-3 times a day. If your skin turns bright red, take off the ice right away to prevent skin damage. The risk of skin damage is higher if you cannot feel pain, heat, or cold. If told, put heat on the affected area. Do this as often as told by your doctor. Use the heat source that your doctor tells you to use, such as a moist heat pack or a heating pad. Place a towel between your skin and the heat source. Leave the heat on for 20-30 minutes. If your skin turns bright red, take off the heat right away to prevent burns. The risk of burns is higher if you cannot feel pain, heat, or cold. Activity  Return to your normal activities when your doctor says that it is safe. Avoid activities that make your symptoms worse. Take short rests during the day. When you rest for a long time, do some physical activity or  stretching between periods of rest. Avoid sitting for a long time without moving. Get up and move around at least one time each hour. Do exercises and stretches as told by your doctor. Do not lift anything that is heavier than 10 lb (4.5 kg). Avoid lifting heavy things even when you do not have symptoms. Avoid lifting heavy things over and over. When you lift objects, always lift in a way that is safe for your body. To do this, you should: Bend your knees. Keep the object close to your body. Avoid twisting. General instructions Stay at a healthy weight. Wear comfortable shoes that support your feet. Avoid wearing high heels. Avoid sleeping on a mattress that is too soft or too hard. You might have less pain if you sleep on a mattress that  is firm enough to support your back. Contact a doctor if: Your pain is not controlled by medicine. Your pain does not get better. Your pain gets worse. Your pain lasts longer than 4 weeks. You lose weight without trying. Get help right away if: You cannot control when you pee (urinate) or poop (have a bowel movement). You have weakness in any of these areas and it gets worse: Lower back. The area between your hip bones. Butt. Legs. You have redness or swelling of your back. You have a burning feeling when you pee. Summary Sciatica is pain, weakness, tingling, or loss of feeling (numbness) along the sciatic nerve. This may include the lower back, legs, hips, and butt. This condition happens when the sciatic nerve is pinched or has pressure put on it. Treatment often includes rest, exercise, medicines, and putting ice or heat on the affected area. This information is not intended to replace advice given to you by your health care provider. Make sure you discuss any questions you have with your health care provider. Document Revised: 01/25/2022 Document Reviewed: 01/25/2022 Elsevier Patient Education  2024 [employer]     Emaleigh was seen today  for leg pain.  Diagnoses and all orders for this visit:  Sciatica of left side  Left leg pain  Acute left-sided low back pain with left-sided sciatica  Other orders -     HYDROcodone-acetaminophen (NORCO) 5-325 MG tablet; Take 1 tablet by mouth every 6 (six) hours as needed. -     tiZANidine (ZANAFLEX) 4 MG tablet; Take 1 tablet (4 mg total) by mouth at bedtime.    Follow up: prn

## 2023-08-23 NOTE — Patient Instructions (Signed)
Your symptoms suggesting sciatica flare  Recommendations: Use rest the next few days, no heavy lifting or strenuous activity Stretch particularly for the back and the legs the next few days You can use either Tylenol over-the-counter as needed once or twice a day or you can use the hydrocodone pain medicine I sent to the pharmacy today.  This medicine can make you sleepy but is more effective for pain control short-term I also prescribed a muscle laxer tizanidine you can use at bedtime or possibly up to twice a day for muscle spasm or tension in the low back or leg I expect your symptoms to gradually improve over the next 5 days.  If not or much worse over the next 5 days then call or recheck   Sciatica  Sciatica is pain, weakness, tingling, or loss of feeling (numbness) along the sciatic nerve. The sciatic nerve starts in the lower back and goes down the back of each leg. Sciatica usually affects one side of the body. Sciatica usually goes away on its own or with treatment. Sometimes, sciatica may come back. What are the causes? This condition happens when the sciatic nerve is pinched or has pressure put on it. This may be caused by: A disk in between the bones of the spine bulging out too far (herniated disk). Changes in the spinal disks due to aging. A condition that affects a muscle in the butt. Extra bone growth near the sciatic nerve. A break (fracture) of the area between your hip bones (pelvis). Pregnancy. Tumor. This is rare. What increases the risk? You are more likely to develop this condition if you: Play sports that put pressure or stress on the spine. Have poor strength and ease of movement (flexibility). Have had a back injury or back surgery. Sit for long periods of time. Do activities that involve bending or lifting over and over again. Are very overweight (obese). What are the signs or symptoms? Symptoms can vary from mild to very bad. They may include: Any of these  problems in the lower back, leg, hip, or butt: Mild tingling, loss of feeling, or dull aches. A burning feeling. Sharp pains. Loss of feeling in the back of the calf or the sole of the foot. Leg weakness. Very bad back pain that makes it hard to move. These symptoms may get worse when you cough, sneeze, or laugh. They may also get worse when you sit or stand for long periods of time. How is this treated? This condition often gets better without any treatment. However, treatment may include: Changing or cutting back on physical activity when you have pain. Exercising, including strengthening and stretching. Putting ice or heat on the affected area. Shots of medicines to relieve pain and swelling or to relax your muscles. Surgery. Follow these instructions at home: Medicines Take over-the-counter and prescription medicines only as told by your doctor. Ask your doctor if you should avoid driving or using machines while you are taking your medicine. Managing pain     If told, put ice on the affected area. To do this: Put ice in a plastic bag. Place a towel between your skin and the bag. Leave the ice on for 20 minutes, 2-3 times a day. If your skin turns bright red, take off the ice right away to prevent skin damage. The risk of skin damage is higher if you cannot feel pain, heat, or cold. If told, put heat on the affected area. Do this as often as told by  your doctor. Use the heat source that your doctor tells you to use, such as a moist heat pack or a heating pad. Place a towel between your skin and the heat source. Leave the heat on for 20-30 minutes. If your skin turns bright red, take off the heat right away to prevent burns. The risk of burns is higher if you cannot feel pain, heat, or cold. Activity  Return to your normal activities when your doctor says that it is safe. Avoid activities that make your symptoms worse. Take short rests during the day. When you rest for a long  time, do some physical activity or stretching between periods of rest. Avoid sitting for a long time without moving. Get up and move around at least one time each hour. Do exercises and stretches as told by your doctor. Do not lift anything that is heavier than 10 lb (4.5 kg). Avoid lifting heavy things even when you do not have symptoms. Avoid lifting heavy things over and over. When you lift objects, always lift in a way that is safe for your body. To do this, you should: Bend your knees. Keep the object close to your body. Avoid twisting. General instructions Stay at a healthy weight. Wear comfortable shoes that support your feet. Avoid wearing high heels. Avoid sleeping on a mattress that is too soft or too hard. You might have less pain if you sleep on a mattress that is firm enough to support your back. Contact a doctor if: Your pain is not controlled by medicine. Your pain does not get better. Your pain gets worse. Your pain lasts longer than 4 weeks. You lose weight without trying. Get help right away if: You cannot control when you pee (urinate) or poop (have a bowel movement). You have weakness in any of these areas and it gets worse: Lower back. The area between your hip bones. Butt. Legs. You have redness or swelling of your back. You have a burning feeling when you pee. Summary Sciatica is pain, weakness, tingling, or loss of feeling (numbness) along the sciatic nerve. This may include the lower back, legs, hips, and butt. This condition happens when the sciatic nerve is pinched or has pressure put on it. Treatment often includes rest, exercise, medicines, and putting ice or heat on the affected area. This information is not intended to replace advice given to you by your health care provider. Make sure you discuss any questions you have with your health care provider. Document Revised: 01/25/2022 Document Reviewed: 01/25/2022 Elsevier Patient Education  2024 [employer].

## 2023-08-28 ENCOUNTER — Ambulatory Visit (HOSPITAL_COMMUNITY)
Admission: RE | Admit: 2023-08-28 | Discharge: 2023-08-28 | Disposition: A | Discharge status: Home / Self Care | Payer: Medicare HMO | Source: Ambulatory Visit | Attending: Family Medicine | Admitting: Family Medicine

## 2023-08-28 ENCOUNTER — Ambulatory Visit (HOSPITAL_COMMUNITY): Payer: Medicare HMO

## 2023-08-28 ENCOUNTER — Ambulatory Visit (INDEPENDENT_AMBULATORY_CARE_PROVIDER_SITE_OTHER): Payer: Medicare HMO

## 2023-08-28 ENCOUNTER — Encounter (HOSPITAL_COMMUNITY): Payer: Self-pay

## 2023-08-28 ENCOUNTER — Telehealth (HOSPITAL_COMMUNITY): Payer: Self-pay | Admitting: Family Medicine

## 2023-08-28 VITALS — BP 138/75 | HR 67 | Temp 97.5°F | Resp 18 | Ht 62.0 in | Wt 169.1 lb

## 2023-08-28 DIAGNOSIS — M5442 Lumbago with sciatica, left side: Secondary | ICD-10-CM | POA: Diagnosis not present

## 2023-08-28 DIAGNOSIS — M25552 Pain in left hip: Secondary | ICD-10-CM

## 2023-08-28 DIAGNOSIS — M5136 Other intervertebral disc degeneration, lumbar region with discogenic back pain only: Secondary | ICD-10-CM | POA: Diagnosis not present

## 2023-08-28 DIAGNOSIS — M545 Low back pain, unspecified: Secondary | ICD-10-CM | POA: Diagnosis not present

## 2023-08-28 NOTE — Discharge Instructions (Addendum)
It was nice seeing you today. Your lumbar spine and hip xrays are still pending. I will call you back soon as I have your results. Please continue your pain regimen as tolerated and as recommended by your orthopedics. Consider PT evaluation soon. Follow up with orthopedic as planned

## 2023-08-28 NOTE — Telephone Encounter (Signed)
I have discussed xray report with her. F/U orthopedic as planned. She requested information to her MyChart account.  DG Lumbar Spine Complete  Result Date: 08/28/2023 CLINICAL DATA:  Chronic low back pain. EXAM: LUMBAR SPINE - COMPLETE 4+ VIEW COMPARISON:  CT abdomen and pelvis dated 10/22/2011. FINDINGS: There is no evidence of lumbar spine fracture. Alignment is normal. 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 Electronically Signed   By: Romona Curls M.D.   On: 08/28/2023 12:28   DG Pelvis 1-2 Views  Result Date: 08/28/2023 CLINICAL DATA:  Low back pain and LEFT hip pain EXAM: PELVIS - 1-2 VIEW COMPARISON:  Pelvic radiograph 02/07/2022 FINDINGS: No fracture or dislocation of the pelvis. Bowel gas overlies the pubic symphysis. Sclerosis and chronic findings of the pubic symphysis is not changed from comparison radiograph. IMPRESSION: No acute findings in the pelvis. Electronically Signed   By: Genevive Bi M.D.   On: 08/28/2023 12:19

## 2023-08-28 NOTE — ED Triage Notes (Addendum)
"  Went to Dr on Marissa Oliver thinks it is a sciatic nerve pain not getting any better even with meds prescribe. - Entered by patient"  Onset over last weekend. Pain shooting down into the left leg causing the knee to throb. Meds not helping (Hydrocodone and muscle relaxer).

## 2023-08-28 NOTE — ED Provider Notes (Signed)
[facility]    CSN: [id number] Arrival date & time: 08/28/23  1048      History   Chief Complaint Chief Complaint  Patient presents with   Back Pain   Appointment    HPI Marissa Oliver is a 82 y.o. female.   The history is provided by the patient. No language interpreter was used.  Back Pain Location:  Lumbar spine Quality:  Aching Radiates to:  L knee and L thigh Pain severity:  Mild (Pain now is about 2/10 in severity, but could be more severe) Pain is:  Worse during the night Onset quality:  Gradual Duration:  7 days Timing:  Intermittent Progression:  Waxing and waning Chronicity:  Recurrent Context: not falling, not lifting heavy objects, not recent injury and not twisting   Worsened by:  Nothing Ineffective treatments: On Gabapentin qhs for nerve pain and was started on Mscle relaxant and oxycodone for pain which makes her drowsy. Associated symptoms: no bladder incontinence, no bowel incontinence, no fever, no numbness, no paresthesias and no weakness   Hip Pain This is a new (C/O left hip pain ongoing for a few days associated with lumbar pain. She has had chronic back pain in the past, but this hip pain association is different from her previous pain) problem. The problem has been gradually improving. Nothing aggravates the symptoms. Nothing relieves the symptoms.    Past Medical History:  Diagnosis Date   Atrophic vaginitis    estrace cream rx'd by Dr. Vernie Ammons   Colon polyp    tubular adenoma (colonoscopy q5 yrs)   Diabetes mellitus 2005   Diverticulosis    Dyslipidemia    GAD (generalized anxiety disorder)    Hemorrhoids    Hyperlipidemia    OAB (overactive bladder)    Osteoporosis    Psoriasis    mild, sees [facility]    Patient Active Problem List   Diagnosis Date Noted   Medication management 04/14/2023   Vitamin B6 deficiency 04/14/2023   Spider veins 04/14/2023   Acute UTI 01/16/2023   Chronic constipation 01/10/2023    Groin pain, right 02/24/2022   Pain of right hip 02/24/2022   Pain due to onychomycosis of toenails of both feet 10/05/2021   Enlarged and hypertrophic nails 07/20/2021   Needs flu shot 07/20/2021   Advanced directives, counseling/discussion 04/15/2021   PAD (peripheral artery disease) (HCC) 04/15/2021   Encounter for health maintenance examination in adult 04/15/2021   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   Chronic idiopathic constipation 12/17/2020   Diverticulosis of colon 12/17/2020   Obesity 12/17/2020   Need for pneumococcal vaccination 12/02/2020   Hearing loss 12/02/2020   Estrogen deficiency 12/02/2020   Post-menopausal 12/02/2020   Encounter for screening for vascular disease 12/02/2020   Screening for heart disease 12/02/2020   History of recurrent UTIs 11/13/2020   Dyslipidemia associated with type 2 diabetes mellitus (HCC) 07/09/2020   Senile purpura (HCC) 04/07/2020   Insomnia 04/07/2020   Psoriasis 04/07/2020   Decreased pulses in feet 12/19/2018   Former smoker 12/09/2016   History of colonic polyps 04/29/2016   Medicare annual wellness visit, subsequent 04/29/2016   Screening for cancer 04/29/2016   Vaccine counseling 04/29/2016   Type 2 diabetes mellitus with other specified complication (HCC) 03/04/2016   Need for influenza vaccination 08/04/2015   Vitamin D deficiency 10/15/2014   Atrophic vaginitis 01/29/2014   Urinary incontinence 01/29/2014   Generalized anxiety disorder 01/29/2014   Overactive bladder 01/29/2014   Osteoporosis 08/12/2011  Past Surgical History:  Procedure Laterality Date   ABDOMINAL HYSTERECTOMY  11/01/1970   partial   APPENDECTOMY     CARDIOVASCULAR STRESS TEST  12/25/2004   EF 65%. OVERALL NORMAL STRESS CARDIOLITE. NO EVIDENCE  OF ISCHEMIA   COLONOSCOPY  08/01/2010   cologuard 01/2016 normal   CYSTOSCOPY  03/2023   At [facility] urology   PUBOVAGINAL SLING  04/01/1998   TUBAL LIGATION      OB History     Gravida  3    Para  3   Term      Preterm      AB      Living  3      SAB      IAB      Ectopic      Multiple      Live Births               Home Medications    Prior to Admission medications   Medication Sig Start Date End Date Taking? Authorizing Provider  aspirin EC 81 MG tablet Take 1 tablet (81 mg total) by mouth daily. 07/13/23  Yes Tysinger, Kermit Balo, PA-C  Fiber POWD Take 1 Dose by mouth as needed.   Yes [provider]  gabapentin (NEURONTIN) 100 MG capsule Take 1 capsule (100 mg total) by mouth at bedtime. 07/13/23  Yes Tysinger, Kermit Balo, PA-C  HYDROcodone-acetaminophen (NORCO) 5-325 MG tablet Take 1 tablet by mouth every 6 (six) hours as needed. 08/23/23  Yes Tysinger, Kermit Balo, PA-C  insulin glargine (LANTUS) 100 unit/mL SOPN Inject 55 Units into the skin daily. 04/24/23  Yes Tysinger, Kermit Balo, PA-C  Insulin Pen Needle (B-D UF III MINI PEN NEEDLES) 31G X 5 MM MISC USE ONCE DAILY 01/11/23  Yes Tysinger, Kermit Balo, PA-C  LINZESS 290 MCG CAPS capsule Take 1 capsule (290 mcg total) by mouth daily before breakfast. Take 1, Every morning before breakfast 08/25/22  Yes Meredith Pel, NP  lisinopril (ZESTRIL) 5 MG tablet TAKE 1 TABLET BY MOUTH DAILY 07/11/23  Yes Tysinger, Kermit Balo, PA-C  metFORMIN (GLUCOPHAGE) 500 MG tablet 500mg  daily in morning, 1/2 tablet or 250mg  QHS 04/15/23  Yes Tysinger, Kermit Balo, PA-C  omeprazole (PRILOSEC) 40 MG capsule Take 1 capsule (40 mg total) by mouth every morning 09/14/22  Yes Meredith Pel, NP  oxybutynin (DITROPAN-XL) 5 MG 24 hr tablet Take 1 tablet (5 mg total) by mouth at bedtime. 04/15/23  Yes Tysinger, Kermit Balo, PA-C  polyethylene glycol powder (GLYCOLAX/MIRALAX) 17 GM/SCOOP powder Take 17 g by mouth daily.   Yes [provider]  rosuvastatin (CRESTOR) 20 MG tablet Take 1 tablet (20 mg total) by mouth daily. 04/15/23  Yes Tysinger, Kermit Balo, PA-C  tiZANidine (ZANAFLEX) 4 MG tablet Take 1 tablet (4 mg total) by mouth at bedtime.  08/23/23  Yes Tysinger, Kermit Balo, PA-C  vitamin B-6 (PYRIDOXINE) 25 MG tablet Take 1 tablet (25 mg total) by mouth daily. 04/20/21  Yes Tysinger, Kermit Balo, PA-C  Blood Glucose Monitoring Suppl [facility] VERIO) w/Device KIT Test twice a day 01/11/23   Tysinger, Kermit Balo, PA-C  glucose blood [facility] ULTRA TEST) test strip Use as instructed 07/12/23   Tysinger, Kermit Balo, PA-C  OneTouch Delica Lancets 33G MISC 1 each by Does not apply route in the morning and at bedtime. Dx:E11.9 01/10/23   Tysinger, Kermit Balo, PA-C  tirzepatide [facility]) 2.5 MG/0.5ML Pen Inject 2.5 mg into the skin once a week. 07/26/23  Tysinger, Kermit Balo, PA-C  tirzepatide [facility]) 5 MG/0.5ML Pen Inject 5 mg into the skin once a week. 07/26/23   Tysinger, Kermit Balo, PA-C    Family History Family History  Problem Relation Age of Onset   Arthritis Mother    Heart failure Mother    Mental illness Father    Kidney disease Father    Stroke Father    Heart attack Father    Macular degeneration Father    Heart disease Brother    Prostate cancer Brother    Stroke Paternal Grandmother    Stroke Maternal Aunt    Cancer Maternal Uncle     Social History Social History   Tobacco Use   Smoking status: Former    Current packs/day: 0.00    Types: Cigarettes    Quit date: 05/22/2012    Years since quitting: 11.2   Smokeless tobacco: Never  Vaping Use   Vaping status: Never Used  Substance Use Topics   Alcohol use: No    Alcohol/week: 0.0 standard drinks of alcohol   Drug use: No     Allergies   Farxiga [dapagliflozin]   Review of Systems Review of Systems  Constitutional:  Negative for fever.  Gastrointestinal:  Negative for bowel incontinence.  Genitourinary:  Negative for bladder incontinence.  Musculoskeletal:  Positive for back pain.  Neurological:  Negative for weakness, numbness and paresthesias.  All other systems reviewed and are negative.    Physical Exam Triage Vital Signs ED Triage Vitals  Encounter  Vitals Group     BP 08/28/23 1108 138/75     Systolic BP Percentile --      Diastolic BP Percentile --      Pulse Rate 08/28/23 1108 67     Resp 08/28/23 1108 18     Temp 08/28/23 1108 (!) 97.5 F (36.4 C)     Temp Source 08/28/23 1108 Oral     SpO2 08/28/23 1108 96 %     Weight 08/28/23 1107 169 lb 1.5 oz (76.7 kg)     Height 08/28/23 1107 5\' 2"  (1.575 m)     Head Circumference --      Peak Flow --      Pain Score 08/28/23 1107 10     Pain Loc --      Pain Education --      Exclude from Growth Chart --    No data found.  Updated Vital Signs BP 138/75 (BP Location: Left Arm)   Pulse 67   Temp (!) 97.5 F (36.4 C) (Oral)   Resp 18   Ht 5\' 2"  (1.575 m)   Wt 76.7 kg   SpO2 96%   BMI 30.93 kg/m   Visual Acuity Right Eye Distance:   Left Eye Distance:   Bilateral Distance:    Right Eye Near:   Left Eye Near:    Bilateral Near:     Physical Exam Vitals and nursing note reviewed.  Cardiovascular:     Rate and Rhythm: Normal rate and regular rhythm.     Heart sounds: Normal heart sounds. No murmur heard. Pulmonary:     Effort: Pulmonary effort is normal. No respiratory distress.     Breath sounds: Normal breath sounds. No wheezing.  Musculoskeletal:     Lumbar back: Tenderness present. No deformity. Decreased range of motion.     Right hip: No tenderness. Decreased range of motion.     Left hip: Tenderness present. Normal range of motion.  Comments: Moderate tenderness of her left paraspinal joint  Neurological:     General: No focal deficit present.     Mental Status: She is oriented to person, place, and time.     Sensory: No sensory deficit.      UC Treatments / Results  Labs (all labs ordered are listed, but only abnormal results are displayed) Labs Reviewed - No data to display  EKG   Radiology No results found.  Procedures Procedures (including critical care time)  Medications Ordered in UC Medications - No data to display  Initial  Impression / Assessment and Plan / UC Course  I have reviewed the triage vital signs and the nursing notes.  Pertinent labs & imaging results that were available during my care of the patient were reviewed by me and considered in my medical decision making (see chart for details).  Clinical Course as of 08/28/23 1214  Sun Aug 28, 2023  1132 Lumbar spine and hip pain Lumbar and pelvic xrays pending Plan to call her with the test result. Pelvic xray showed Continue home pain regimen per orthopedic F/U orthopedic as planned Consider PT referral May benefit from an MRI of her spin and pelvis in the future if there is a poor response to conservative measures [KE]    Clinical Course User Index [KE] Doreene Eland, MD    Final Clinical Impressions(s) / UC Diagnoses   Final diagnoses:  Left-sided low back pain with left-sided sciatica, unspecified chronicity  Left hip pain     Discharge Instructions      It was nice seeing you today. Your lumbar spine and hip xrays are still pending. I will call you back soon as I have your results. Please continue your pain regimen as tolerated and as recommended by your orthopedics. Consider PT evaluation soon. Follow up with orthopedic as planned     ED Prescriptions   None    PDMP not reviewed this encounter.   Doreene Eland, MD 08/28/23 1215

## 2023-09-07 ENCOUNTER — Telehealth: Payer: Self-pay | Admitting: Medical

## 2023-09-07 ENCOUNTER — Other Ambulatory Visit: Payer: Self-pay | Admitting: Medical

## 2023-09-07 MED ORDER — TIZANIDINE HCL 4 MG PO TABS: 4.0000 mg | ORAL_TABLET | Freq: Every day | ORAL | 0 refills | Status: DC

## 2023-09-07 MED ORDER — GABAPENTIN 100 MG PO CAPS: 100.0000 mg | ORAL_CAPSULE | Freq: Every day | ORAL | 1 refills | Status: DC

## 2023-09-07 NOTE — Telephone Encounter (Signed)
Pt states she hadn't heard back from the [place] message she had sent Vincenza Hews, I advised Vincenza Hews did review & gave her the info from [place], she said that pain comes & goes & muscle relaxer did help, she would like a refill on the Gabapentin & muscle relaxer & she is going to hold off for now on the Physical therapy & will call back for appt if gets worse.

## 2023-09-08 ENCOUNTER — Ambulatory Visit: Payer: Medicare HMO

## 2023-09-08 NOTE — Telephone Encounter (Signed)
Called patient in reference to receiving her call about being placed in any cancellation positions if come available. Left message notifying patient that there are no available appts and if she is having pain or discomfort to go to the Urgent care or to the ED. Patient has present appt with Ms. Wilmon Pali, [place] on 11/23/23 at 2:30.

## 2023-09-13 ENCOUNTER — Ambulatory Visit: Payer: Medicare HMO

## 2023-09-15 ENCOUNTER — Other Ambulatory Visit: Payer: Medicare HMO

## 2023-09-17 NOTE — Telephone Encounter (Signed)
done

## 2023-09-20 ENCOUNTER — Ambulatory Visit: Payer: Medicare HMO

## 2023-09-27 NOTE — Telephone Encounter (Signed)
I refaxed PAP

## 2023-10-05 ENCOUNTER — Other Ambulatory Visit: Payer: Self-pay | Admitting: Medical

## 2023-10-05 DIAGNOSIS — E118 Type 2 diabetes mellitus with unspecified complications: Secondary | ICD-10-CM

## 2023-10-06 ENCOUNTER — Telehealth: Payer: Self-pay

## 2023-10-06 ENCOUNTER — Telehealth: Payer: Self-pay | Admitting: Medical

## 2023-10-06 ENCOUNTER — Other Ambulatory Visit: Payer: Medicare HMO

## 2023-10-06 ENCOUNTER — Other Ambulatory Visit: Payer: Self-pay

## 2023-10-06 MED ORDER — BD PEN NEEDLE MINI U/F 31G X 5 MM MISC: 5 refills | Status: DC

## 2023-10-06 NOTE — Progress Notes (Signed)
   10/06/2023  Patient ID: Marissa Oliver, female   DOB: 01-08-1941, 82 y.o.   MRN: [id number]  Patient has been injecting Mounjaro and reports controlled sugars and says her only noticed side effect at this time is vivid dreams.  Patient has picked up her Ozempic PAP and plans to start on 12/15 (gives herself last Mounjaro injection on 12/8). Instructed to start at 0.5mg  and reminded of follow up with PCP in Jan.  Instructed to call with any questions or concerns.  Follow Up: PCP 1 month Pharmacist: 2 months  Sherrill Raring, PharmD Clinical Pharmacist [phone]

## 2023-10-06 NOTE — Telephone Encounter (Signed)
PAP Lantus received, pt informed

## 2023-10-07 MED ORDER — BD PEN NEEDLE MINI U/F 31G X 5 MM MISC: 5 refills | Status: DC

## 2023-11-07 ENCOUNTER — Telehealth: Payer: Self-pay | Admitting: Medical

## 2023-11-07 NOTE — Telephone Encounter (Signed)
 PT ASSISTANCE LINZESS 2025 approved, pt called & states they need a Rx for the year, this was completed & faxed to [facility]

## 2023-11-07 NOTE — Telephone Encounter (Signed)
 PAP LANTUS 2025 approved

## 2023-11-09 ENCOUNTER — Other Ambulatory Visit: Payer: Self-pay | Admitting: Medical

## 2023-11-14 ENCOUNTER — Ambulatory Visit: Payer: Medicare HMO | Admitting: Medical

## 2023-11-17 ENCOUNTER — Ambulatory Visit: Payer: Medicare HMO | Admitting: Medical

## 2023-11-17 VITALS — BP 122/80 | HR 67 | Wt 168.0 lb

## 2023-11-17 DIAGNOSIS — N3281 Overactive bladder: Secondary | ICD-10-CM | POA: Diagnosis not present

## 2023-11-17 DIAGNOSIS — E785 Hyperlipidemia, unspecified: Secondary | ICD-10-CM | POA: Diagnosis not present

## 2023-11-17 DIAGNOSIS — Z794 Long term (current) use of insulin: Secondary | ICD-10-CM | POA: Diagnosis not present

## 2023-11-17 DIAGNOSIS — K5909 Other constipation: Secondary | ICD-10-CM | POA: Diagnosis not present

## 2023-11-17 DIAGNOSIS — E1169 Type 2 diabetes mellitus with other specified complication: Secondary | ICD-10-CM | POA: Diagnosis not present

## 2023-11-17 NOTE — Progress Notes (Signed)
Subjective:  Marissa Oliver is a 83 y.o. female who presents for Chief Complaint  Patient presents with   Diabetes    Diabetes and kidney level     Here for routine med check.  Diabetes-compliant with Lantus 4 units daily and Ozempic 0.5 mg weekly.  Blood sugars usually run 97-120 but she did get a 68 reading and some other high 60 readings in the last month or so.  No polyuria, polydipsia, no vision change.  Hyperlipidemia-compliant with Crestor 20 mg daily without complaint and 81 mg aspirin daily  Has ongoing problems of constipation.  She does use Linzess daily.  She is seeing GI next week for follow-up  She is gabapentin daily at bedtime.  She is not sure the oxybutynin helps all that much with her bladder symptoms.  She is on a prophylactic antibiotic per urology  No other aggravating or relieving factors.    No other c/o.  Past Medical History:  Diagnosis Date   Atrophic vaginitis    estrace cream rx'd by Dr. Vernie Ammons   Colon polyp    tubular adenoma (colonoscopy q5 yrs)   Diabetes mellitus 2005   Diverticulosis    Dyslipidemia    GAD (generalized anxiety disorder)    Hemorrhoids    Hyperlipidemia    OAB (overactive bladder)    Osteoporosis    Psoriasis    mild, sees [facility]   Current Outpatient Medications on File Prior to Visit  Medication Sig Dispense Refill   aspirin EC 81 MG tablet Take 1 tablet (81 mg total) by mouth daily. 90 tablet 3   Fiber POWD Take 1 Dose by mouth as needed.     gabapentin (NEURONTIN) 100 MG capsule TAKE 1 CAPSULE BY MOUTH AT BEDTIME 90 capsule 1   insulin glargine (LANTUS) 100 unit/mL SOPN Inject 55 Units into the skin daily. 15 mL 5   LINZESS 290 MCG CAPS capsule Take 1 capsule (290 mcg total) by mouth daily before breakfast. Take 1, Every morning before breakfast 30 capsule 3   lisinopril (ZESTRIL) 5 MG tablet TAKE 1 TABLET BY MOUTH DAILY 90 tablet 0   omeprazole (PRILOSEC) 40 MG capsule Take 1 capsule (40 mg total) by  mouth every morning 90 capsule 3   oxybutynin (DITROPAN-XL) 5 MG 24 hr tablet Take 1 tablet (5 mg total) by mouth at bedtime. 90 tablet 3   polyethylene glycol powder (GLYCOLAX/MIRALAX) 17 GM/SCOOP powder Take 17 g by mouth daily.     rosuvastatin (CRESTOR) 20 MG tablet Take 1 tablet (20 mg total) by mouth daily. 90 tablet 3   Semaglutide,0.25 or 0.5MG /DOS, (OZEMPIC, 0.25 OR 0.5 MG/DOSE,) 2 MG/3ML SOPN .5 mg weekly     vitamin B-6 (PYRIDOXINE) 25 MG tablet Take 1 tablet (25 mg total) by mouth daily. 90 tablet 0   Blood Glucose Monitoring Suppl (ONETOUCH VERIO) w/Device KIT Test twice a day 1 kit 0   glucose blood (ONETOUCH ULTRA TEST) test strip Use as instructed 200 strip 1   Insulin Pen Needle (B-D UF III MINI PEN NEEDLES) 31G X 5 MM MISC USE ONCE DAILY 100 each 5   OneTouch Delica Lancets 33G MISC 1 each by Does not apply route in the morning and at bedtime. Dx:E11.9 100 each 2   No current facility-administered medications on file prior to visit.     The following portions of the patient's history were reviewed and updated as appropriate: allergies, current medications, past family history, past medical history, past social  history, past surgical history and problem list.  ROS Otherwise as in subjective above  Objective: BP 122/80   Pulse 67   Wt 168 lb (76.2 kg)   BMI 30.73 kg/m   Wt Readings from Last 3 Encounters:  11/17/23 168 lb (76.2 kg)  08/28/23 169 lb 1.5 oz (76.7 kg)  08/23/23 169 lb (76.7 kg)    General appearance: alert, no distress, well developed, well nourished Neck: supple, no lymphadenopathy, no thyromegaly, no masses Heart: RRR, normal S1, S2, no murmurs Lungs: CTA bilaterally, no wheezes, rhonchi, or rales Pulses: 2+ radial pulses, 2+ pedal pulses, normal cap refill Ext: no edema   Assessment: Encounter Diagnoses  Name Primary?   Type 2 diabetes mellitus with other specified complication, with long-term current use of insulin (HCC) Yes    Dyslipidemia associated with type 2 diabetes mellitus (HCC)    Overactive bladder    Chronic constipation      Plan: Diabetes-labs today.  Will likely increase to 1 mg Ozempic and cut down on Lantus to 35 units daily pending labs.  We discussed avoiding hypoglycemia.  We discussed measures to take in the event of hypoglycemia.  Hyperlipidemia-continue Crestor 20 mg daily and aspirin 81 mg daily.  Overactive bladder-hold off on oxybutynin short-term as she does not think is helping plus we will to see if that improves her constipation backing this off.  Chronic constipation-continue Linzess, follow-up with gastroenterology next week as planned    Emily was seen today for diabetes.  Diagnoses and all orders for this visit:  Type 2 diabetes mellitus with other specified complication, with long-term current use of insulin (HCC) -     Urine microalbumin-creatinine with uACR -     Comprehensive metabolic panel with eGFR (no eGFR if sent to [facility]) -     Hemoglobin A1c -     CBC  Dyslipidemia associated with type 2 diabetes mellitus (HCC) -     Comprehensive metabolic panel with eGFR (no eGFR if sent to [facility])  Overactive bladder -     CBC  Chronic constipation -     CBC   Follow up: pending labs

## 2023-11-18 ENCOUNTER — Other Ambulatory Visit: Payer: Self-pay | Admitting: Medical

## 2023-11-18 DIAGNOSIS — E118 Type 2 diabetes mellitus with unspecified complications: Secondary | ICD-10-CM

## 2023-11-18 LAB — CBC
Hematocrit: 38.6 % (ref 34.0–46.6)
Hemoglobin: 12.9 g/dL (ref 11.1–15.9)
MCH: 30.1 pg (ref 26.6–33.0)
MCHC: 33.4 g/dL (ref 31.5–35.7)
MCV: 90 fL (ref 79–97)
Platelets: 178 10*3/uL (ref 150–450)
RBC: 4.29 x10E6/uL (ref 3.77–5.28)
RDW: 13.6 % (ref 11.7–15.4)
WBC: 6.7 10*3/uL (ref 3.4–10.8)

## 2023-11-18 LAB — COMPREHENSIVE METABOLIC PANEL
ALT: 24 [IU]/L (ref 0–32)
AST: 23 [IU]/L (ref 0–40)
Albumin: 4.3 g/dL (ref 3.7–4.7)
Alkaline Phosphatase: 100 [IU]/L (ref 44–121)
BUN/Creatinine Ratio: 22 (ref 12–28)
BUN: 17 mg/dL (ref 8–27)
Bilirubin Total: 0.3 mg/dL (ref 0.0–1.2)
CO2: 22 mmol/L (ref 20–29)
Calcium: 9.9 mg/dL (ref 8.7–10.3)
Chloride: 105 mmol/L (ref 96–106)
Creatinine, Ser: 0.76 mg/dL (ref 0.57–1.00)
Globulin, Total: 2.1 g/dL (ref 1.5–4.5)
Glucose: 97 mg/dL (ref 70–99)
Potassium: 4.9 mmol/L (ref 3.5–5.2)
Sodium: 142 mmol/L (ref 134–144)
Total Protein: 6.4 g/dL (ref 6.0–8.5)
eGFR: 77 mL/min/{1.73_m2} (ref >59)

## 2023-11-18 LAB — MICROALBUMIN / CREATININE URINE RATIO
Creatinine, Urine: 50.3 mg/dL
Microalb/Creat Ratio: 7 mg/g{creat} (ref 0–29)
Microalbumin, Urine: 3.7 ug/mL

## 2023-11-18 LAB — HEMOGLOBIN A1C
Est. average glucose Bld gHb Est-mCnc: 131 mg/dL
Hgb A1c MFr Bld: 6.2 % — ABNORMAL HIGH (ref 4.8–5.6)

## 2023-11-18 MED ORDER — INSULIN GLARGINE 100 UNITS/ML SOLOSTAR PEN: 35.0000 [IU] | PEN_INJECTOR | Freq: Every day | SUBCUTANEOUS | 5 refills | Status: DC

## 2023-11-18 MED ORDER — SEMAGLUTIDE (1 MG/DOSE) 4 MG/3ML ~~LOC~~ SOPN: 1.0000 mg | PEN_INJECTOR | SUBCUTANEOUS | 3 refills | Status: DC

## 2023-11-18 NOTE — Telephone Encounter (Signed)
I placed a "phone in" refill on Lantus for 35 units daily.  Does this need to be called in and sent somewhere specifically with patient assistance?

## 2023-11-18 NOTE — Progress Notes (Signed)
Results sent through MyChart

## 2023-11-20 NOTE — Progress Notes (Signed)
Results sent through MyChart

## 2023-11-21 NOTE — Telephone Encounter (Signed)
I completed Novo PAP dosage change form,Form on your desk, I need your signature.

## 2023-11-21 NOTE — Telephone Encounter (Signed)
I completed Novo PAP dosage change form for the Ozempic,Form on your desk, I need your signature.    As far as the Lantus, if the dosage went down, no need to change anything with her PAP at this time, as long as pt knows what she is to take

## 2023-11-23 ENCOUNTER — Ambulatory Visit: Payer: Medicare HMO | Admitting: Nurse Practitioner

## 2023-11-23 ENCOUNTER — Other Ambulatory Visit: Payer: Self-pay | Admitting: Medical

## 2023-11-23 MED ORDER — SEMAGLUTIDE (1 MG/DOSE) 4 MG/3ML ~~LOC~~ SOPN: 1.0000 mg | PEN_INJECTOR | SUBCUTANEOUS | 3 refills | Status: AC

## 2023-11-25 NOTE — Telephone Encounter (Signed)
Dosage increase was faxed to [employer]

## 2023-11-28 ENCOUNTER — Other Ambulatory Visit (HOSPITAL_COMMUNITY): Payer: Self-pay

## 2023-11-30 DIAGNOSIS — E118 Type 2 diabetes mellitus with unspecified complications: Secondary | ICD-10-CM

## 2023-12-02 MED ORDER — LISINOPRIL 5 MG PO TABS: 5.0000 mg | ORAL_TABLET | Freq: Every day | ORAL | 1 refills | Status: DC

## 2023-12-05 MED ORDER — ONETOUCH ULTRA TEST VI STRP: ORAL_STRIP | 1 refills | Status: DC

## 2023-12-08 ENCOUNTER — Other Ambulatory Visit (INDEPENDENT_AMBULATORY_CARE_PROVIDER_SITE_OTHER): Payer: Medicare HMO

## 2023-12-08 DIAGNOSIS — E1169 Type 2 diabetes mellitus with other specified complication: Secondary | ICD-10-CM

## 2023-12-08 DIAGNOSIS — Z794 Long term (current) use of insulin: Secondary | ICD-10-CM

## 2023-12-08 MED ORDER — FREESTYLE LIBRE 3 PLUS SENSOR MISC: 11 refills | Status: DC

## 2023-12-08 NOTE — Progress Notes (Signed)
 12/08/2023 Name: Marissa Oliver MRN: [id number] DOB: 10-22-1941  Chief Complaint  Patient presents with   Diabetes    Marissa Oliver is a 83 y.o. year old female who presented for a telephone visit.   They were referred to the pharmacist by their PCP for assistance in managing diabetes and medication access.    Subjective:  Care Team: Primary Care Provider: Bulah Marissa RAMAN, PA-C    Medication Access/Adherence  Current Pharmacy:  [facility] [id number] - [place], [place] - [address] Marissa Oliver [place] [address] Phone: [phone] Fax: [phone]   Patient reports affordability concerns with their medications: No  Patient reports access/transportation concerns to their pharmacy: No  Patient reports adherence concerns with their medications:  No     Diabetes:  Current medications: Lantus  35 units daily, Ozempic  0.5mg  weekly (waiting for new 1mg  pens to arrive before increasing dose) Medications tried in the past: Farxiga , Levemir , Glyburide , Tresiba , Metformin , Janmet  Current glucose readings: 125 this morning, denies any lows over the last 2 weeks, did have one elevation of 180 but admits she ate poorly the evening before Using OneTouch meter, checking fasting once daily (sometimes a second check in evening)   Patient denies hypoglycemic Oliver/sx including dizziness, shakiness, sweating. Patient denies hyperglycemic symptoms including polyuria, polydipsia, polyphagia, nocturia, neuropathy, blurred vision.  Current meal patterns:  Tries to be mindful of carbs    Current medication access support: Lantus  through [employer], Ozempic  through [employer]   Objective:  Lab Results  Component Value Date   HGBA1C 6.2 (H) 11/17/2023    Lab Results  Component Value Date   CREATININE 0.76 11/17/2023   BUN 17 11/17/2023   NA 142 11/17/2023   K 4.9 11/17/2023   CL 105 11/17/2023   CO2 22 11/17/2023    Lab Results  Component Value Date   CHOL 127  07/21/2023   HDL 52 07/21/2023   LDLCALC 52 07/21/2023   TRIG 132 07/21/2023   CHOLHDL 2.4 07/21/2023    Medications Reviewed Today     Reviewed by Marissa Oliver, RPH (Pharmacist) on 12/08/23 at 0915  Med List Status: <None>   Medication Order Taking? Sig Documenting Provider Last Dose Status Informant  aspirin  EC 81 MG tablet [id number] Yes Take 1 tablet (81 mg total) by mouth daily. Marissa Oliver, Marissa RAMAN, PA-C Taking Active            Med Note Marissa Oliver Oliver   Thu Dec 08, 2023  9:01 AM) qod  Blood Glucose Monitoring Suppl Marissa Oliver VERIO) w/Device KIT [id number]  Test twice a day Marissa Oliver, Marissa S, PA-C  Active   Fiber POWD [id number] Yes Take 1 Dose by mouth as needed. [provider] Taking Active   gabapentin  (NEURONTIN ) 100 MG capsule [id number] Yes TAKE 1 CAPSULE BY MOUTH AT BEDTIME Marissa Oliver, Marissa RAMAN, PA-C Taking Active   glucose blood (ONETOUCH ULTRA TEST) test strip [id number]  Use as instructed Marissa Marissa RAMAN, PA-C  Active   insulin  glargine (LANTUS ) 100 unit/mL SOPN [id number] Yes Inject 35 Units into the skin daily. Marissa Oliver, Marissa RAMAN, PA-C Taking Active   Insulin  Pen Needle (B-D UF III MINI PEN NEEDLES) 31G X 5 MM MISC [id number]  USE ONCE DAILY Marissa Oliver Marissa Oliver  Active   LINZESS  290 MCG CAPS capsule [id number] Yes Take 1 capsule (290 mcg total) by mouth daily before breakfast. Take 1, Every morning before breakfast Marissa Oliver CHRISTELLA, NP Taking Active   lisinopril  (ZESTRIL ) 5  MG tablet [id number] Yes Take 1 tablet (5 mg total) by mouth daily. Marissa Oliver, Marissa RAMAN, PA-C Taking Active   Marissa Oliver  (PRILOSEC) 40 MG capsule [id number]  Take 1 capsule (40 mg total) by mouth every morning Marissa Oliver [name], NP  Active            Med Note Marissa Oliver VEAR Charlotte Mar 10, 2023 10:32 AM) [name]  OneTouch Delica Lancets 33G MISC [id number]  1 each by Does not apply route in the morning and at bedtime. Dx:E11.9 Marissa Oliver, Marissa RAMAN, PA-C  Active   oxybutynin  (DITROPAN -XL) 5 MG 24  hr tablet [id number] No Take 1 tablet (5 mg total) by mouth at bedtime.  Patient not taking: Reported on 12/08/2023   Marissa Marissa RAMAN, PA-C Not Taking Active   polyethylene glycol powder (GLYCOLAX /MIRALAX ) 17 GM/SCOOP powder [id number]  Take 17 g by mouth daily. [provider]  Active   rosuvastatin  (CRESTOR ) 20 MG tablet [id number] Yes Take 1 tablet (20 mg total) by mouth daily. Marissa Oliver, Marissa RAMAN, PA-C Taking Active   Semaglutide , 1 MG/DOSE, 4 MG/3ML SOPN [id number]  Inject 1 mg into the skin once a week. Marissa Oliver, Marissa RAMAN, PA-C  Active   vitamin B-6 (PYRIDOXINE) 25 MG tablet [id number] Yes Take 1 tablet (25 mg total) by mouth daily. Marissa Oliver, Marissa RAMAN, PA-C Taking Active               Assessment/Plan:   Diabetes: - Currently controlled - Reviewed long term cardiovascular and renal outcomes of uncontrolled blood sugar - Reviewed goal A1c, goal fasting, and goal 2 hour post prandial glucose - Reviewed dietary modifications including low carb diet - Recommend to continue Ozempic  0.5mg  until Ozempic  1mg  pens arrive -Sending in Rx for CGM Freestyle Libre 3+ sensors per standing protocol, test claim shows $0 charge for patient.  Not sending reader as patient plans to use smartphone device to track.  Continue Lantus  35 units daily - Patient denies personal or family history of multiple endocrine neoplasia type 2, medullary thyroid  cancer; personal history of pancreatitis or gallbladder disease.    Follow Up Plan: 1 week for CGM review and device setup  Marissa Oliver, PharmD Clinical Pharmacist [phone]

## 2023-12-12 MED ORDER — ROSUVASTATIN CALCIUM 20 MG PO TABS: 20.0000 mg | ORAL_TABLET | Freq: Every day | ORAL | 1 refills | Status: DC

## 2023-12-13 ENCOUNTER — Telehealth: Payer: Self-pay | Admitting: Medical

## 2023-12-13 NOTE — Telephone Encounter (Signed)
PAP OZEMPIC received, pt informed

## 2023-12-15 ENCOUNTER — Ambulatory Visit: Payer: Medicare HMO

## 2023-12-15 DIAGNOSIS — E1169 Type 2 diabetes mellitus with other specified complication: Secondary | ICD-10-CM

## 2023-12-15 MED ORDER — FREESTYLE LIBRE 3 PLUS SENSOR MISC: 3 refills | Status: DC

## 2023-12-15 NOTE — Progress Notes (Signed)
 12/15/2023 Name: Marissa Oliver MRN: [id number] DOB: 12-16-1940  Chief Complaint  Patient presents with   Diabetes    Marissa Oliver is a 83 y.o. year old female who presented for in office to review freestyle libre 3 plus sensor use and portal connection.   They were referred to the pharmacist by their PCP for assistance in managing diabetes and medication access.    Subjective:  Care Team: Primary Care Provider: Jac Canavan, PA-C    Medication Access/Adherence  Current Pharmacy:  [facility] [id number] - [place], [place] - [address] [place] [address] Phone: [phone] Fax: [phone]   Patient reports affordability concerns with their medications: No  Patient reports access/transportation concerns to their pharmacy: No  Patient reports adherence concerns with their medications:  No     Diabetes:  Current medications: Lantus 35 units daily, Ozempic 0.5mg  weekly  Medications tried in the past: Farxiga, Levemir, Glyburide, Tresiba, Metformin, Janmet  Current glucose readings: 115 this morning, denies any lows over the last 2 weeks, did have one elevation of 180 but admits she ate poorly the evening before Using OneTouch meter, checking fasting once daily (sometimes a second check in evening)   Patient denies hypoglycemic s/sx including dizziness, shakiness, sweating. Patient denies hyperglycemic symptoms including polyuria, polydipsia, polyphagia, nocturia, neuropathy, blurred vision.  Current meal patterns:  Tries to be mindful of carbs    Current medication access support: Lantus through [employer], Ozempic through [employer]   Objective:  Lab Results  Component Value Date   HGBA1C 6.2 (H) 11/17/2023    Lab Results  Component Value Date   CREATININE 0.76 11/17/2023   BUN 17 11/17/2023   NA 142 11/17/2023   K 4.9 11/17/2023   CL 105 11/17/2023   CO2 22 11/17/2023    Lab Results  Component Value Date   CHOL 127  07/21/2023   HDL 52 07/21/2023   LDLCALC 52 07/21/2023   TRIG 132 07/21/2023   CHOLHDL 2.4 07/21/2023    Medications Reviewed Today     Reviewed by Sherrill Raring, RPH (Pharmacist) on 12/15/23 at 1137  Med List Status: <None>   Medication Order Taking? Sig Documenting Provider Last Dose Status Informant  aspirin EC 81 MG tablet [id number] No Take 1 tablet (81 mg total) by mouth daily. Tysinger, Kermit Balo, PA-C Taking Active            Med Note Sherrill Raring   Thu Dec 08, 2023  9:01 AM) qod  Blood Glucose Monitoring Suppl [facility] VERIO) w/Device KIT [id number]  Test twice a day TysingerKermit Balo, PA-C  Active   Continuous Glucose Sensor (FREESTYLE LIBRE 3 PLUS SENSOR) [place] [id number]  Change sensor every 15 days. Jac Canavan, PA-C  Active   Fiber POWD [id number] No Take 1 Dose by mouth as needed. [provider] Taking Active   gabapentin (NEURONTIN) 100 MG capsule [id number] No TAKE 1 CAPSULE BY MOUTH AT BEDTIME Tysinger, Kermit Balo, PA-C Taking Active   glucose blood (ONETOUCH ULTRA TEST) test strip [id number]  Use as instructed Jac Canavan, PA-C  Active   insulin glargine (LANTUS) 100 unit/mL SOPN [id number] No Inject 35 Units into the skin daily. Tysinger, Kermit Balo, PA-C Taking Active   Insulin Pen Needle (B-D UF III MINI PEN NEEDLES) 31G X 5 MM MISC [id number]  USE ONCE DAILY Genia Del  Active   LINZESS 290 MCG CAPS capsule [id number] No Take 1 capsule (  290 mcg total) by mouth daily before breakfast. Take 1, Every morning before breakfast Meredith Pel, NP Taking Active   lisinopril (ZESTRIL) 5 MG tablet [id number] No Take 1 tablet (5 mg total) by mouth daily. Tysinger, Kermit Balo, PA-C Taking Active   omeprazole (PRILOSEC) 40 MG capsule [id number] No Take 1 capsule (40 mg total) by mouth every morning Meredith Pel, NP Taking Active            Med Note Theodis Sato Mar 10, 2023 10:32 AM) Leanora Ivanoff prn  OneTouch Delica Lancets 33G MISC [id number]   1 each by Does not apply route in the morning and at bedtime. Dx:E11.9 Tysinger, Kermit Balo, PA-C  Active   oxybutynin (DITROPAN-XL) 5 MG 24 hr tablet [id number] No Take 1 tablet (5 mg total) by mouth at bedtime.  Patient not taking: Reported on 12/08/2023   Jac Canavan, PA-C Not Taking Active   polyethylene glycol powder (GLYCOLAX/MIRALAX) 17 GM/SCOOP powder [id number] No Take 17 g by mouth daily. [provider] Taking Active   rosuvastatin (CRESTOR) 20 MG tablet [id number]  Take 1 tablet (20 mg total) by mouth daily. Tysinger, Kermit Balo, PA-C  Active   Semaglutide, 1 MG/DOSE, 4 MG/3ML SOPN [id number]  Inject 1 mg into the skin once a week. Tysinger, Kermit Balo, PA-C  Active   vitamin B-6 (PYRIDOXINE) 25 MG tablet [id number] No Take 1 tablet (25 mg total) by mouth daily. Tysinger, Kermit Balo, PA-C Taking Active               Assessment/Plan:   Diabetes: - Currently controlled - Reviewed long term cardiovascular and renal outcomes of uncontrolled blood sugar - Reviewed goal A1c, goal fasting, and goal 2 hour post prandial glucose - Reviewed dietary modifications including low carb diet - Patient denies personal or family history of multiple endocrine neoplasia type 2, medullary thyroid cancer; personal history of pancreatitis or gallbladder disease -Patient picked up Ozempic 1mg  and Lantus PAP in office today. Instructed to start Ozempic 1mg  at time her next dose of Ozempic is due. Continue Lantus 35 -Patient requested freestyle sensors be resent for 90DS fills for her convenience. -Contact us sooner than next follow up for any questions or concerns.   Follow Up Plan: 3 weeks  Sherrill Raring, PharmD Clinical Pharmacist [phone]

## 2023-12-16 ENCOUNTER — Telehealth: Payer: Self-pay

## 2023-12-16 ENCOUNTER — Other Ambulatory Visit (HOSPITAL_COMMUNITY): Payer: Self-pay

## 2023-12-16 NOTE — Telephone Encounter (Signed)
Hello,              I have received A fax stating BD PEN NEEDLE 31Gx5MM needles would need an Prior Authorization. Hoever the plan will allow a  day filll so thr patient isnt without until then. I have confirmed the patient has picked up that 30 day fill as of 2.9.25. I will begin her P.A for future fills.    Contact: [phone] Case id: [id number]

## 2023-12-16 NOTE — Telephone Encounter (Signed)
A verbal Prior Authorization has been completed. (BD PEN NEEDLE 31Gx5MM)  has been Approved from 1.1.25 to 12.31.25.the patient  has last picked up the rx on 2.9.25.    Contact: [phone] Case id: [id number]

## 2023-12-28 DIAGNOSIS — H401131 Primary open-angle glaucoma, bilateral, mild stage: Secondary | ICD-10-CM | POA: Diagnosis not present

## 2023-12-28 LAB — HM DIABETES EYE EXAM

## 2023-12-29 ENCOUNTER — Other Ambulatory Visit: Payer: Self-pay | Admitting: Internal Medicine

## 2023-12-29 ENCOUNTER — Encounter: Payer: Self-pay | Admitting: Nurse Practitioner

## 2023-12-29 ENCOUNTER — Ambulatory Visit: Payer: Medicare HMO | Admitting: Nurse Practitioner

## 2023-12-29 VITALS — BP 112/58 | HR 64 | Wt 164.0 lb

## 2023-12-29 DIAGNOSIS — Z860101 Personal history of adenomatous and serrated colon polyps: Secondary | ICD-10-CM | POA: Diagnosis not present

## 2023-12-29 DIAGNOSIS — K5900 Constipation, unspecified: Secondary | ICD-10-CM

## 2023-12-29 DIAGNOSIS — K5909 Other constipation: Secondary | ICD-10-CM

## 2023-12-29 NOTE — Progress Notes (Signed)
 Agree with assessment and plan as outlined.

## 2023-12-29 NOTE — Patient Instructions (Signed)
 Miralax- take every night and may increase to twice daily  Stop Linzess.  Dulcolax- take 1 to 2 tablets every 3rd night as needed (over the counter)  Follow up as needed.  Due to recent changes in healthcare laws, you may see the results of your imaging and laboratory studies on MyChart before your provider has had a chance to review them.  We understand that in some cases there may be results that are confusing or concerning to you. Not all laboratory results come back in the same time frame and the provider may be waiting for multiple results in order to interpret others.  Please give [place] 48 hours in order for your provider to thoroughly review all the results before contacting the office for clarification of your results.   Thank you for trusting me with your gastrointestinal care!   Alcide Evener, CRNP

## 2023-12-29 NOTE — Progress Notes (Signed)
 12/29/2023 [name] [id number] 1941-05-07   Chief Complaint: Constipation   History of Present Illness: Marissa Oliver. Pavelko is an 83 year old female with a past medical history of anxiety, depression, diabetes mellitus type 2, GERD, diverticulitis and colon polyps.  Previously followed by Dr. Orvan Falconer, now by Dr. Adela Lank.  She presents today for follow-up regarding constipation.  She is taking Linzess 290 mcg 1 tab most days which she stated drains her and she feels terrible when she takes it.  She endorses passing 3-4 large-volume runny nonbloody stools within 2 to 3 hours after taking Linzess.  She skipped Linzess this morning as she did not want to have loose stools prior to her appointment.  She took MiraLAX this morning since she did not take Linzess.  She infrequently has twinges of mild discomfort to the left mid abdominal area, no significant abdominal pain.  No abdominal discomfort/pain today.  No weight loss.  No fevers.  She had night sweats a few times a few months ago without further recurrence.  She was recently diagnosed with glaucoma.  Her cousin recently died from pancreatic cancer.  She has a history of colon polyps, stated her first colonoscopy was done at the age of 31.  Her most recent colonoscopy by Dr. Loreta Oliver 08/31/2010 identified 11 tubular adenomatous and hyperplastic polyps.  No further colonoscopies were recommended due to age.  No GERD symptoms or dysphagia.      Latest Ref Rng & Units 11/17/2023   11:53 AM 01/10/2023   12:03 PM 02/03/2022    8:30 AM  CBC  WBC 3.4 - 10.8 x10E3/uL 6.7  6.0  5.6   Hemoglobin 11.1 - 15.9 g/dL 40.1  02.7  25.3   Hematocrit 34.0 - 46.6 % 38.6  37.1  37.7   Platelets 150 - 450 x10E3/uL 178  204  181        Latest Ref Rng & Units 11/17/2023   11:53 AM 04/14/2023   10:52 AM 01/10/2023   12:03 PM  CMP  Glucose 70 - 99 mg/dL 97  664  403   BUN 8 - 27 mg/dL 17  18  21    Creatinine 0.57 - 1.00 mg/dL 4.74  2.59  5.63   Sodium 134 - 144  mmol/L 142  139  138   Potassium 3.5 - 5.2 mmol/L 4.9  4.3  4.3   Chloride 96 - 106 mmol/L 105  102  105   CO2 20 - 29 mmol/L 22  22  19    Calcium 8.7 - 10.3 mg/dL 9.9  9.7  87.5   Total Protein 6.0 - 8.5 g/dL 6.4  6.7  6.5   Total Bilirubin 0.0 - 1.2 mg/dL 0.3  0.3  0.2   Alkaline Phos 44 - 121 IU/L 100  108  90   AST 0 - 40 IU/L 23  18  17    ALT 0 - 32 IU/L 24  19  23       Colonoscopy 08/31/2010: 11 small sessile polyps removed from the right colon, rectosigmoid and rectum with scattered diverticulosis throughout the colon A.  Colon, rectosigmoid, rectum polyps: Hyperplastic polyps B.  Colon, descending, polyp: Tubular adenoma.  No high-grade dysplasia or malignancy. The margin of excision appears free of adenomatous epithelium.   Past Medical History:  Diagnosis Date   Atrophic vaginitis    estrace cream rx'd by Dr. Vernie Oliver   Colon polyp    tubular adenoma (colonoscopy q5 yrs)   Diabetes  mellitus 2005   Diverticulosis    Dyslipidemia    GAD (generalized anxiety disorder)    Hemorrhoids    Hyperlipidemia    OAB (overactive bladder)    Osteoporosis    Psoriasis    mild, sees [facility]   Past Surgical History:  Procedure Laterality Date   ABDOMINAL HYSTERECTOMY  11/01/1970   partial   APPENDECTOMY     CARDIOVASCULAR STRESS TEST  12/25/2004   EF 65%. OVERALL NORMAL STRESS CARDIOLITE. NO EVIDENCE  OF ISCHEMIA   COLONOSCOPY  08/01/2010   cologuard 01/2016 normal   CYSTOSCOPY  03/2023   At [facility] urology   PUBOVAGINAL SLING  04/01/1998   TUBAL LIGATION     Current Outpatient Medications on File Prior to Visit  Medication Sig Dispense Refill   aspirin EC 81 MG tablet Take 1 tablet (81 mg total) by mouth daily. 90 tablet 3   Blood Glucose Monitoring Suppl (ONETOUCH VERIO) w/Device KIT Test twice a day 1 kit 0   Continuous Glucose Sensor (FREESTYLE LIBRE 3 PLUS SENSOR) MISC Use as directed to continuously monitor blood sugars. Change sensor every 15 days. 6  each 3   Fiber POWD Take 1 Dose by mouth as needed.     gabapentin (NEURONTIN) 100 MG capsule TAKE 1 CAPSULE BY MOUTH AT BEDTIME 90 capsule 1   glucose blood (ONETOUCH ULTRA TEST) test strip Use as instructed 200 strip 1   insulin glargine (LANTUS) 100 unit/mL SOPN Inject 35 Units into the skin daily. 15 mL 5   Insulin Pen Needle (B-D UF III MINI PEN NEEDLES) 31G X 5 MM MISC USE ONCE DAILY 100 each 5   ketotifen (ZADITOR) 0.035 % ophthalmic solution Place 1 drop into both eyes 2 (two) times daily.     LINZESS 290 MCG CAPS capsule Take 1 capsule (290 mcg total) by mouth daily before breakfast. Take 1, Every morning before breakfast 30 capsule 3   lisinopril (ZESTRIL) 5 MG tablet Take 1 tablet (5 mg total) by mouth daily. 90 tablet 1   nitrofurantoin (MACRODANTIN) 100 MG capsule Take 100 mg by mouth daily.     OneTouch Delica Lancets 33G MISC 1 each by Does not apply route in the morning and at bedtime. Dx:E11.9 100 each 2   polyethylene glycol powder (GLYCOLAX/MIRALAX) 17 GM/SCOOP powder Take 17 g by mouth daily.     rosuvastatin (CRESTOR) 20 MG tablet Take 1 tablet (20 mg total) by mouth daily. 90 tablet 1   Semaglutide, 1 MG/DOSE, 4 MG/3ML SOPN Inject 1 mg into the skin once a week. 3 mL 3   vitamin B-6 (PYRIDOXINE) 25 MG tablet Take 1 tablet (25 mg total) by mouth daily. 90 tablet 0   omeprazole (PRILOSEC) 40 MG capsule Take 1 capsule (40 mg total) by mouth every morning (Patient not taking: Reported on 12/29/2023) 90 capsule 3   oxybutynin (DITROPAN-XL) 5 MG 24 hr tablet Take 1 tablet (5 mg total) by mouth at bedtime. (Patient not taking: Reported on 12/29/2023) 90 tablet 3   No current facility-administered medications on file prior to visit.   Allergies  Allergen Reactions   Farxiga [Dapagliflozin]     Recurrent urinary tract infection   Current Medications, Allergies, Past Medical History, Past Surgical History, Family History and Social History were reviewed in [employer] record.  Review of Systems:   Constitutional: Negative for fever, sweats, chills or weight loss.  Respiratory: Negative for shortness of breath.   Cardiovascular: Negative for  chest pain, palpitations and leg swelling.  Gastrointestinal: See HPI.  Musculoskeletal: Negative for back pain or muscle aches.  Neurological: Negative for dizziness, headaches or paresthesias.   Physical Exam: BP (!) 112/58   Pulse 64   Wt 164 lb (74.4 kg)   BMI 30.00 kg/m   General: 83 year old female in no acute distress.  In no acute distress. Head: Normocephalic and atraumatic. Eyes: No scleral icterus. Conjunctiva pink . Ears: Normal auditory acuity. Mouth: Upper dentures.  No ulcers or lesions.  Lungs: Clear throughout to auscultation. Heart: Regular rate and rhythm, no murmur. Abdomen: Soft, nontender and nondistended. No masses or hepatomegaly. Normal bowel sounds x 4 quadrants.  Rectal: Deferred.  Musculoskeletal: Symmetrical with no gross deformities. Extremities: No edema. Neurological: Alert oriented x 4. No focal deficits.  Psychological: Alert and cooperative. Normal mood and affect  Assessment and Recommendations:  83 year old female with chronic constipation, feels unwell on Linzess which resulted in passing 3-4 large-volume loose bowel movements daily.  She infrequently has twinges of left mid abdominal discomfort, no severe abdominal pain. -I discussed reducing the dose of Linzess versus taking MiraLAX once to twice daily.  She prefers to discontinue Linzess, she will start MiraLAX consistently once daily and may increase to twice daily if needed. -Dulcolax 1-2 tabs every third night as needed -Dietary fiber as tolerated -Drink 6 to 8 glasses water daily -I discussed scheduling CTAP if she has persistent abdominal pain or if her constipation worsens -Follow-up as needed  History of colon polyps.  Her most recent colonoscopy in 2011 identified 11 tubular adenomatous  and hyperplastic polyps. -No further colon polyp surveillance colonoscopies due to age  GERD, stable  -Continue Omeprazole 40 mg daily  Today's encounter was 25 minutes which included precharting, chart/result review, history/exam, face-to-face time used for counseling, formulating a treatment plan with follow-up and documentation.

## 2024-01-05 ENCOUNTER — Other Ambulatory Visit (INDEPENDENT_AMBULATORY_CARE_PROVIDER_SITE_OTHER): Payer: Medicare HMO

## 2024-01-05 DIAGNOSIS — Z794 Long term (current) use of insulin: Secondary | ICD-10-CM

## 2024-01-05 DIAGNOSIS — E1169 Type 2 diabetes mellitus with other specified complication: Secondary | ICD-10-CM

## 2024-01-05 NOTE — Progress Notes (Signed)
   01/05/2024 Name: Marissa Oliver MRN: [id number] DOB: 1941/06/22  Chief Complaint  Patient presents with   Diabetes   Medication Management    Marissa Oliver is a 83 y.o. year old female who presented via telephone today for phone call visit.   They were referred to the pharmacist by their PCP for assistance in managing diabetes and medication access.    Subjective:  Care Team: Primary Care Provider: Jac Canavan, PA-C    Medication Access/Adherence  Current Pharmacy:  [facility] [id number] - [place], [place] - [address] [place] [address] Phone: [phone] Fax: [phone]   Patient reports affordability concerns with their medications: No  Patient reports access/transportation concerns to their pharmacy: No  Patient reports adherence concerns with their medications:  No     Diabetes:  Current medications: Lantus 30 units daily, Ozempic 0.5mg  weekly  Medications tried in the past: Farxiga, Levemir, Glyburide, Tresiba, Metformin, Janmet  Current glucose readings:    Patient has several nights of lows around 2-4am, even since decreasing lantus to 30 units. Got as low as 40. Ranged from 40-66. Did not realize PCP had instructed her to decrease to 27 units if lows continued.   Patient denies hypoglycemic s/sx including dizziness, shakiness, sweating. Patient denies hyperglycemic symptoms including polyuria, polydipsia, polyphagia, nocturia, neuropathy, blurred vision.  Current meal patterns:  Tries to be mindful of carbs    Current medication access support: Lantus through [employer], Ozempic through [employer]   Objective:  Lab Results  Component Value Date   HGBA1C 6.2 (H) 11/17/2023    Lab Results  Component Value Date   CREATININE 0.76 11/17/2023   BUN 17 11/17/2023   NA 142 11/17/2023   K 4.9 11/17/2023   CL 105 11/17/2023   CO2 22 11/17/2023    Lab Results  Component Value Date   CHOL 127 07/21/2023   HDL 52  07/21/2023   LDLCALC 52 07/21/2023   TRIG 132 07/21/2023   CHOLHDL 2.4 07/21/2023    Medications Reviewed Today   Medications were not reviewed in this encounter       Assessment/Plan:   Diabetes: - Currently controlled but having lows - Reviewed long term cardiovascular and renal outcomes of uncontrolled blood sugar - Reviewed goal A1c, goal fasting, and goal 2 hour post prandial glucose - Reviewed dietary modifications including low carb diet - Patient denies personal or family history of multiple endocrine neoplasia type 2, medullary thyroid cancer; personal history of pancreatitis or gallbladder disease -DECREASE Lantus to 27 units as previously instructed by PCP since lows have continued. -Contact us sooner than next follow up for any questions or concerns.   Follow Up Plan: 1 month  Sherrill Raring, PharmD Clinical Pharmacist [phone]

## 2024-01-16 ENCOUNTER — Telehealth: Payer: Self-pay

## 2024-01-16 NOTE — Progress Notes (Signed)
   01/16/2024  Patient ID: Marissa Oliver, female   DOB: 11/09/1940, 83 y.o.   MRN: [id number]  Missed a call from patient this morning on her mobile phone but she did not leave a voicemail. Attempted to return call but no answer. Left voicemail to return my call if anything still needed.  Sherrill Raring, PharmD Clinical Pharmacist [phone]

## 2024-01-17 ENCOUNTER — Telehealth: Payer: Self-pay

## 2024-01-17 NOTE — Progress Notes (Signed)
   01/17/2024  Patient ID: Marissa Oliver, female   DOB: Mar 18, 1941, 83 y.o.   MRN: [id number]  Patient reports she is getting different BG readings on her sensors vs her finger pricks. Got a reading of 69 but didn't feel low so she pricked her finger and reported readings in the 120s.  Encouraged patient to make sure her test strips are not expired and that her meter is up to date as she reports she may be using expired supplies.   If variations or other concerns continue, patient will call back.  Sherrill Raring, PharmD Clinical Pharmacist [phone]

## 2024-01-18 ENCOUNTER — Telehealth: Payer: Self-pay | Admitting: Internal Medicine

## 2024-01-18 DIAGNOSIS — Z794 Long term (current) use of insulin: Secondary | ICD-10-CM

## 2024-01-18 DIAGNOSIS — E1169 Type 2 diabetes mellitus with other specified complication: Secondary | ICD-10-CM

## 2024-01-18 MED ORDER — LANCETS MISC. MISC: 1 refills | Status: DC

## 2024-01-18 MED ORDER — LANCET DEVICE MISC: 0 refills | Status: DC

## 2024-01-18 MED ORDER — BLOOD GLUCOSE TEST VI STRP: ORAL_STRIP | 1 refills | Status: DC

## 2024-01-18 MED ORDER — BLOOD GLUCOSE MONITORING SUPPL DEVI: 0 refills | Status: DC

## 2024-01-18 NOTE — Telephone Encounter (Signed)
 Sent one touch ultra 2 meter in and sent message to Vincenza Hews to review

## 2024-01-18 NOTE — Telephone Encounter (Signed)
 I sent in one touch ultra 2 meter and supplies

## 2024-01-18 NOTE — Telephone Encounter (Signed)
 Copied from CRM [id number]. Topic: Clinical - Medical Advice >> Jan 18, 2024  8:15 AM Izetta Dakin wrote: Reason for CRM: Patient states that her glucose monitor went off several times during the night and she think it may be a malfunction. She states that she took her medication but is wanting to speak with someone in the office. Patient is  also requesting One Touch Ultra 2. Please call patient back with directions. [phone]

## 2024-01-19 ENCOUNTER — Ambulatory Visit
Admission: RE | Admit: 2024-01-19 | Discharge: 2024-01-19 | Disposition: A | Discharge status: Home / Self Care | Payer: Medicare HMO | Source: Ambulatory Visit | Attending: Medical | Admitting: Medical

## 2024-01-19 DIAGNOSIS — M8588 Other specified disorders of bone density and structure, other site: Secondary | ICD-10-CM | POA: Diagnosis not present

## 2024-01-19 DIAGNOSIS — M810 Age-related osteoporosis without current pathological fracture: Secondary | ICD-10-CM

## 2024-01-19 DIAGNOSIS — N958 Other specified menopausal and perimenopausal disorders: Secondary | ICD-10-CM | POA: Diagnosis not present

## 2024-01-19 DIAGNOSIS — E2839 Other primary ovarian failure: Secondary | ICD-10-CM | POA: Diagnosis not present

## 2024-01-30 NOTE — Progress Notes (Signed)
 Bone density shows osteoporosis like prior.   I recommend trial of  Evenity to help improve bone building.  This is a monthly injection for a year, then we would transition to a different medication.   Her bone density shows at risk for osteoporosis, and Evenity help to counter aging effects of bone.  If agreeable, we can see insurance would cover this.

## 2024-02-01 NOTE — Telephone Encounter (Signed)
 Per Kristian Covey, PA-C - Is there a way to get Evenity through maybe a Medicare part that is less expensive out of pocket?

## 2024-02-07 MED ORDER — INSULIN GLARGINE 100 UNITS/ML SOLOSTAR PEN
10.0000 [IU] | PEN_INJECTOR | Freq: Every day | SUBCUTANEOUS | Status: AC
Start: 2024-02-07 — End: ?

## 2024-02-09 ENCOUNTER — Other Ambulatory Visit

## 2024-02-09 DIAGNOSIS — E1169 Type 2 diabetes mellitus with other specified complication: Secondary | ICD-10-CM

## 2024-02-09 DIAGNOSIS — Z794 Long term (current) use of insulin: Secondary | ICD-10-CM

## 2024-02-09 NOTE — Progress Notes (Signed)
 02/09/2024 Name: Marissa Oliver MRN: [id number] DOB: 1941-06-22  Chief Complaint  Patient presents with   Diabetes   Medication Management    Marissa Oliver is a 83 y.o. year old female who presented via telephone today for phone call visit.   They were referred to the pharmacist by their PCP for assistance in managing diabetes and medication access.    Subjective:  Care Team: Primary Care Provider: Jac Canavan, PA-C    Medication Access/Adherence  Current Pharmacy:  [id number] - [place], [place] - [address] [place] [address] Phone: [phone] Fax: [phone]   Patient reports affordability concerns with their medications: No  Patient reports access/transportation concerns to their pharmacy: No  Patient reports adherence concerns with their medications:  No     Diabetes:  Current medications: Lantus 10 units daily, Ozempic 1mg  weekly  Medications tried in the past: Farxiga, Levemir, Glyburide, Tresiba, Metformin, Janmet  Current glucose readings:     Patient denies hypoglycemic s/sx including dizziness, shakiness, sweating. Patient denies hyperglycemic symptoms including polyuria, polydipsia, polyphagia, nocturia, neuropathy, blurred vision.  Current meal patterns:  Tries to be mindful of carbs    Current medication access support: Lantus through [employer], Ozempic through [employer]   Objective:  Lab Results  Component Value Date   HGBA1C 6.2 (H) 11/17/2023    Lab Results  Component Value Date   CREATININE 0.76 11/17/2023   BUN 17 11/17/2023   NA 142 11/17/2023   K 4.9 11/17/2023   CL 105 11/17/2023   CO2 22 11/17/2023    Lab Results  Component Value Date   CHOL 127 07/21/2023   HDL 52 07/21/2023   LDLCALC 52 07/21/2023   TRIG 132 07/21/2023   CHOLHDL 2.4 07/21/2023    Medications Reviewed Today     Reviewed by Sherrill Raring, RPH (Pharmacist) on 02/09/24 at 1005  Med List Status: <None>    Medication Order Taking? Sig Documenting Provider Last Dose Status Informant  aspirin EC 81 MG tablet [id number] Yes Take 1 tablet (81 mg total) by mouth daily. Tysinger, Kermit Balo, PA-C Taking Active            Med Note Sherrill Raring   Thu Dec 08, 2023  9:01 AM) qod  Blood Glucose Monitoring Suppl DEVI [id number]  Test 1-2 times day. One touch ultra 2 Tysinger, Kermit Balo, PA-C  Active   Continuous Glucose Sensor (FREESTYLE LIBRE 3 PLUS SENSOR) MISC [id number] Yes Use as directed to continuously monitor blood sugars. Change sensor every 15 days. Genia Del Taking Active   Fiber POWD [id number] Yes Take 1 Dose by mouth as needed. [provider] Taking Active   gabapentin (NEURONTIN) 100 MG capsule [id number] Yes TAKE 1 CAPSULE BY MOUTH AT BEDTIME TysingerKermit Balo, PA-C Taking Active   Glucose Blood (BLOOD GLUCOSE TEST STRIPS) STRP [id number]  Test 1-2 times daily Jac Canavan, PA-C  Active   insulin glargine (LANTUS) 100 unit/mL SOPN [id number] Yes Inject 10 Units into the skin daily. Tysinger, Kermit Balo, PA-C Taking Active   Insulin Pen Needle (B-D UF III MINI PEN NEEDLES) 31G X 5 MM MISC [id number]  USE ONCE DAILY Genia Del  Active   Lancet Device MISC [id number]  1-2 times daily Tysinger, Kermit Balo, PA-C  Active   Lancets Misc. MISC [id number]  1-2 times a day Jac Canavan, PA-C  Active   latanoprost (XALATAN) 0.005 % ophthalmic solution  [id number] Yes SMARTSIG:In Eye(s) [provider] Taking Active   LINZESS 290 MCG CAPS capsule [id number] Yes Take 1 capsule (290 mcg total) by mouth daily before breakfast. Take 1, Every morning before breakfast Meredith Pel, NP Taking Active            Med Note Leitha Bleak, Bonnie Roig H   Thu Jan 05, 2024  9:10 AM) Taking prn, about every 3 days  lisinopril (ZESTRIL) 5 MG tablet [id number] Yes Take 1 tablet (5 mg total) by mouth daily. Tysinger, Kermit Balo, PA-C Taking Active   nitrofurantoin (MACRODANTIN) 100 MG capsule  [id number]  Take 100 mg by mouth daily. [provider]  Active   OneTouch Delica Lancets 33G MISC [id number]  1 each by Does not apply route in the morning and at bedtime. Dx:E11.9 Tysinger, Kermit Balo, PA-C  Active   polyethylene glycol powder (GLYCOLAX/MIRALAX) 17 GM/SCOOP powder [id number] Yes Take 17 g by mouth daily. [provider] Taking Active   rosuvastatin (CRESTOR) 20 MG tablet [id number] Yes Take 1 tablet (20 mg total) by mouth daily. Tysinger, Kermit Balo, PA-C Taking Active   Semaglutide, 1 MG/DOSE, 4 MG/3ML SOPN [id number] Yes Inject 1 mg into the skin once a week. Tysinger, Kermit Balo, PA-C Taking Active   vitamin B-6 (PYRIDOXINE) 25 MG tablet [id number] Yes Take 1 tablet (25 mg total) by mouth daily. Tysinger, Kermit Balo, PA-C Taking Active               Assessment/Plan:   Diabetes: - Currently controlled  - Reviewed long term cardiovascular and renal outcomes of uncontrolled blood sugar - Reviewed goal A1c, goal fasting, and goal 2 hour post prandial glucose - Reviewed dietary modifications including low carb diet - Patient denies personal or family history of multiple endocrine neoplasia type 2, medullary thyroid cancer; personal history of pancreatitis or gallbladder disease -Continue current medication therapy -Contact us sooner than next follow up for any questions or concerns.   Follow Up Plan: As needed following next PCP visit, patient will reach out sooner if any concerns  Sherrill Raring, PharmD Clinical Pharmacist [phone]

## 2024-02-09 NOTE — Telephone Encounter (Signed)
 Not aware of any open evenity assistance at this time. [employer] PAP excludes Med Part D patients and [employer] fund is closed. Rep may have other insight but if he does not, tymlos may be an alternative medication. They have PAP that patient should qualify for.

## 2024-02-09 NOTE — Telephone Encounter (Signed)
 I finally heard back from an [employer] rep & got the number for our Evenity rep Maud Deed t# [phone] I called him and left a message. Marissa Oliver I see pt has an appt with you today, do you know of any options to help with Evenity ?

## 2024-02-10 ENCOUNTER — Telehealth: Payer: Self-pay | Admitting: Medical

## 2024-02-13 ENCOUNTER — Telehealth: Payer: Self-pay | Admitting: Medical

## 2024-02-13 NOTE — Telephone Encounter (Signed)
error 

## 2024-02-13 NOTE — Telephone Encounter (Signed)
 Called Rep Joseph Mazzota t# [phone] & he states Medicare advantage plans (which pt has) cost 20% of medication which is around $400 until deductible is met, Medicare state health has $0 out of pocket & also Medicare with a supplement ins has $0 out of pocket.  I asked about Patient Assistance & he gave [employer] t#  [phone].  I called & there is no Pt Assist for Prolia or [employer], there are foundations that assist with these.  Pharmacist Shelvy Dickens states "Not aware of any open evenity assistance at this time. [employer] PAP excludes Med Part D patients and [employer] fund is closed. Rep may have other insight but if he does not, tymlos may be an alternative medication. They have PAP that patient should qualify for"  Do you want to try Tymlos or we can see if there is a foundation that can help?

## 2024-02-15 ENCOUNTER — Other Ambulatory Visit: Payer: Self-pay | Admitting: Nurse Practitioner

## 2024-02-15 ENCOUNTER — Telehealth: Payer: Self-pay | Admitting: Nurse Practitioner

## 2024-02-15 NOTE — Telephone Encounter (Signed)
 Inbound call from patient stating she has been having complications with GERD. States she stopped taking omeprazole medication but is requesting for a refill. Requesting a call back. Please advise, thank you.

## 2024-02-15 NOTE — Telephone Encounter (Signed)
**Note De-identified  [place] Obfuscation** Please advise 

## 2024-02-16 MED ORDER — OMEPRAZOLE 40 MG PO CPDR: 40.0000 mg | DELAYED_RELEASE_CAPSULE | Freq: Every morning | ORAL | 1 refills | Status: DC

## 2024-02-16 NOTE — Telephone Encounter (Signed)
 DD, okay to refill Omeprazole 40 mg daily to be taken 30 minutes before breakfast #90, 1 refill.  Patient to schedule an office follow-up appointment if GERD symptoms persist or worsen.  Thank you.

## 2024-02-16 NOTE — Telephone Encounter (Signed)
 Rx sent and MyChart message was sent to patient.

## 2024-02-20 ENCOUNTER — Encounter: Payer: Self-pay | Admitting: Internal Medicine

## 2024-02-20 DIAGNOSIS — M810 Age-related osteoporosis without current pathological fracture: Secondary | ICD-10-CM

## 2024-02-21 ENCOUNTER — Telehealth: Payer: Self-pay

## 2024-02-21 MED ORDER — ROMOSOZUMAB-AQQG 105 MG/1.17ML ~~LOC~~ SOSY: 210.0000 mg | PREFILLED_SYRINGE | Freq: Once | SUBCUTANEOUS | Status: DC

## 2024-02-21 NOTE — Telephone Encounter (Signed)
 Evenity VOB initiated via [email or web address]  Last Evenity inj:  Next Evenity inj DUE:  NEW START

## 2024-02-21 NOTE — Telephone Encounter (Signed)
 Jimmye Moulds- Spoke with Joe the rep and he recommends going ahead and starting the process for Evenity  to see if its affordable. I have put in an order for this.  However, when patient comes in on Monday May 5th we can know how much this would cost.   Prior [employer]- This would be a new start for Evenity  depending on coverage and cost. Can you go ahead and start the process so when patient comes in for an appointment on may 5th with Lovett Ruck, we can inform patient of price

## 2024-02-22 NOTE — Telephone Encounter (Addendum)
 Per PA, Prolia  and zoledronic acid are preferred. Patient must fail or have a clinical reason they cannot use BOTH Prolia  and zoledronic acid before coverage of Evenity . Please advise if the preferred alternatives are appropriate and I can start the benefit verification.    Authorization Number : [id number]

## 2024-02-22 NOTE — Telephone Encounter (Signed)
 Marissa Oliver

## 2024-02-23 ENCOUNTER — Other Ambulatory Visit

## 2024-02-23 DIAGNOSIS — E1169 Type 2 diabetes mellitus with other specified complication: Secondary | ICD-10-CM

## 2024-02-23 DIAGNOSIS — Z794 Long term (current) use of insulin: Secondary | ICD-10-CM

## 2024-02-23 NOTE — Telephone Encounter (Signed)
 Zoledronic acid is available under two distinct brand names, each with different FDA-approved indications. Zometa is FDA-approved for the treatment of patients with multiple myeloma and for patients with documented bone metastases from solid tumors, used in conjunction with standard antineoplastic therapy. In contrast, the insurance-preferred formulation, Reclast, is FDA-approved for the treatment and prevention of osteoporosis in postmenopausal women, osteoporosis in men, glucocorticoid-induced osteoporosis, and for the treatment of Paget's disease of bone.

## 2024-02-23 NOTE — Telephone Encounter (Signed)
 She has never had prolia . I was told to put in Evenity  to run it through 1st before putting in prolia .

## 2024-02-23 NOTE — Progress Notes (Signed)
 02/23/2024 Name: Marissa Oliver MRN: [id number] DOB: July 13, 1941  Chief Complaint  Patient presents with   Diabetes   Medication Management    Marissa Oliver is a 83 y.o. year old female who presented via telephone today for phone call visit.   They were referred to the pharmacist by their PCP for assistance in managing diabetes and medication access.    Subjective:  Care Team: Primary Care Provider: Claudene Crystal, PA-C    Medication Access/Adherence  Current Pharmacy:  [id number] - [place], [place] - [address] [address] Phone: [phone] Fax: [phone]   Patient reports affordability concerns with their medications: No  Patient reports access/transportation concerns to their pharmacy: No  Patient reports adherence concerns with their medications:  No     Diabetes:  Current medications: Ozempic  1mg  weekly on Sundays Medications tried in the past: Farxiga , Levemir , Glyburide , Tresiba , Metformin , Janmet, Lantus   Current glucose readings:    Sugars have begun to creep up with holding lantus  and holiday eating   Patient denies hypoglycemic s/sx including dizziness, shakiness, sweating. Patient denies hyperglycemic symptoms including polyuria, polydipsia, polyphagia, nocturia, neuropathy, blurred vision.  Current meal patterns:  Tries to be mindful of carbs but states she could be better, especially with the recent holiday   Current medication access support: Lantus  through [employer], Ozempic  through [employer]   Objective:  Lab Results  Component Value Date   HGBA1C 6.2 (H) 11/17/2023    Lab Results  Component Value Date   CREATININE 0.76 11/17/2023   BUN 17 11/17/2023   NA 142 11/17/2023   K 4.9 11/17/2023   CL 105 11/17/2023   CO2 22 11/17/2023    Lab Results  Component Value Date   CHOL 127 07/21/2023   HDL 52 07/21/2023   LDLCALC 52 07/21/2023   TRIG 132 07/21/2023   CHOLHDL 2.4 07/21/2023     Medications Reviewed Today     Reviewed by Carnell Christian, RPH (Pharmacist) on 02/23/24 at 1319  Med List Status: <None>   Medication Order Taking? Sig Documenting Provider Last Dose Status Informant  aspirin  EC 81 MG tablet [id number] No Take 1 tablet (81 mg total) by mouth daily. Tysinger, Christiane Cowing, PA-C Taking Active            Med Note Carnell Christian   Thu Dec 08, 2023  9:01 AM) qod  Blood Glucose Monitoring Suppl DEVI [id number]  Test 1-2 times day. One touch ultra 2 Tysinger, Christiane Cowing, PA-C  Active   Continuous Glucose Sensor (FREESTYLE LIBRE 3 PLUS SENSOR) MISC [id number] No Use as directed to continuously monitor blood sugars. Change sensor every 15 days. Garner Jury Taking Active   Fiber POWD [id number] No Take 1 Dose by mouth as needed. [provider] Taking Active   gabapentin  (NEURONTIN ) 100 MG capsule [id number] No TAKE 1 CAPSULE BY MOUTH AT BEDTIME Claudene Crystal, PA-C Taking Active   Glucose Blood (BLOOD GLUCOSE TEST STRIPS) STRP [id number]  Test 1-2 times daily Claudene Crystal, PA-C  Active   insulin  glargine (LANTUS ) 100 unit/mL SOPN [id number] No Inject 10 Units into the skin daily. Tysinger, Christiane Cowing, PA-C Taking Active            Med Note Zilphia Hilt, Daria Eddy   Thu Feb 23, 2024  1:18 PM) Holding as of 02/16/23  Insulin  Pen Needle (B-D UF III MINI PEN NEEDLES) 31G X 5 MM MISC [id number] No USE  ONCE DAILY Tysinger, Newt Barefoot Taking Active   Lancet Device MISC [id number]  1-2 times daily Tysinger, Christiane Cowing, PA-C  Active   Lancets Misc. MISC [id number]  1-2 times a day Claudene Crystal, PA-C  Active   latanoprost (XALATAN) 0.005 % ophthalmic solution [id number] No SMARTSIG:In Eye(s) [provider] Taking Active   LINZESS  290 MCG CAPS capsule [id number] No Take 1 capsule (290 mcg total) by mouth daily before breakfast. Take 1, Every morning before breakfast Arlee Bellows, NP Taking Active            Med Note Zilphia Hilt, Moksh Loomer H   Thu Jan 05, 2024  9:10 AM) Taking prn, about every 3 days  lisinopril  (ZESTRIL ) 5 MG tablet [id number] No Take 1 tablet (5 mg total) by mouth daily. Tysinger, Christiane Cowing, PA-C Taking Active   nitrofurantoin  (MACRODANTIN ) 100 MG capsule [id number] No Take 100 mg by mouth daily. [provider] Taking Active   omeprazole  (PRILOSEC) 40 MG capsule [id number]  Take 1 capsule (40 mg total) by mouth every morning. Take 30 minutes before breakfast. Kennedy-Smith, Colleen M, NP  Active   OneTouch Delica Lancets 33G MISC [id number] No 1 each by Does not apply route in the morning and at bedtime. Dx:E11.9 Tysinger, Christiane Cowing, PA-C Taking Active   polyethylene glycol powder (GLYCOLAX /MIRALAX ) 17 GM/SCOOP powder [id number] No Take 17 g by mouth daily. [provider] Taking Active   Romosozumab -aqqg (EVENITY ) 105 MG/1. injection 210 mg [id number]   TysingerChristiane Cowing, PA-C  Active   rosuvastatin  (CRESTOR ) 20 MG tablet [id number] No Take 1 tablet (20 mg total) by mouth daily. Tysinger, Christiane Cowing, PA-C Taking Active   Semaglutide , 1 MG/DOSE, 4 MG/3ML SOPN [id number] No Inject 1 mg into the skin once a week. Tysinger, Christiane Cowing, PA-C Taking Active   vitamin B-6 (PYRIDOXINE) 25 MG tablet [id number] No Take 1 tablet (25 mg total) by mouth daily. Tysinger, Christiane Cowing, PA-C Taking Active               Assessment/Plan:   Diabetes: - Currently controlled  - Reviewed long term cardiovascular and renal outcomes of uncontrolled blood sugar - Reviewed goal A1c, goal fasting, and goal 2 hour post prandial glucose - Reviewed dietary modifications including low carb diet - Patient denies personal or family history of multiple endocrine neoplasia type 2, medullary thyroid  cancer; personal history of pancreatitis or gallbladder disease -Continue current medication therapy for now and counseled on diet. If sugars remain elevated above goal at PCP f/u in 2 weeks, consider re-addition of lantus  at 6 units or increasing Ozempic  to  2mg  -Contact us  sooner than next follow up for any questions or concerns.   Follow Up Plan: 04/05/24  Carnell Christian, PharmD Clinical Pharmacist [phone]

## 2024-03-05 ENCOUNTER — Ambulatory Visit (INDEPENDENT_AMBULATORY_CARE_PROVIDER_SITE_OTHER): Payer: Medicare HMO | Admitting: Medical

## 2024-03-05 VITALS — BP 110/66 | HR 72 | Wt 158.0 lb

## 2024-03-05 DIAGNOSIS — M810 Age-related osteoporosis without current pathological fracture: Secondary | ICD-10-CM | POA: Diagnosis not present

## 2024-03-05 DIAGNOSIS — H6121 Impacted cerumen, right ear: Secondary | ICD-10-CM

## 2024-03-05 DIAGNOSIS — H938X2 Other specified disorders of left ear: Secondary | ICD-10-CM

## 2024-03-05 DIAGNOSIS — E785 Hyperlipidemia, unspecified: Secondary | ICD-10-CM

## 2024-03-05 DIAGNOSIS — E1169 Type 2 diabetes mellitus with other specified complication: Secondary | ICD-10-CM | POA: Diagnosis not present

## 2024-03-05 DIAGNOSIS — Z794 Long term (current) use of insulin: Secondary | ICD-10-CM | POA: Diagnosis not present

## 2024-03-05 LAB — POCT GLYCOSYLATED HEMOGLOBIN (HGB A1C): Hemoglobin A1C: 6.9 % — AB (ref 4.0–5.6)

## 2024-03-05 NOTE — Progress Notes (Signed)
 Subjective:  Marissa Oliver is a 83 y.o. female who presents for Chief Complaint  Patient presents with   Medical Management of Chronic Issues    Diabetes check      Here for med check, med management  Diabetes-currently taking Ozempic  1 mg weekly, and currently 3 units of Lantus  daily.  Prior to 11/2023 was on 35 units Lantus , but then we added Ozempic  and she has lost weight and seen improvements in sugars.  She had my chart messaged in last month about lower readings, so we reduced lantus  down to 3 units.     Kidney protection-she continues on lisinopril  5 mg daily  Hyperlipidemia-continues on rosuvastatin  20 mg a day and aspirin  81 mg daily without complaint  Having some ear pressure on left.  No drainage, no recent injury, trauma or allergy problems.   No other aggravating or relieving factors.    No other c/o.  Past Medical History:  Diagnosis Date   Atrophic vaginitis    estrace  cream rx'd by Dr. Ottelin   Colon polyp    tubular adenoma (colonoscopy q5 yrs)   Diabetes mellitus 2005   Diverticulosis    Dyslipidemia    GAD (generalized anxiety disorder)    Hemorrhoids    Hyperlipidemia    OAB (overactive bladder)    Osteoporosis    Psoriasis    mild, sees [facility]   Current Outpatient Medications on File Prior to Visit  Medication Sig Dispense Refill   aspirin  EC 81 MG tablet Take 1 tablet (81 mg total) by mouth daily. 90 tablet 3   Continuous Glucose Sensor (FREESTYLE LIBRE 3 PLUS SENSOR) MISC Use as directed to continuously monitor blood sugars. Change sensor every 15 days. 6 each 3   Fiber POWD Take 1 Dose by mouth as needed.     gabapentin  (NEURONTIN ) 100 MG capsule TAKE 1 CAPSULE BY MOUTH AT BEDTIME 90 capsule 1   insulin  glargine (LANTUS ) 100 unit/mL SOPN Inject 10 Units into the skin daily. (Patient taking differently: Inject into the skin daily. 2-3 units)     latanoprost (XALATAN) 0.005 % ophthalmic solution SMARTSIG:In Eye(s)     LINZESS  290 MCG  CAPS capsule Take 1 capsule (290 mcg total) by mouth daily before breakfast. Take 1, Every morning before breakfast 30 capsule 3   lisinopril  (ZESTRIL ) 5 MG tablet Take 1 tablet (5 mg total) by mouth daily. 90 tablet 1   nitrofurantoin  (MACRODANTIN ) 100 MG capsule Take 100 mg by mouth daily.     omeprazole  (PRILOSEC) 40 MG capsule Take 1 capsule (40 mg total) by mouth every morning. Take 30 minutes before breakfast. 90 capsule 1   polyethylene glycol powder (GLYCOLAX /MIRALAX ) 17 GM/SCOOP powder Take 17 g by mouth daily.     rosuvastatin  (CRESTOR ) 20 MG tablet Take 1 tablet (20 mg total) by mouth daily. 90 tablet 1   Semaglutide , 1 MG/DOSE, 4 MG/3ML SOPN Inject 1 mg into the skin once a week. 3 mL 3   vitamin B-6 (PYRIDOXINE) 25 MG tablet Take 1 tablet (25 mg total) by mouth daily. 90 tablet 0   Blood Glucose Monitoring Suppl DEVI Test 1-2 times day. One touch ultra 2 1 each 0   Glucose Blood (BLOOD GLUCOSE TEST STRIPS) STRP Test 1-2 times daily 100 strip 1   Insulin  Pen Needle (B-D UF III MINI PEN NEEDLES) 31G X 5 MM MISC USE ONCE DAILY 100 each 5   Lancet Device MISC 1-2 times daily 1 each 0  Lancets Misc. MISC 1-2 times a day 100 each 1   OneTouch Delica Lancets 33G MISC 1 each by Does not apply route in the morning and at bedtime. Dx:E11.9 100 each 2   No current facility-administered medications on file prior to visit.     The following portions of the patient's history were reviewed and updated as appropriate: allergies, current medications, past family history, past medical history, past social history, past surgical history and problem list.  ROS Otherwise as in subjective above     Objective: BP 110/66   Pulse 72   Wt 158 lb (71.7 kg)   BMI 28.90 kg/m   BP Readings from Last 3 Encounters:  03/05/24 110/66  12/29/23 (!) 112/58  11/17/23 122/80   Wt Readings from Last 3 Encounters:  03/05/24 158 lb (71.7 kg)  12/29/23 164 lb (74.4 kg)  11/17/23 168 lb (76.2 kg)     General appearance: alert, no distress, well developed, well nourished HEENT: normocephalic, sclerae anicteric, conjunctiva pink and moist, left TM with decreased light reflex but no issue with cerumen, patent canal, right ear canal with impacted cerumen, otherwise nares patent, no discharge or erythema, pharynx normal Oral cavity: MMM, no lesions Neck: supple, no lymphadenopathy, no thyromegaly, no masses Heart: RRR, normal S1, S2, no murmurs Lungs: CTA bilaterally, no wheezes, rhonchi, or rales Pulses: 2+ radial pulses, 2+ pedal pulses, normal cap refill Ext: no edema    Assessment: Encounter Diagnoses  Name Primary?   Dyslipidemia associated with type 2 diabetes mellitus (HCC) Yes   Type 2 diabetes mellitus with other specified complication, with long-term current use of insulin  (HCC)    Ear pressure, left    Impacted cerumen of right ear    Osteoporosis, unspecified osteoporosis type, unspecified pathological fracture presence      Plan: Diabetes - reviewing her freestyle libre data, she has averaged 175 sugars last 7 days, and mainly around 150s for the past 90 days.  No hypoglycemia in last 90 days.   Hgba1C 6.9% today. Continue Ozempic /semaglutide  1 mg weekly injection.  She does not want to increase that dose today. Increase your long-acting insulin  Lantus  to 5 units daily Over the next 1 to 2 weeks if your blood sugars are not averaging around 100-130 fasting then you can increase Lantus  units by 2 units/week until you get your sugars to goal. So for example in the next 2 weeks you can go up from 5 units to 7 units if sugars not at goal We want to avoid any low sugar readings under 70 If you ever see a reading close to 70 or less then use the recommendations for hypoglycemia  Dyslipidemia - continue crestor  20mg  daily and aspirin  81 mg daily  Sees dermatology soon for surveillance  Sees nephrology soon for routine f/u, continue lisinopril  5 mg daily   Impacted  cerumen  Discussed findings.  Discussed risk/benefits of procedure and patient agrees to procedure. Successfully used warm water lavage to remove impacted cerumen from right ear canal. Patient tolerated procedure well. Advised they avoid using any cotton swabs or other devices to clean the ear canals.  Use basic hygiene as discussed.  Follow up prn.    Osteoporosis - consider other medication options.  We can check with the pharmacy team if you are willing to try something else.  She declines a new medication today.   She recently only got one injection of Prolia  and then the Prolia  fund ran out of money.  Has prior  failed Boniva  and Fosamax .   Recently we sent order for Evenity  but not approved by insurance.    Marissa Oliver was seen today for medical management of chronic issues.  Diagnoses and all orders for this visit:  Dyslipidemia associated with type 2 diabetes mellitus (HCC)  Type 2 diabetes mellitus with other specified complication, with long-term current use of insulin  (HCC) -     HgB A1c  Ear pressure, left  Impacted cerumen of right ear  Osteoporosis, unspecified osteoporosis type, unspecified pathological fracture presence    Follow up: 4-6 mo

## 2024-03-05 NOTE — Patient Instructions (Addendum)
 Hypoglycemia Hypoglycemia is when the amount of sugar, or glucose, in your blood is too low. Low blood sugar can happen if you have diabetes or if you don't have diabetes. It may be an emergency. What are the causes? Low blood sugar happens most often in people who have diabetes. It may be caused by: Diabetes medicine. Not eating enough, or not eating often enough. Being more active than normal. If you don't have diabetes, you may still get low blood sugar if: There's a tumor in your pancreas. A tumor is a growth of cells that isn't normal. You don't eat enough, or you fast. Fasting is when you don't eat for long periods at a time. You have a bad infection or illness. You have problems after weight loss surgery. You have kidney or liver problems. You take certain medicines. What increases the risk? You're more likely to have low blood sugar if: You have diabetes and take medicine for it. You drink a lot of alcohol. You get sick. What are the signs or symptoms? Mild Hunger or feeling like you may vomit. Sweating and feeling cold to the touch. Feeling dizzy or light-headed. Being sleepy or having trouble sleeping. A headache. Blurry vision. Mood changes. These include feeling worried, nervous, or easily annoyed. Moderate Feeling confused. Changes in the way you act. Weakness. An uneven heartbeat. Very bad Having very low blood sugar is an emergency. It can cause: Fainting. Seizures. A coma. Death. How is this diagnosed?  Low blood sugar can be found with a blood test. This test tells you how much sugar is in your blood. It's done while you're having symptoms. Your health care provider may also do an exam and look at your medical history. How is this treated? Treating low blood sugar If you have low blood sugar, eat or drink something with sugar in it right away. The food or drink should have 15 grams of a fast-acting carbohydrate (carb). Options include: 4 oz (120 mL) of  fruit juice. 4 oz (120 mL) of soda (not diet soda). A few pieces of hard candy. Check food labels to see how many pieces to eat. 1 Tbsp (15 mL) of sugar or honey. 4 glucose tablets. 1 tube of glucose gel. Treating low blood sugar if you have diabetes Talk with your provider about how much carb you should take. If you're alert and can swallow safely, you may follow the 15:15 rule: Take 15 grams of a fast-acting carb. Check your blood sugar 15 minutes after you take the carb. If your blood sugar is still at or below 70 mg/dL (3.9 mmol/L), take 15 grams of a carb again. If your blood sugar doesn't go above 70 mg/dL (3.9 mmol/L) after 3 tries, get help right away. After your blood sugar goes back to normal, eat a meal or a snack within 1 hour. Always keep 15 grams of a fast-acting carb with you. This could be: 4 glucose tablets. A few pieces of hard candy. 1 Tbsp (15 mL) of honey or sugar. 1 tube of glucose gel. Treating very low blood sugar If your blood sugar is less than 54 mg/dL (3 mmol/L), it's an emergency. Get help right away. If you can't eat or drink, you will need to be given glucagon. A family member or friend should learn how to check your blood sugar and give you glucagon. Ask your provider if you should keep a glucagon kit at home. You may also need to be treated in a hospital. Follow  these instructions at home: If you have diabetes: Always keep a fast-acting carb (15 grams) with you. Follow your diabetes care plan. Make sure you: Know the symptoms of low blood sugar. Check your blood sugar as often as told. Always check it before and after you exercise. Always check your blood sugar before you drive. Take your medicines as told. Eat on time. Do not skip meals. Share your diabetes care plan with: Your work or school. The people you live with. Wear an alert bracelet or carry a card that says you have diabetes. General instructions If you drink alcohol: Limit how much you  have to: 0-1 drink a day if you're female. 0-2 drinks a day if you're female. Know how much alcohol is in your drink. In the [place] one drink is one 12 oz bottle of beer (355 mL), one 5 oz glass of wine (148 mL), or one 1 oz glass of hard liquor (44 mL). Be sure to eat food when you drink alcohol. Be sure to check your blood sugar after you drink. Alcohol may lead to low blood sugar later. Where to find more information American Diabetes Association (ADA): diabetes.org Contact a health care provider if: You have low blood sugar often. You have diabetes and are having trouble keeping your blood sugar in the right range. Get help right away if: You can't get your blood sugar above 70 mg/dL (3.9 mmol/L) after 3 tries. Your blood sugar is below 54 mg/dL (3 mmol/L). You have a seizure. You faint. These symptoms may be an emergency. Call 911 right away. Do not wait to see if the symptoms will go away. Do not drive yourself to the hospital. This information is not intended to replace advice given to you by your health care provider. Make sure you discuss any questions you have with your health care provider. Document Revised: 07/21/2023 Document Reviewed: 01/05/2023 Elsevier Patient Education  2024 Elsevier Inc.         Diabetes - reviewing her freestyle [place] data, she has averaged 175 sugars last 7 days, and mainly around 150s for the past 90 days.  No hypoglycemia in last 90 days.   Hgba1C 6.9% today. Continue Ozempic /semaglutide  1 mg weekly injection.  She does not want to increase that dose today. Increase your long-acting insulin  Lantus  to 5 units daily Over the next 1 to 2 weeks if your blood sugars are not averaging around 100-130 fasting then you can increase Lantus  units to 2 units/week until you get your sugars to goal. So for example in the next 2 weeks you can go up from 5 units to 7 units if sugars not at goal We want to avoid any low sugar readings under 70 If you ever see  a reading close to 70 or less then use the recommendations for hypoglycemia  Dyslipidemia - continue crestor  20mg  daily and aspirin  81 mg daily  Sees dermatology soon for surveillance  Sees nephrology soon for routine f/u, continue lisinopril  5 mg daily   Impacted cerumen  Discussed findings.  Discussed risk/benefits of procedure and patient agrees to procedure. Successfully used warm water lavage to remove impacted cerumen from right ear canal. Patient tolerated procedure well. Advised they avoid using any cotton swabs or other devices to clean the ear canals.  Use basic hygiene as discussed.  Follow up prn.    Osteoporosis - consider other medication optoins.  We can check with the pharmacy team if you are willing to try something else.

## 2024-03-29 NOTE — Telephone Encounter (Signed)
 Yes this is Pt Assistance Dosage change was faxed today to [employer]

## 2024-04-05 ENCOUNTER — Other Ambulatory Visit

## 2024-04-12 ENCOUNTER — Other Ambulatory Visit (INDEPENDENT_AMBULATORY_CARE_PROVIDER_SITE_OTHER)

## 2024-04-12 DIAGNOSIS — E1169 Type 2 diabetes mellitus with other specified complication: Secondary | ICD-10-CM

## 2024-04-12 DIAGNOSIS — Z794 Long term (current) use of insulin: Secondary | ICD-10-CM

## 2024-04-12 NOTE — Progress Notes (Signed)
 04/12/2024 Name: Marissa Oliver MRN: [id number] DOB: Oct 21, 1941  Chief Complaint  Patient presents with   Diabetes   Medication Management    Marissa Oliver is a 83 y.o. year old female who presented via telephone today for phone call visit.   They were referred to the pharmacist by their PCP for assistance in managing diabetes and medication access.    Subjective:  Care Team: Primary Care Provider: Claudene Crystal, PA-C    Medication Access/Adherence  Current Pharmacy:  [id number] - [place], [place] - [address] [place] [address] Phone: [phone] Fax: [phone]   Patient reports affordability concerns with their medications: No  Patient reports access/transportation concerns to their pharmacy: No  Patient reports adherence concerns with their medications:  No     Diabetes:  Current medications: Ozempic  1mg  weekly on Sundays, Lantus  5 units Medications tried in the past: Farxiga , Levemir , Glyburide , Tresiba , Metformin , Janmet, Lantus   Current glucose readings:    Patient states she wasn't giving herself any lantus  the last 2 weeks because she thought she was suppose to be giving it 2-3 times a day for sugars above 150  Patient denies hypoglycemic s/sx including dizziness, shakiness, sweating. Patient denies hyperglycemic symptoms including polyuria, polydipsia, polyphagia, nocturia, neuropathy, blurred vision.  Current meal patterns:  Tries to be mindful of carbs but states she could be better, especially with the recent holiday   Current medication access support: Lantus  through [employer], Ozempic  through [employer]   Objective:  Lab Results  Component Value Date   HGBA1C 6.9 (A) 03/05/2024    Lab Results  Component Value Date   CREATININE 0.76 11/17/2023   BUN 17 11/17/2023   NA 142 11/17/2023   K 4.9 11/17/2023   CL 105 11/17/2023   CO2 22 11/17/2023    Lab Results  Component Value Date   CHOL 127  07/21/2023   HDL 52 07/21/2023   LDLCALC 52 07/21/2023   TRIG 132 07/21/2023   CHOLHDL 2.4 07/21/2023    Medications Reviewed Today     Reviewed by Carnell Christian, RPH (Pharmacist) on 04/12/24 at 1007  Med List Status: <None>   Medication Order Taking? Sig Documenting Provider Last Dose Status Informant  aspirin  EC 81 MG tablet [id number] Yes Take 1 tablet (81 mg total) by mouth daily. Tysinger, Christiane Cowing, PA-C  Active            Med Note Carnell Christian   Thu Dec 08, 2023  9:01 AM) qod  Blood Glucose Monitoring Suppl DEVI [id number]  Test 1-2 times day. One touch ultra 2 Tysinger, Christiane Cowing, PA-C  Active   Continuous Glucose Sensor (FREESTYLE LIBRE 3 PLUS SENSOR) [place] [id number]  Use as directed to continuously monitor blood sugars. Change sensor every 15 days. Claudene Crystal, PA-C  Active   Fiber POWD [id number] Yes Take 1 Dose by mouth as needed. [provider]  Active   gabapentin  (NEURONTIN ) 100 MG capsule [id number] Yes TAKE 1 CAPSULE BY MOUTH AT BEDTIME Claudene Crystal, PA-C  Active   Glucose Blood (BLOOD GLUCOSE TEST STRIPS) STRP [id number]  Test 1-2 times daily Claudene Crystal, PA-C  Active   insulin  glargine (LANTUS ) 100 unit/mL SOPN [id number]  Inject 10 Units into the skin daily.  Patient taking differently: Inject into the skin daily. 2-3 units   Tysinger, Christiane Cowing, PA-C  Active            Med Note Lincoln Renshaw,  SABRINA A   Mon Mar 05, 2024 10:48 AM)    Insulin  Pen Needle (B-D UF III MINI PEN NEEDLES) 31G X 5 MM MISC [id number]  USE ONCE DAILY Garner Jury  Active   Lancet Device MISC [id number]  1-2 times daily Tysinger, Christiane Cowing, PA-C  Active   Lancets Misc. MISC [id number]  1-2 times a day Claudene Crystal, PA-C  Active   latanoprost (XALATAN) 0.005 % ophthalmic solution [id number] Yes SMARTSIG:In Eye(s) [provider]  Active   LINZESS  290 MCG CAPS capsule [id number] Yes Take 1 capsule (290 mcg total) by mouth daily before breakfast. Take 1, Every  morning before breakfast Arlee Bellows, NP  Active            Med Note Zilphia Hilt, Saddie Sandeen H   Thu Apr 12, 2024  9:34 AM)    lisinopril  (ZESTRIL ) 5 MG tablet [id number] Yes Take 1 tablet (5 mg total) by mouth daily. Tysinger, Christiane Cowing, PA-C  Active   nitrofurantoin  (MACRODANTIN ) 100 MG capsule [id number] Yes Take 100 mg by mouth daily. [provider]  Active   omeprazole  (PRILOSEC) 40 MG capsule [id number] Yes Take 1 capsule (40 mg total) by mouth every morning. Take 30 minutes before breakfast. Tory Freiberg, NP  Active   OneTouch Delica Lancets 33G MISC [id number]  1 each by Does not apply route in the morning and at bedtime. Dx:E11.9 Tysinger, Christiane Cowing, PA-C  Active   polyethylene glycol powder (GLYCOLAX /MIRALAX ) 17 GM/SCOOP powder [id number] Yes Take 17 g by mouth daily. [provider]  Active   rosuvastatin  (CRESTOR ) 20 MG tablet [id number] Yes Take 1 tablet (20 mg total) by mouth daily. Tysinger, Christiane Cowing, PA-C  Active   Semaglutide , 1 MG/DOSE, 4 MG/3ML SOPN [id number] Yes Inject 1 mg into the skin once a week. Tysinger, Christiane Cowing, PA-C  Active   vitamin B-6 (PYRIDOXINE) 25 MG tablet [id number] Yes Take 1 tablet (25 mg total) by mouth daily. Tysinger, Christiane Cowing, PA-C  Active               Assessment/Plan:   Diabetes: - Currently controlled per last A1c but libreview shows elevated readings - Reviewed long term cardiovascular and renal outcomes of uncontrolled blood sugar - Reviewed goal A1c, goal fasting, and goal 2 hour post prandial glucose - Reviewed dietary modifications including low carb diet - Patient denies personal or family history of multiple endocrine neoplasia type 2, medullary thyroid  cancer; personal history of pancreatitis or gallbladder disease -Resume lantus  5 units once daily as previously instructed by PCP. Contact our office sooner than scheduled follow up if this results in low BG -Contact us  sooner than next follow up for any questions or  concerns.   Follow Up Plan: 04/26/24  Carnell Christian, PharmD Clinical Pharmacist [phone]

## 2024-04-13 NOTE — Telephone Encounter (Signed)
 PAP Lantus  received, left message for pt

## 2024-04-13 NOTE — Telephone Encounter (Signed)
Pt informed

## 2024-04-16 NOTE — Telephone Encounter (Unsigned)
 Copied from CRM [id number]. Topic: Clinical - Medication Question >> Apr 16, 2024 12:36 PM Donald Frost wrote: Reason for CRM: The patient called in because she was having difficulty getting into her my chart to see what her provider said about her blood sugar and her medications. I read her the message from Dr Monnie Anthony and she understood and she said if she has any questions or problems she will call back.

## 2024-04-16 NOTE — Telephone Encounter (Signed)
 Please advise since Jimmye Moulds is out of the office.

## 2024-04-18 NOTE — Telephone Encounter (Signed)
 PAP Ozempic  2mg  received, pt informed

## 2024-04-20 DIAGNOSIS — D225 Melanocytic nevi of trunk: Secondary | ICD-10-CM | POA: Diagnosis not present

## 2024-04-20 DIAGNOSIS — L400 Psoriasis vulgaris: Secondary | ICD-10-CM | POA: Diagnosis not present

## 2024-04-20 DIAGNOSIS — L570 Actinic keratosis: Secondary | ICD-10-CM | POA: Diagnosis not present

## 2024-04-20 DIAGNOSIS — L814 Other melanin hyperpigmentation: Secondary | ICD-10-CM | POA: Diagnosis not present

## 2024-04-20 DIAGNOSIS — L821 Other seborrheic keratosis: Secondary | ICD-10-CM | POA: Diagnosis not present

## 2024-04-25 ENCOUNTER — Ambulatory Visit: Admitting: Nurse Practitioner

## 2024-04-26 ENCOUNTER — Other Ambulatory Visit

## 2024-04-26 DIAGNOSIS — Z794 Long term (current) use of insulin: Secondary | ICD-10-CM

## 2024-04-26 DIAGNOSIS — E1169 Type 2 diabetes mellitus with other specified complication: Secondary | ICD-10-CM

## 2024-04-26 NOTE — Progress Notes (Signed)
 04/26/2024 Name: Marissa Oliver MRN: [id number] DOB: 09-Jun-1941  Chief Complaint  Patient presents with   Medication Management   Diabetes    Marissa Oliver is a 83 y.o. year old female who presented via telephone today for phone call visit.   They were referred to the pharmacist by their PCP for assistance in managing diabetes and medication access.    Subjective:  Care Team: Primary Care Provider: Bulah Alm RAMAN, PA-C    Medication Access/Adherence  Current Pharmacy:  [id number] - [place], [place] - [address] [place] [address] Phone: [phone] Fax: [phone]   Patient reports affordability concerns with their medications: No  Patient reports access/transportation concerns to their pharmacy: No  Patient reports adherence concerns with their medications:  No     Diabetes:  Current medications: Ozempic  1mg  weekly on Sundays, Lantus  5 units Medications tried in the past: Farxiga , Levemir , Glyburide , Tresiba , Metformin , Janmet, Lantus   Current glucose readings:     Patient denies hypoglycemic s/sx including dizziness, shakiness, sweating. Patient denies hyperglycemic symptoms including polyuria, polydipsia, polyphagia, nocturia, neuropathy, blurred vision.  Current meal patterns:  Tries to be mindful of carbs but states she could be better   Current medication access support: Lantus  through [employer], Ozempic  through [facility]  Has picked up Ozempic  2mg  but would like to finish out her supply of 1mg  before increasing if possible.   Objective:  Lab Results  Component Value Date   HGBA1C 6.9 (A) 03/05/2024    Lab Results  Component Value Date   CREATININE 0.76 11/17/2023   BUN 17 11/17/2023   NA 142 11/17/2023   K 4.9 11/17/2023   CL 105 11/17/2023   CO2 22 11/17/2023    Lab Results  Component Value Date   CHOL 127 07/21/2023   HDL 52 07/21/2023   LDLCALC 52 07/21/2023   TRIG 132 07/21/2023   CHOLHDL  2.4 07/21/2023    Medications Reviewed Today     Reviewed by [name], RPH (Pharmacist) on 04/26/24 at 1338  Med List Status: <None>   Medication Order Taking? Sig Documenting Provider Last Dose Status Informant  aspirin  EC 81 MG tablet [id number]  Take 1 tablet (81 mg total) by mouth daily. Tysinger, Alm RAMAN, PA-C  Active            Med Note [name]   Thu Dec 08, 2023  9:01 AM) qod  Blood Glucose Monitoring Suppl DEVI [id number]  Test 1-2 times day. One touch ultra 2 Tysinger, Alm RAMAN, PA-C  Active   Continuous Glucose Sensor (FREESTYLE LIBRE 3 PLUS SENSOR) [place] [id number]  Use as directed to continuously monitor blood sugars. Change sensor every 15 days. Tysinger, [name], PA-C  Active   Fiber POWD [id number]  Take 1 Dose by mouth as needed. [provider]  Active   gabapentin  (NEURONTIN ) 100 MG capsule [id number]  TAKE 1 CAPSULE BY MOUTH AT BEDTIME Bulah Alm RAMAN, PA-C  Active   Glucose Blood (BLOOD GLUCOSE TEST STRIPS) STRP [id number]  Test 1-2 times daily Bulah Alm RAMAN, PA-C  Active   insulin  glargine (LANTUS ) 100 unit/mL SOPN [id number]  Inject 10 Units into the skin daily.  Patient taking differently: Inject into the skin daily. 2-3 units   Tysinger, Alm RAMAN, PA-C  Active            Med Note ABE, SABRINA A   Mon Mar 05, 2024 10:48 AM)    Insulin  Pen  Needle (B-D UF III MINI PEN NEEDLES) 31G X 5 MM MISC [id number]  USE ONCE DAILY Bulah Alm GORMAN [name]  Active   Lancet Device MISC [id number]  1-2 times daily Tysinger, Alm GORMAN, PA-C  Active   Lancets Misc. MISC [id number]  1-2 times a day Bulah Alm GORMAN, PA-C  Active   latanoprost (XALATAN) 0.005 % ophthalmic solution [id number]  SMARTSIG:In Eye(s) [provider]  Active   LINZESS  290 MCG CAPS capsule [id number]  Take 1 capsule (290 mcg total) by mouth daily before breakfast. Take 1, Every morning before breakfast [name] [name], NP  Active            Med Note JANEAN, Chrishauna Mee H   Thu Apr 12, 2024  9:34 AM)    lisinopril  (ZESTRIL ) 5 MG tablet [id number]  Take 1 tablet (5 mg total) by mouth daily. Tysinger, Alm GORMAN, PA-C  Active   nitrofurantoin  (MACRODANTIN ) 100 MG capsule [id number]  Take 100 mg by mouth daily. [provider]  Active   omeprazole  (PRILOSEC) 40 MG capsule [id number]  Take 1 capsule (40 mg total) by mouth every morning. Take 30 minutes before breakfast. Cara Elida [name], NP  Active   OneTouch Delica Lancets 33G MISC [id number]  1 each by Does not apply route in the morning and at bedtime. Dx:E11.9 Tysinger, Alm GORMAN, PA-C  Active   polyethylene glycol powder (GLYCOLAX /MIRALAX ) 17 GM/SCOOP powder [id number]  Take 17 g by mouth daily. [provider]  Active   rosuvastatin  (CRESTOR ) 20 MG tablet [id number]  Take 1 tablet (20 mg total) by mouth daily. Tysinger, Alm GORMAN, PA-C  Active   Semaglutide , 1 MG/DOSE, 4 MG/3ML SOPN [id number]  Inject 1 mg into the skin once a week. Tysinger, Alm GORMAN, PA-C  Active   vitamin B-6 (PYRIDOXINE) 25 MG tablet [id number]  Take 1 tablet (25 mg total) by mouth daily. Tysinger, Alm GORMAN, PA-C  Active               Assessment/Plan:   Diabetes: - Currently controlled per last A1c but libreview shows elevated readings and A1C of 7.4 if sugars continue at this level - Reviewed long term cardiovascular and renal outcomes of uncontrolled blood sugar - Reviewed goal A1c, goal fasting, and goal 2 hour post prandial glucose - Reviewed dietary modifications including low carb diet - Patient denies personal or family history of multiple endocrine neoplasia type 2, medullary thyroid  cancer; personal history of pancreatitis or gallbladder disease -INCREASE lantus  to 7 units once daily at bedtime. Continue other current medications -Contact us  sooner than next follow up for any questions or concerns.   Follow Up Plan: 05/10/24  [name], PharmD Clinical Pharmacist [phone]

## 2024-05-08 ENCOUNTER — Other Ambulatory Visit: Payer: Self-pay | Admitting: Medical

## 2024-05-10 ENCOUNTER — Other Ambulatory Visit (INDEPENDENT_AMBULATORY_CARE_PROVIDER_SITE_OTHER)

## 2024-05-10 ENCOUNTER — Other Ambulatory Visit

## 2024-05-10 DIAGNOSIS — Z794 Long term (current) use of insulin: Secondary | ICD-10-CM

## 2024-05-10 DIAGNOSIS — E1169 Type 2 diabetes mellitus with other specified complication: Secondary | ICD-10-CM

## 2024-05-10 NOTE — Progress Notes (Signed)
 05/10/2024 Name: Marissa Oliver MRN: [id number] DOB: Sep 06, 1941  Chief Complaint  Patient presents with   Medication Management   Diabetes    AHRIA SLAPPEY is a 83 y.o. year old female who presented via telephone today for phone call visit.   They were referred to the pharmacist by their PCP for assistance in managing diabetes and medication access.    Subjective:  Care Team: Primary Care Provider: Bulah Alm RAMAN, PA-C    Medication Access/Adherence  Current Pharmacy:  [id number] - [place], [place] - [address] [place] [address] Phone: [phone] Fax: [phone]   Patient reports affordability concerns with their medications: No  Patient reports access/transportation concerns to their pharmacy: No  Patient reports adherence concerns with their medications:  No     Diabetes:  Current medications: Ozempic  1mg  weekly on Sundays, Lantus  7 units Medications tried in the past: Farxiga , Levemir , Glyburide , Tresiba , Metformin , Janmet, Lantus   Current glucose readings:     Patient denies hypoglycemic s/sx including dizziness, shakiness, sweating. Patient denies hyperglycemic symptoms including polyuria, polydipsia, polyphagia, nocturia, neuropathy, blurred vision.  Current meal patterns:  Tries to be mindful of carbs but states she could be better   Current medication access support: Lantus  through [employer], Ozempic  through [facility]  Has picked up Ozempic  2mg  but would like to finish out her supply of 1mg  before increasing if possible.   Objective:  Lab Results  Component Value Date   HGBA1C 6.9 (A) 03/05/2024    Lab Results  Component Value Date   CREATININE 0.76 11/17/2023   BUN 17 11/17/2023   NA 142 11/17/2023   K 4.9 11/17/2023   CL 105 11/17/2023   CO2 22 11/17/2023    Lab Results  Component Value Date   CHOL 127 07/21/2023   HDL 52 07/21/2023   LDLCALC 52 07/21/2023   TRIG 132 07/21/2023   CHOLHDL  2.4 07/21/2023    Medications Reviewed Today     Reviewed by [name], RPH (Pharmacist) on 05/10/24 at 1514  Med List Status: <None>   Medication Order Taking? Sig Documenting Provider Last Dose Status Informant  aspirin  EC 81 MG tablet [id number] Yes Take 1 tablet (81 mg total) by mouth daily. Tysinger, Alm RAMAN, PA-C  Active            Med Note [name]   Thu Dec 08, 2023  9:01 AM) qod  Blood Glucose Monitoring Suppl DEVI [id number]  Test 1-2 times day. One touch ultra 2 Tysinger, Alm RAMAN, PA-C  Active   Continuous Glucose Sensor (FREESTYLE LIBRE 3 PLUS SENSOR) MISC [id number] Yes Use as directed to continuously monitor blood sugars. Change sensor every 15 days. Bulah Alm RAMAN, PA-C  Active   Fiber POWD [id number] Yes Take 1 Dose by mouth as needed. [provider]  Active   gabapentin  (NEURONTIN ) 100 MG capsule [id number] Yes TAKE 1 CAPSULE BY MOUTH AT BEDTIME Bulah Alm RAMAN, PA-C  Active   Glucose Blood (BLOOD GLUCOSE TEST STRIPS) STRP [id number]  Test 1-2 times daily Bulah Alm RAMAN, PA-C  Active   insulin  glargine (LANTUS ) 100 unit/mL SOPN [id number] Yes Inject 10 Units into the skin daily. Tysinger, Alm RAMAN, PA-C  Active            Med Note ABE, SABRINA A   Mon Mar 05, 2024 10:48 AM)    Insulin  Pen Needle (B-D UF III MINI PEN NEEDLES) 31G X 5 MM MISC [id number]  USE ONCE DAILY Tysinger, Alm GORMAN RIGGERS  Active   Lancet Device MISC [id number]  1-2 times daily Tysinger, Alm GORMAN, PA-C  Active   Lancets Misc. MISC [id number]  1-2 times a day Bulah Alm GORMAN, PA-C  Active   latanoprost (XALATAN) 0.005 % ophthalmic solution [id number] Yes SMARTSIG:In Eye(s) [provider]  Active   LINZESS  290 MCG CAPS capsule [id number] Yes Take 1 capsule (290 mcg total) by mouth daily before breakfast. Take 1, Every morning before breakfast [name] [name], NP  Active            Med Note JANEAN, Lanny Lipkin H   Thu Apr 12, 2024  9:34 AM)    lisinopril  (ZESTRIL ) 5 MG  tablet [id number] Yes Take 1 tablet (5 mg total) by mouth daily. Tysinger, Alm GORMAN, PA-C  Active   nitrofurantoin  (MACRODANTIN ) 100 MG capsule [id number] Yes Take 100 mg by mouth daily. [provider]  Active   omeprazole  (PRILOSEC) 40 MG capsule [id number] Yes Take 1 capsule (40 mg total) by mouth every morning. Take 30 minutes before breakfast. Cara Elida [name], NP  Active   OneTouch Delica Lancets 33G MISC [id number]  1 each by Does not apply route in the morning and at bedtime. Dx:E11.9 Tysinger, Alm GORMAN, PA-C  Active   polyethylene glycol powder (GLYCOLAX /MIRALAX ) 17 GM/SCOOP powder [id number] Yes Take 17 g by mouth daily. [provider]  Active   rosuvastatin  (CRESTOR ) 20 MG tablet [id number] Yes Take 1 tablet (20 mg total) by mouth daily. Tysinger, Alm GORMAN, PA-C  Active   Semaglutide , 1 MG/DOSE, 4 MG/3ML SOPN [id number] Yes Inject 1 mg into the skin once a week. Tysinger, Alm GORMAN, PA-C  Active   vitamin B-6 (PYRIDOXINE) 25 MG tablet [id number] Yes Take 1 tablet (25 mg total) by mouth daily. Tysinger, Alm GORMAN, PA-C  Active               Assessment/Plan:   Diabetes: - Currently controlled  - Reviewed long term cardiovascular and renal outcomes of uncontrolled blood sugar - Reviewed goal A1c, goal fasting, and goal 2 hour post prandial glucose - Reviewed dietary modifications including low carb diet - Patient denies personal or family history of multiple endocrine neoplasia type 2, medullary thyroid  cancer; personal history of pancreatitis or gallbladder disease -Continue lantus  7 units daily, ozempic  1mg  (will switch to 2mg  once she runs out of 1mg , may need to decrease lantus  at that time) -Contact us  sooner than next follow up for any questions or concerns.   Follow Up Plan: 07/05/24  [name], PharmD Clinical Pharmacist [phone]

## 2024-05-29 ENCOUNTER — Ambulatory Visit (INDEPENDENT_AMBULATORY_CARE_PROVIDER_SITE_OTHER): Admitting: Medical

## 2024-05-29 VITALS — BP 120/70 | HR 69 | Temp 97.8°F | Wt 153.2 lb

## 2024-05-29 DIAGNOSIS — Z794 Long term (current) use of insulin: Secondary | ICD-10-CM | POA: Diagnosis not present

## 2024-05-29 DIAGNOSIS — S0003XA Contusion of scalp, initial encounter: Secondary | ICD-10-CM

## 2024-05-29 DIAGNOSIS — I739 Peripheral vascular disease, unspecified: Secondary | ICD-10-CM | POA: Diagnosis not present

## 2024-05-29 DIAGNOSIS — E1169 Type 2 diabetes mellitus with other specified complication: Secondary | ICD-10-CM

## 2024-05-29 DIAGNOSIS — S40021A Contusion of right upper arm, initial encounter: Secondary | ICD-10-CM

## 2024-05-29 DIAGNOSIS — W19XXXA Unspecified fall, initial encounter: Secondary | ICD-10-CM | POA: Diagnosis not present

## 2024-05-29 DIAGNOSIS — M25561 Pain in right knee: Secondary | ICD-10-CM

## 2024-05-29 NOTE — Progress Notes (Signed)
 Subjective:  Marissa Oliver is a 83 y.o. female who presents for Chief Complaint  Patient presents with   Acute Visit    Clemens 7/26 out of bed while she was having a nightmare and hit head, right elbow and right knee pain.      Here for fall out of bed 3 days ago.  She notes she was having nightmare, ended up rolling out of bed on right side. Landed on right arm and leg.  Hit back of head.  Got up soon after the fall. But since then has noted skinned up right elbow and right knee, right forearm bruising and pain.   Bruised up back of right scalp. No headache though.  No blurred vision, no headache.  Using some ibuprofen for pain.  No other aggravating or relieving factors.    Diabetes - compliant with ozempic  1mg  weekly and Lantus  7 u daily.  Sugars looking ok.  Her sensor does sometimes showing the signal that is not connected to the phone.  No recent hypoglycemic episodes.  Uses the freestyle libre  Not taking aspirin  daily.  No other c/o.  Past Medical History:  Diagnosis Date   Atrophic vaginitis    estrace  cream rx'd by Dr. Ottelin   Colon polyp    tubular adenoma (colonoscopy q5 yrs)   Diabetes mellitus 2005   Diverticulosis    Dyslipidemia    GAD (generalized anxiety disorder)    Hemorrhoids    Hyperlipidemia    OAB (overactive bladder)    Osteoporosis    Psoriasis    mild, sees [facility]   Current Outpatient Medications on File Prior to Visit  Medication Sig Dispense Refill   aspirin  EC 81 MG tablet Take 1 tablet (81 mg total) by mouth daily. 90 tablet 3   Continuous Glucose Sensor (FREESTYLE LIBRE 3 PLUS SENSOR) MISC Use as directed to continuously monitor blood sugars. Change sensor every 15 days. 6 each 3   gabapentin  (NEURONTIN ) 100 MG capsule TAKE 1 CAPSULE BY MOUTH AT BEDTIME 90 capsule 1   insulin  glargine (LANTUS ) 100 unit/mL SOPN Inject 10 Units into the skin daily.     latanoprost (XALATAN) 0.005 % ophthalmic solution SMARTSIG:In Eye(s)     LINZESS  290  MCG CAPS capsule Take 1 capsule (290 mcg total) by mouth daily before breakfast. Take 1, Every morning before breakfast 30 capsule 3   lisinopril  (ZESTRIL ) 5 MG tablet Take 1 tablet (5 mg total) by mouth daily. 90 tablet 1   omeprazole  (PRILOSEC) 40 MG capsule Take 1 capsule (40 mg total) by mouth every morning. Take 30 minutes before breakfast. 90 capsule 1   polyethylene glycol powder (GLYCOLAX /MIRALAX ) 17 GM/SCOOP powder Take 17 g by mouth daily.     rosuvastatin  (CRESTOR ) 20 MG tablet Take 1 tablet (20 mg total) by mouth daily. 90 tablet 1   Semaglutide , 1 MG/DOSE, 4 MG/3ML SOPN Inject 1 mg into the skin once a week. 3 mL 3   vitamin B-6 (PYRIDOXINE) 25 MG tablet Take 1 tablet (25 mg total) by mouth daily. 90 tablet 0   Blood Glucose Monitoring Suppl DEVI Test 1-2 times day. One touch ultra 2 1 each 0   Fiber POWD Take 1 Dose by mouth as needed.     Glucose Blood (BLOOD GLUCOSE TEST STRIPS) STRP Test 1-2 times daily 100 strip 1   Insulin  Pen Needle (B-D UF III MINI PEN NEEDLES) 31G X 5 MM MISC USE ONCE DAILY 100 each 5  Lancet Device MISC 1-2 times daily 1 each 0   Lancets Misc. MISC 1-2 times a day 100 each 1   OneTouch Delica Lancets 33G MISC 1 each by Does not apply route in the morning and at bedtime. Dx:E11.9 100 each 2   No current facility-administered medications on file prior to visit.     The following portions of the patient's history were reviewed and updated as appropriate: allergies, current medications, past family history, past medical history, past social history, past surgical history and problem list.  ROS Otherwise as in subjective above    Objective: BP 120/70   Pulse 69   Temp 97.8 F (36.6 C)   Wt 153 lb 3.2 oz (69.5 kg)   BMI 28.02 kg/m   Wt Readings from Last 3 Encounters:  05/29/24 153 lb 3.2 oz (69.5 kg)  03/05/24 158 lb (71.7 kg)  12/29/23 164 lb (74.4 kg)   General appearance: alert, no distress, well developed, well nourished Right forearm  midshaft with 4 cm x 3 cm rectangular area of reddish-purple coloration of the skin secondary to either senile purpura or her recent fall and bruising Right posterior occipital scalp with mildly tender small 2 cm area of swelling from recent bruise but no discoloration, no open wound or bleeding Neck nontender with normal range of motion There is a small 8 mm diameter abrasion on the right posterior elbow, otherwise elbow nontender with normal range of motion Legs without obvious deformity or bruising, normal range of motion Arms and legs neurovascularly intact Pulses: 2+ radial pulses, 1+ pedal pulses, pinkish-purple distal feet and toes on both feet, cap refill a little reduced of toes in general Ext: no edema Psych: Pleasant, good eye contact, answers questions appropriately    Assessment: Encounter Diagnoses  Name Primary?   Fall, initial encounter Yes   Contusion of right upper extremity, initial encounter    Acute pain of right knee    Contusion of scalp, initial encounter    Type 2 diabetes mellitus with other specified complication, with long-term current use of insulin  (HCC)    PAD (peripheral artery disease) (HCC)      Plan: We discussed her fall and mild contusions to her right elbow and right scalp posteriorly.  Advised relative rest, stretching, can use cold therapy such as bag of ice water for 15 minutes each up to 3 times daily for the areas of bruising on the back of the head, back of right elbow and right knee.  Caution-put a cloth between skin cold therapy  She can use Motrin or Tylenol  for pain.  She uses aspirin  for peripheral arterial disease, she will cut back short-term for a few days given the right arm bruising.  Advise relative rest in general.  Symptoms should gradually improve.  Consider bed rails.  Her daughter apparently just purchased her some bed rails to install and begin using  Continue medications as usual otherwise.  [name] was seen today for acute  visit.  Diagnoses and all orders for this visit:  Fall, initial encounter  Contusion of right upper extremity, initial encounter  Acute pain of right knee  Contusion of scalp, initial encounter  Type 2 diabetes mellitus with other specified complication, with long-term current use of insulin  (HCC)  PAD (peripheral artery disease) (HCC)    Follow up: 3 months for physical

## 2024-05-30 ENCOUNTER — Telehealth: Payer: Self-pay

## 2024-05-30 NOTE — Telephone Encounter (Signed)
 Received a request for a refill from [facility] on Ozempic , fill and fax provider portion refill reorder form to provider office to sign and date and fax it to [facility] at [phone].

## 2024-06-05 ENCOUNTER — Other Ambulatory Visit: Payer: Self-pay | Admitting: Medical

## 2024-06-05 DIAGNOSIS — E118 Type 2 diabetes mellitus with unspecified complications: Secondary | ICD-10-CM

## 2024-06-05 MED ORDER — LISINOPRIL 5 MG PO TABS: 5.0000 mg | ORAL_TABLET | Freq: Every day | ORAL | 1 refills | Status: DC

## 2024-06-05 NOTE — Telephone Encounter (Signed)
Already filled this today

## 2024-06-14 NOTE — Progress Notes (Signed)
 Ellouise Console, PA-C [address] [place], [place]  [address] Phone: [phone]   Primary Care Physician: Bulah Alm RAMAN, PA-C  Primary Gastroenterologist:  Ellouise Console, PA-C / Elspeth Naval, MD   Chief Complaint: Follow-up constipation, Abdominal Pain, Weight Loss       HPI:   Marissa Oliver is a 83 y.o. female, established patient [facility], returns for follow-up of chronic constipation.  Last seen in our office 12/2023 by Elida Shawl, NP.  She is currently taking Linzess  290 mcg daily which works well.  This has been refilled through her PCP.  Provided by [employer] patient assistance program.  Current symptoms: Patient is concerned about a 15 pound unintentional weight loss in the past few months.  She has been having some LUQ and LLQ abdominal pain which comes and goes.  She is worried about cancer.  She takes Ozempic .  She reports having a good appetite and eats well.  Currently has 2-5 bowel movements daily.  She has not had any diarrhea.  Has occasional rectal pain but no rectal bleeding.  Occasionally passes yellow mucus in her stool.  Patient reports she had 2 cousins who died from pancreatic cancer.  She thinks her brother may also have had pancreatic cancer, uncertain.  08/2010 last colonoscopy by Dr. Kristie: 11 small tubular adenoma and hyperplastic polyps removed.  No further colonoscopies due to advanced age.  Abdominal pelvic CT in 2012 showed no acute abnormality.  Last pelvic CT in 2017 showed no acute abnormality.  She has not had any recent abdominal imaging.  She had previous hysterectomy.  11/2023 last labs: Normal CBC and CMP.   08/23/2023 08/28/2023 11/17/2023 12/29/2023 03/05/2024  Weight /BMI       Weight 169 lb  169 lb 1.5 oz  168 lb  164 lb  158 lb   Height  5' 2 (1.575 m)        05/29/2024 06/15/2024  Weight /BMI    Weight 153 lb 3.2 oz  149 lb   Height  5' (1.524 m)   BMI 28.02 kg/m2  29.1 kg/m2     Legend: (PV) Data saved with a  previous flowsheet row definition  Current Outpatient Medications  Medication Sig Dispense Refill   aspirin  EC 81 MG tablet Take 1 tablet (81 mg total) by mouth daily. 90 tablet 3   Blood Glucose Monitoring Suppl DEVI Test 1-2 times day. One touch ultra 2 1 each 0   Continuous Glucose Sensor (FREESTYLE LIBRE 3 PLUS SENSOR) MISC Use as directed to continuously monitor blood sugars. Change sensor every 15 days. 6 each 3   Fiber POWD Take 1 Dose by mouth as needed.     gabapentin  (NEURONTIN ) 100 MG capsule TAKE 1 CAPSULE BY MOUTH AT BEDTIME 90 capsule 1   Glucose Blood (BLOOD GLUCOSE TEST STRIPS) STRP Test 1-2 times daily 100 strip 1   insulin  glargine (LANTUS ) 100 unit/mL SOPN Inject 10 Units into the skin daily. (Patient taking differently: Inject 7 Units into the skin daily.)     Insulin  Pen Needle (B-D UF III MINI PEN NEEDLES) 31G X 5 MM MISC USE ONCE DAILY 100 each 5   Lancet Device MISC 1-2 times daily 1 each 0   Lancets Misc. MISC 1-2 times a day 100 each 1   latanoprost (XALATAN) 0.005 % ophthalmic solution SMARTSIG:In Eye(s)     lisinopril  (ZESTRIL ) 5 MG tablet Take 1 tablet (5 mg total) by mouth daily. 90 tablet  1   nitrofurantoin , macrocrystal-monohydrate, (MACROBID ) 100 MG capsule Take 100 mg by mouth daily.     omeprazole  (PRILOSEC) 40 MG capsule Take 1 capsule (40 mg total) by mouth every morning. Take 30 minutes before breakfast. 90 capsule 1   OneTouch Delica Lancets 33G MISC 1 each by Does not apply route in the morning and at bedtime. Dx:E11.9 100 each 2   polyethylene glycol powder (GLYCOLAX /MIRALAX ) 17 GM/SCOOP powder Take 17 g by mouth daily.     rosuvastatin  (CRESTOR ) 20 MG tablet Take 1 tablet (20 mg total) by mouth daily. 90 tablet 1   Semaglutide , 1 MG/DOSE, 4 MG/3ML SOPN Inject 1 mg into the skin once a week. 3 mL 3   vitamin B-6 (PYRIDOXINE) 25 MG tablet Take 1 tablet (25 mg total) by mouth daily. 90 tablet 0   LINZESS  290 MCG CAPS capsule Take 1 capsule (290 mcg total)  by mouth daily before breakfast. Take 1, Every morning before breakfast 90 capsule 1   No current facility-administered medications for this visit.    Allergies as of 06/15/2024 - Review Complete 06/15/2024  Allergen Reaction Noted   Farxiga  [dapagliflozin ]  02/02/2022    Past Medical History:  Diagnosis Date   Atrophic vaginitis    estrace  cream rx'd by Dr. Ottelin   Colon polyp    tubular adenoma (colonoscopy q5 yrs)   Diabetes mellitus 2005   Diverticulosis    Dyslipidemia    GAD (generalized anxiety disorder)    Hemorrhoids    Hyperlipidemia    OAB (overactive bladder)    Osteoporosis    Psoriasis    mild, sees [facility]    Past Surgical History:  Procedure Laterality Date   ABDOMINAL HYSTERECTOMY  11/01/1970   partial   APPENDECTOMY     CARDIOVASCULAR STRESS TEST  12/25/2004   EF 65%. OVERALL NORMAL STRESS CARDIOLITE. NO EVIDENCE  OF ISCHEMIA   COLONOSCOPY  08/01/2010   cologuard 01/2016 normal   CYSTOSCOPY  03/2023   At [facility]   PUBOVAGINAL SLING  04/01/1998   TUBAL LIGATION      Review of Systems:    All systems reviewed and negative except where noted in HPI.    Physical Exam:  BP 104/62   Pulse 88   Ht 5' (1.524 m)   Wt 149 lb (67.6 kg)   BMI 29.10 kg/m  No LMP recorded. Patient is postmenopausal.  General: Well-nourished, well-developed in no acute distress.  Lungs: Clear to auscultation bilaterally. Non-labored. Heart: Regular rate and rhythm, no murmurs rubs or gallops.  Abdomen: Bowel sounds are normal; Abdomen is Soft; No hepatosplenomegaly, masses or hernias; Mild LUQ and LLQ abdominal Tenderness.  No right side abdominal tenderness.; No guarding or rebound tenderness. Rectal: No external hemorrhoids or fissures.  Internal rectal exam normal.  Mild weak anal sphincter tone.  No rectal masses or tenderness. Neuro: Alert and oriented x 3.  Grossly intact.  Psych: Alert and cooperative, normal mood and affect.  Chaperone  for Exam:  Patty [place], CMA   Imaging Studies: No results found.  Labs: CBC    Component Value Date/Time   WBC 6.7 11/17/2023 1153   WBC 4.8 05/18/2017 0842   RBC 4.29 11/17/2023 1153   RBC 4.48 05/18/2017 0842   HGB 12.9 11/17/2023 1153   HCT 38.6 11/17/2023 1153   PLT 178 11/17/2023 1153   MCV 90 11/17/2023 1153   MCH 30.1 11/17/2023 1153   MCH 29.7 05/18/2017 0842   MCHC 33.4  11/17/2023 1153   MCHC 34.3 05/18/2017 0842   RDW 13.6 11/17/2023 1153   LYMPHSABS 1.9 01/10/2023 1203   MONOABS 406 09/28/2016 1247   EOSABS 0.1 01/10/2023 1203   BASOSABS 0.0 01/10/2023 1203    CMP     Component Value Date/Time   NA 142 11/17/2023 1153   K 4.9 11/17/2023 1153   CL 105 11/17/2023 1153   CO2 22 11/17/2023 1153   GLUCOSE 97 11/17/2023 1153   GLUCOSE 178 (H) 05/18/2017 0842   BUN 17 11/17/2023 1153   CREATININE 0.76 11/17/2023 1153   CREATININE 0.70 05/18/2017 0842   CALCIUM  9.9 11/17/2023 1153   PROT 6.4 11/17/2023 1153   ALBUMIN 4.3 11/17/2023 1153   AST 23 11/17/2023 1153   ALT 24 11/17/2023 1153   ALKPHOS 100 11/17/2023 1153   BILITOT 0.3 11/17/2023 1153   GFRNONAA 76 12/02/2020 1138   GFRAA 88 12/02/2020 1138     Assessment and Plan:   LEONTYNE MANVILLE is a 83 y.o. y/o female returns for 1-month follow-up of:  1.  Chronic constipation - Continue Linzess  once daily.  This has been refilled by her PCP.  It is provided through medication assistance throughout [employer]. - Recommend High Fiber diet with fruits, vegetables, and whole grains. - Drink 64 ounces of Fluids Daily.   2.  Abdominal Pain: LUQ, LLQ - Labs CBC, CMP, lipase - CT abdomen pelvis with contrast  4.  Weight Loss: Unintentional down 15 pounds. - CT abdomen pelvis with contrast  3.  History of colon polyps - No further colonoscopies recommended due to advanced age.   Ellouise Console, PA-C  Follow up based on test results and GI symptoms.

## 2024-06-15 ENCOUNTER — Ambulatory Visit: Payer: Self-pay | Admitting: Physician Assistant

## 2024-06-15 ENCOUNTER — Ambulatory Visit: Admitting: Physician Assistant

## 2024-06-15 ENCOUNTER — Other Ambulatory Visit (INDEPENDENT_AMBULATORY_CARE_PROVIDER_SITE_OTHER)

## 2024-06-15 ENCOUNTER — Encounter: Payer: Self-pay | Admitting: Physician Assistant

## 2024-06-15 VITALS — BP 104/62 | HR 88 | Ht 60.0 in | Wt 149.0 lb

## 2024-06-15 DIAGNOSIS — Z800 Family history of malignant neoplasm of digestive organs: Secondary | ICD-10-CM

## 2024-06-15 DIAGNOSIS — R1012 Left upper quadrant pain: Secondary | ICD-10-CM

## 2024-06-15 DIAGNOSIS — R634 Abnormal weight loss: Secondary | ICD-10-CM

## 2024-06-15 DIAGNOSIS — R1032 Left lower quadrant pain: Secondary | ICD-10-CM | POA: Diagnosis not present

## 2024-06-15 DIAGNOSIS — Z860100 Personal history of colon polyps, unspecified: Secondary | ICD-10-CM

## 2024-06-15 DIAGNOSIS — K5909 Other constipation: Secondary | ICD-10-CM | POA: Diagnosis not present

## 2024-06-15 DIAGNOSIS — K5900 Constipation, unspecified: Secondary | ICD-10-CM

## 2024-06-15 LAB — COMPREHENSIVE METABOLIC PANEL WITH GFR
ALT: 20 U/L (ref 0–35)
AST: 16 U/L (ref 0–37)
Albumin: 4.3 g/dL (ref 3.5–5.2)
Alkaline Phosphatase: 98 U/L (ref 39–117)
BUN: 25 mg/dL — ABNORMAL HIGH (ref 6–23)
CO2: 24 meq/L (ref 19–32)
Calcium: 9.3 mg/dL (ref 8.4–10.5)
Chloride: 104 meq/L (ref 96–112)
Creatinine, Ser: 0.78 mg/dL (ref 0.40–1.20)
GFR: 69.32 mL/min (ref >60.00)
Glucose, Bld: 146 mg/dL — ABNORMAL HIGH (ref 70–99)
Potassium: 4 meq/L (ref 3.5–5.1)
Sodium: 135 meq/L (ref 135–145)
Total Bilirubin: 0.6 mg/dL (ref 0.2–1.2)
Total Protein: 6.8 g/dL (ref 6.0–8.3)

## 2024-06-15 LAB — CBC WITH DIFFERENTIAL/PLATELET
Basophils Absolute: 0 K/uL (ref 0.0–0.1)
Basophils Relative: 0.5 % (ref 0.0–3.0)
Eosinophils Absolute: 0.1 K/uL (ref 0.0–0.7)
Eosinophils Relative: 1.2 % (ref 0.0–5.0)
HCT: 38.6 % (ref 36.0–46.0)
Hemoglobin: 13.2 g/dL (ref 12.0–15.0)
Lymphocytes Relative: 27.3 % (ref 12.0–46.0)
Lymphs Abs: 1.8 K/uL (ref 0.7–4.0)
MCHC: 34.3 g/dL (ref 30.0–36.0)
MCV: 86.7 fl (ref 78.0–100.0)
Monocytes Absolute: 0.4 K/uL (ref 0.1–1.0)
Monocytes Relative: 5.6 % (ref 3.0–12.0)
Neutro Abs: 4.3 K/uL (ref 1.4–7.7)
Neutrophils Relative %: 65.4 % (ref 43.0–77.0)
Platelets: 170 K/uL (ref 150.0–400.0)
RBC: 4.45 Mil/uL (ref 3.87–5.11)
RDW: 13.4 % (ref 11.5–15.5)
WBC: 6.6 K/uL (ref 4.0–10.5)

## 2024-06-15 LAB — LIPASE: Lipase: 18 U/L (ref 11.0–59.0)

## 2024-06-15 MED ORDER — LINZESS 290 MCG PO CAPS: 290.0000 ug | ORAL_CAPSULE | Freq: Every day | ORAL | 1 refills | Status: AC

## 2024-06-15 NOTE — Patient Instructions (Addendum)
 Your provider has requested that you go to the basement level for lab work before leaving today. Press B on the elevator. The lab is located at the first door on the left as you exit the elevator.  Remain on Linzess  290 mcg daily. We have sent refills to your pharmacy.  You have been scheduled for a CT scan of the abdomen and pelvis at [facility] location). You are scheduled on 06/21/24 at 1:20pm. You should arrive 15 minutes prior to your appointment time for registration.  We are giving you 2 bottles of contrast today that you will need to drink before arriving for the exam. The solution may taste better if refrigerated so put them in the refrigerator when you get home, but do NOT add ice or any other liquid to this solution as that would dilute it. Shake well before drinking.   Please follow the written instructions below on the day of your exam:   1) Do not eat anything after 9:20am (4 hours prior to your test)     You may take any medications as prescribed with a small amount of water, if necessary. If you take any of the following medications: METFORMIN , GLUCOPHAGE , GLUCOVANCE, AVANDAMET, RIOMET , FORTAMET , ACTOPLUS MET, JANUMET , GLUMETZA  or METAGLIP, you MAY be asked to HOLD this medication 48 hours AFTER the exam.   The purpose of you drinking the oral contrast is to aid in the visualization of your intestinal tract. The contrast solution may cause some diarrhea. Depending on your individual set of symptoms, you may also receive an intravenous injection of x-ray contrast/dye. Plan on being at [facility] for 45 minutes or longer, depending on the type of exam you are having performed.   _______________________________________________________  If your blood pressure at your visit was 140/90 or greater, please contact your primary care physician to follow up on this.  _______________________________________________________  If you are age 17 or older, your body mass  index should be between 23-30. Your Body mass index is 29.1 kg/m. If this is out of the aforementioned range listed, please consider follow up with your Primary Care Provider.  If you are age 85 or younger, your body mass index should be between 19-25. Your Body mass index is 29.1 kg/m. If this is out of the aformentioned range listed, please consider follow up with your Primary Care Provider.   ________________________________________________________  The [facility] providers would like to encourage you to use MYCHART to communicate with providers for non-urgent requests or questions.  Due to long hold times on the telephone, sending your provider a message by [facility] may be a faster and more efficient way to get a response.  Please allow 48 business hours for a response.  Please remember that this is for non-urgent requests.  _______________________________________________________  [facility] is using a team-based approach to care.  Your team is made up of your doctor and two to three APPS. Our APPS (Nurse Practitioners and Physician Assistants) work with your physician to ensure care continuity for you. They are fully qualified to address your health concerns and develop a treatment plan. They communicate directly with your gastroenterologist to care for you. Seeing the Advanced Practice Practitioners on your physician's team can help you by facilitating care more promptly, often allowing for earlier appointments, access to diagnostic testing, procedures, and other specialty referrals.

## 2024-06-15 NOTE — Progress Notes (Signed)
 Agree with assessment and plan as outlined. We can do CT, ensure no malignancy, however if she is concerned about weight loss she should stop Ozempic , which could be contributing and is certainly confounding the picture. Thanks

## 2024-06-21 ENCOUNTER — Ambulatory Visit
Admission: RE | Admit: 2024-06-21 | Discharge: 2024-06-21 | Disposition: A | Discharge status: Home / Self Care | Source: Ambulatory Visit | Attending: Physician Assistant | Admitting: Physician Assistant

## 2024-06-21 DIAGNOSIS — R634 Abnormal weight loss: Secondary | ICD-10-CM

## 2024-06-21 DIAGNOSIS — R1032 Left lower quadrant pain: Secondary | ICD-10-CM

## 2024-06-21 DIAGNOSIS — K760 Fatty (change of) liver, not elsewhere classified: Secondary | ICD-10-CM | POA: Diagnosis not present

## 2024-06-21 DIAGNOSIS — K5730 Diverticulosis of large intestine without perforation or abscess without bleeding: Secondary | ICD-10-CM | POA: Diagnosis not present

## 2024-06-21 DIAGNOSIS — Z800 Family history of malignant neoplasm of digestive organs: Secondary | ICD-10-CM

## 2024-06-21 DIAGNOSIS — R1012 Left upper quadrant pain: Secondary | ICD-10-CM

## 2024-06-21 MED ORDER — IOPAMIDOL (ISOVUE-300) INJECTION 61%
100.0000 mL | Freq: Once | INTRAVENOUS | Status: AC | PRN
  Administered 2024-06-21: 100 mL via INTRAVENOUS

## 2024-06-21 NOTE — Telephone Encounter (Signed)
 PAP Ozempic received, left pt message

## 2024-06-22 NOTE — Telephone Encounter (Signed)
 Clifford thanks

## 2024-07-05 ENCOUNTER — Other Ambulatory Visit

## 2024-07-05 DIAGNOSIS — E118 Type 2 diabetes mellitus with unspecified complications: Secondary | ICD-10-CM

## 2024-07-05 NOTE — Progress Notes (Signed)
 07/05/2024 Name: NAYELIS BONITO MRN: [id number] DOB: [birth year]  Chief Complaint  Patient presents with   Medication Management   Diabetes    JASMYN PICHA is a 83 y.o. year old female who presented via telephone today for phone call visit.   They were referred to the pharmacist by their PCP for assistance in managing diabetes and medication access.    Subjective:  Care Team: Primary Care Provider: Bulah Alm RAMAN, PA-C    Medication Access/Adherence  Current Pharmacy:  [id number] - [place], [place] - [address] [place] [address] Phone: [phone] Fax: [phone]   Patient reports affordability concerns with their medications: No  Patient reports access/transportation concerns to their pharmacy: No  Patient reports adherence concerns with their medications:  No     Diabetes:  Current medications: Ozempic  1mg  weekly on Sundays, Lantus  7 units Medications tried in the past: Farxiga , Levemir , Glyburide , Tresiba , Metformin , Janmet, Lantus   Current glucose readings:     Patient denies hypoglycemic s/sx including dizziness, shakiness, sweating. Patient denies hyperglycemic symptoms including polyuria, polydipsia, polyphagia, nocturia, neuropathy, blurred vision.  Current meal patterns:  Tries to be mindful of carbs but states she could be better   Current medication access support: Lantus  through [employer], Ozempic  through [employer]  Would like to finish out supply of 1mg  before starting 2mg    Objective:  Lab Results  Component Value Date   HGBA1C 6.9 (A) 03/05/2024    Lab Results  Component Value Date   CREATININE 0.78 06/15/2024   BUN 25 (H) 06/15/2024   NA 135 06/15/2024   K 4.0 06/15/2024   CL 104 06/15/2024   CO2 24 06/15/2024    Lab Results  Component Value Date   CHOL 127 07/21/2023   HDL 52 07/21/2023   LDLCALC 52 07/21/2023   TRIG 132 07/21/2023   CHOLHDL 2.4 07/21/2023    Medications Reviewed  Today     Reviewed by [name], RPH (Pharmacist) on 07/05/24 at 1532  Med List Status: <None>   Medication Order Taking? Sig Documenting Provider Last Dose Status Informant  aspirin  EC 81 MG tablet [id number] Yes Take 1 tablet (81 mg total) by mouth daily. Tysinger, Alm RAMAN, PA-C  Active            Med Note [name]   Thu Dec 08, 2023  9:01 AM) qod  Blood Glucose Monitoring Suppl DEVI [id number]  Test 1-2 times day. One touch ultra 2 Tysinger, Alm RAMAN, PA-C  Active   Continuous Glucose Sensor (FREESTYLE LIBRE 3 PLUS SENSOR) MISC [id number] Yes Use as directed to continuously monitor blood sugars. Change sensor every 15 days. Tysinger, [name], PA-C  Active   Fiber POWD [id number]  Take 1 Dose by mouth as needed. [provider]  Active   gabapentin  (NEURONTIN ) 100 MG capsule [id number] Yes TAKE 1 CAPSULE BY MOUTH AT BEDTIME Bulah Alm RAMAN, PA-C  Active   Glucose Blood (BLOOD GLUCOSE TEST STRIPS) STRP [id number]  Test 1-2 times daily Bulah Alm RAMAN, PA-C  Active   insulin  glargine (LANTUS ) 100 unit/mL SOPN [id number]  Inject 10 Units into the skin daily.  Patient taking differently: Inject 7 Units into the skin daily.   Tysinger, Alm RAMAN, PA-C  Active            Med Note ABE, SABRINA A   Mon Mar 05, 2024 10:48 AM)    Insulin  Pen Needle (B-D UF III MINI PEN NEEDLES)  31G X 5 MM MISC [id number]  USE ONCE DAILY Bulah Alm GORMAN [name]  Active   Lancet Device MISC [id number]  1-2 times daily Tysinger, Alm GORMAN, PA-C  Active   Lancets Misc. MISC [id number]  1-2 times a day Bulah Alm GORMAN, PA-C  Active   latanoprost (XALATAN) 0.005 % ophthalmic solution [id number] Yes SMARTSIG:In Eye(s) [provider]  Active   LINZESS  290 MCG CAPS capsule [id number] Yes Take 1 capsule (290 mcg total) by mouth daily before breakfast. Take 1, Every morning before breakfast [place], PA-C  Active   lisinopril  (ZESTRIL ) 5 MG tablet [id number] Yes Take 1 tablet (5 mg total) by  mouth daily. Tysinger, Alm GORMAN, PA-C  Active   nitrofurantoin , macrocrystal-monohydrate, (MACROBID ) 100 MG capsule [id number]  Take 100 mg by mouth daily. [provider]  Active   omeprazole  (PRILOSEC) 40 MG capsule [id number] Yes Take 1 capsule (40 mg total) by mouth every morning. Take 30 minutes before breakfast. Cara Elida [name], NP  Active   OneTouch Delica Lancets 33G MISC [id number]  1 each by Does not apply route in the morning and at bedtime. Dx:E11.9 Tysinger, Alm GORMAN, PA-C  Active   polyethylene glycol powder (GLYCOLAX /MIRALAX ) 17 GM/SCOOP powder [id number]  Take 17 g by mouth daily. [provider]  Active   rosuvastatin  (CRESTOR ) 20 MG tablet [id number] Yes Take 1 tablet (20 mg total) by mouth daily. Tysinger, Alm GORMAN, PA-C  Active   Semaglutide , 1 MG/DOSE, 4 MG/3ML SOPN [id number] Yes Inject 1 mg into the skin once a week. Tysinger, Alm GORMAN, PA-C  Active   vitamin B-6 (PYRIDOXINE) 25 MG tablet [id number] Yes Take 1 tablet (25 mg total) by mouth daily. Tysinger, Alm GORMAN, PA-C  Active               Assessment/Plan:   Diabetes: - Currently controlled  - Reviewed long term cardiovascular and renal outcomes of uncontrolled blood sugar - Reviewed goal A1c, goal fasting, and goal 2 hour post prandial glucose - Reviewed dietary modifications including low carb diet - Patient denies personal or family history of multiple endocrine neoplasia type 2, medullary thyroid  cancer; personal history of pancreatitis or gallbladder disease -Increase lantus  to 8 units daily, continue ozempic  1mg  to finish out supply (has 3 doses left, then will pick up the ozempic  2mg  - may need to decrease lantus  at that time) -Contact us  sooner than next follow up for any questions or concerns.   Follow Up Plan: 2 weeks  [name], PharmD Clinical Pharmacist [phone]

## 2024-07-12 ENCOUNTER — Telehealth: Payer: Self-pay

## 2024-07-12 NOTE — Progress Notes (Signed)
   07/12/2024  Patient ID: Marissa Oliver, female   DOB: [birth year], 83 y.o.   MRN: [id number]  Received request from [facility] for refill on patient's Ozempic  2mg .  Sent in 4 month supply order, fax confirmation received.  [name], PharmD Clinical Pharmacist [phone]

## 2024-07-18 ENCOUNTER — Telehealth: Payer: Self-pay

## 2024-07-18 ENCOUNTER — Other Ambulatory Visit (INDEPENDENT_AMBULATORY_CARE_PROVIDER_SITE_OTHER)

## 2024-07-18 DIAGNOSIS — E118 Type 2 diabetes mellitus with unspecified complications: Secondary | ICD-10-CM

## 2024-07-18 NOTE — Progress Notes (Signed)
   07/18/2024  Patient ID: Marissa Oliver, female   DOB: 1941/09/08, 83 y.o.   MRN: [id number]  Attempted to contact patient to discuss her Ozempic  dose and current use. Left HIPAA compliant message for patient to return my call at their convenience.   [name], PharmD Clinical Pharmacist [phone]

## 2024-07-18 NOTE — Progress Notes (Signed)
 07/18/2024 Name: Marissa Oliver MRN: [id number] DOB: [birth year]  Chief Complaint  Patient presents with   Medication Management   Diabetes    Marissa Oliver is a 83 y.o. year old female who presented via telephone today for phone call visit.   They were referred to the pharmacist by their PCP for assistance in managing diabetes and medication access.    Subjective:  Care Team: Primary Care Provider: Bulah Alm RAMAN, PA-C    Medication Access/Adherence  Current Pharmacy:  [id number] - [place], [place] - [address] [place] [address] Phone: [phone] Fax: [phone]   Patient reports affordability concerns with their medications: No  Patient reports access/transportation concerns to their pharmacy: No  Patient reports adherence concerns with their medications:  No     Diabetes:  Current medications: Ozempic  1mg  weekly on Sundays, Lantus  7 units Medications tried in the past: Farxiga , Levemir , Glyburide , Tresiba , Metformin , Janmet, Lantus   Current glucose readings:     Patient denies hypoglycemic s/sx including dizziness, shakiness, sweating. Patient denies hyperglycemic symptoms including polyuria, polydipsia, polyphagia, nocturia, neuropathy, blurred vision.  Current meal patterns:  Tries to be mindful of carbs but states she could be better   Current medication access support: Lantus  through [employer], Ozempic  through [employer]  Would like to finish out supply of 1mg  before starting 2mg    Objective:  Lab Results  Component Value Date   HGBA1C 6.9 (A) 03/05/2024    Lab Results  Component Value Date   CREATININE 0.78 06/15/2024   BUN 25 (H) 06/15/2024   NA 135 06/15/2024   K 4.0 06/15/2024   CL 104 06/15/2024   CO2 24 06/15/2024    Lab Results  Component Value Date   CHOL 127 07/21/2023   HDL 52 07/21/2023   LDLCALC 52 07/21/2023   TRIG 132 07/21/2023   CHOLHDL 2.4 07/21/2023    Medications  Reviewed Today     Reviewed by [name], RPH (Pharmacist) on 07/18/24 at 1254  Med List Status: <None>   Medication Order Taking? Sig Documenting Provider Last Dose Status Informant  aspirin  EC 81 MG tablet [id number]  Take 1 tablet (81 mg total) by mouth daily. Tysinger, Alm RAMAN, PA-C  Active            Med Note [name]   Thu Dec 08, 2023  9:01 AM) qod  Blood Glucose Monitoring Suppl DEVI [id number]  Test 1-2 times day. One touch ultra 2 Tysinger, Alm RAMAN, PA-C  Active   Continuous Glucose Sensor (FREESTYLE LIBRE 3 PLUS SENSOR) [place] [id number]  Use as directed to continuously monitor blood sugars. Change sensor every 15 days. Tysinger, [name], PA-C  Active   Fiber POWD [id number]  Take 1 Dose by mouth as needed. [provider]  Active   gabapentin  (NEURONTIN ) 100 MG capsule [id number]  TAKE 1 CAPSULE BY MOUTH AT BEDTIME Bulah Alm RAMAN, PA-C  Active   Glucose Blood (BLOOD GLUCOSE TEST STRIPS) STRP [id number]  Test 1-2 times daily Bulah Alm RAMAN [name]  Active   insulin  glargine (LANTUS ) 100 unit/mL SOPN [id number]  Inject 10 Units into the skin daily.  Patient taking differently: Inject 7 Units into the skin daily.   Tysinger, Alm RAMAN, PA-C  Active            Med Note ABE, SABRINA A   Mon Mar 05, 2024 10:48 AM)    Insulin  Pen Needle (B-D UF III MINI PEN NEEDLES)  31G X 5 MM MISC [id number]  USE ONCE DAILY Bulah Alm GORMAN [name]  Active   Lancet Device MISC [id number]  1-2 times daily Tysinger, Alm GORMAN, PA-C  Active   Lancets Misc. MISC [id number]  1-2 times a day Bulah Alm GORMAN, PA-C  Active   latanoprost (XALATAN) 0.005 % ophthalmic solution [id number]  SMARTSIG:In Eye(s) [provider]  Active   LINZESS  290 MCG CAPS capsule [id number]  Take 1 capsule (290 mcg total) by mouth daily before breakfast. Take 1, Every morning before breakfast [place], PA-C  Active   lisinopril  (ZESTRIL ) 5 MG tablet [id number]  Take 1 tablet (5 mg total) by mouth daily.  Tysinger, Alm GORMAN, PA-C  Active   nitrofurantoin , macrocrystal-monohydrate, (MACROBID ) 100 MG capsule [id number]  Take 100 mg by mouth daily. [provider]  Active   omeprazole  (PRILOSEC) 40 MG capsule [id number]  Take 1 capsule (40 mg total) by mouth every morning. Take 30 minutes before breakfast. Cara Elida [name], NP  Active   OneTouch Delica Lancets 33G MISC [id number]  1 each by Does not apply route in the morning and at bedtime. Dx:E11.9 Tysinger, Alm GORMAN, PA-C  Active   polyethylene glycol powder (GLYCOLAX /MIRALAX ) 17 GM/SCOOP powder [id number]  Take 17 g by mouth daily. [provider]  Active   rosuvastatin  (CRESTOR ) 20 MG tablet [id number]  Take 1 tablet (20 mg total) by mouth daily. Tysinger, Alm GORMAN, PA-C  Active   Semaglutide , 1 MG/DOSE, 4 MG/3ML SOPN [id number]  Inject 1 mg into the skin once a week. Tysinger, Alm GORMAN, PA-C  Active   vitamin B-6 (PYRIDOXINE) 25 MG tablet [id number]  Take 1 tablet (25 mg total) by mouth daily. Tysinger, Alm GORMAN, PA-C  Active               Assessment/Plan:   Diabetes: - Currently controlled  - Reviewed long term cardiovascular and renal outcomes of uncontrolled blood sugar - Reviewed goal A1c, goal fasting, and goal 2 hour post prandial glucose - Reviewed dietary modifications including low carb diet - Patient denies personal or family history of multiple endocrine neoplasia type 2, medullary thyroid  cancer; personal history of pancreatitis or gallbladder disease -Continue lantus  8 units daily; continue ozempic  1mg  to finish out supply (has 3 doses left, then will pick up the ozempic  2mg  - may need to decrease lantus  at that time) -Contact us  sooner than next follow up for any questions or concerns.   Follow Up Plan: 1 month  [name], PharmD Clinical Pharmacist [phone]

## 2024-07-19 ENCOUNTER — Other Ambulatory Visit

## 2024-07-23 DIAGNOSIS — E118 Type 2 diabetes mellitus with unspecified complications: Secondary | ICD-10-CM

## 2024-07-23 MED ORDER — GABAPENTIN 100 MG PO CAPS: 100.0000 mg | ORAL_CAPSULE | Freq: Every day | ORAL | 1 refills | Status: DC

## 2024-07-23 MED ORDER — ROSUVASTATIN CALCIUM 20 MG PO TABS: 20.0000 mg | ORAL_TABLET | Freq: Every day | ORAL | 1 refills | Status: DC

## 2024-07-23 MED ORDER — BD PEN NEEDLE MINI U/F 31G X 5 MM MISC: 1 refills | Status: AC

## 2024-07-23 MED ORDER — LISINOPRIL 5 MG PO TABS: 5.0000 mg | ORAL_TABLET | Freq: Every day | ORAL | 1 refills | Status: DC

## 2024-07-23 NOTE — Telephone Encounter (Signed)
 Spoke to patient and sent meds per patient to mail order

## 2024-07-24 MED ORDER — FREESTYLE LIBRE 3 PLUS SENSOR MISC: 3 refills | Status: AC

## 2024-07-25 DIAGNOSIS — E119 Type 2 diabetes mellitus without complications: Secondary | ICD-10-CM | POA: Diagnosis not present

## 2024-07-25 DIAGNOSIS — H401132 Primary open-angle glaucoma, bilateral, moderate stage: Secondary | ICD-10-CM | POA: Diagnosis not present

## 2024-07-25 DIAGNOSIS — Z961 Presence of intraocular lens: Secondary | ICD-10-CM | POA: Diagnosis not present

## 2024-07-25 LAB — HM DIABETES EYE EXAM

## 2024-07-26 MED ORDER — OMEPRAZOLE 40 MG PO CPDR: 40.0000 mg | DELAYED_RELEASE_CAPSULE | Freq: Every morning | ORAL | 3 refills | Status: AC

## 2024-07-29 ENCOUNTER — Ambulatory Visit: Payer: Self-pay | Admitting: Medical

## 2024-08-03 ENCOUNTER — Telehealth: Payer: Self-pay

## 2024-08-03 NOTE — Progress Notes (Signed)
   08/03/2024  Patient ID: Marissa Oliver, female   DOB: [birth year], 83 y.o.   MRN: [id number]  Patient called in reporting issue with her CGM sensors.  She had to update to the new [place] App and once she did this, her phone is not allowing her to pair to her new sensor. She attempted to contact libre support and they were unable to assist.  Scheduled in office visit for patient to come in with phone and sample and attempt to troubleshoot the issue.  Counseled patient to utilize fingerprick monitoring in the interim. She agrees and confirms she still has fingerprick supplies.  [name], PharmD Clinical Pharmacist [phone]

## 2024-08-03 NOTE — Progress Notes (Signed)
   08/03/2024  Patient ID: Sherrilyn CHRISTELLA [place], female   DOB: [birth year], 83 y.o.   MRN: [id number]  Received after hours voicemail from patient regarding issues with use of her CGM app and asking for assistance.  Attempted to contact patient. Left HIPAA compliant message for patient to return my call at their convenience.    [name], PharmD Clinical Pharmacist [phone]

## 2024-08-07 ENCOUNTER — Telehealth: Payer: Self-pay

## 2024-08-07 NOTE — Telephone Encounter (Signed)
 Gave [facility] a call to follow up on refill reorder on ozempic ,spoke with representative explain on pg 9 provider portion needs to be fix and change to just Ozempic  2 mg if this is what the provider wants on the refill form they also add Ozempic  0.25 mg per representative  it has to be one or the other.

## 2024-08-09 ENCOUNTER — Ambulatory Visit

## 2024-08-15 ENCOUNTER — Other Ambulatory Visit: Payer: Self-pay | Admitting: Medical

## 2024-08-15 DIAGNOSIS — E118 Type 2 diabetes mellitus with unspecified complications: Secondary | ICD-10-CM

## 2024-08-15 MED ORDER — LISINOPRIL 5 MG PO TABS: 5.0000 mg | ORAL_TABLET | Freq: Every day | ORAL | 1 refills | Status: AC

## 2024-08-15 MED ORDER — ROSUVASTATIN CALCIUM 20 MG PO TABS: 20.0000 mg | ORAL_TABLET | Freq: Every day | ORAL | 1 refills | Status: AC

## 2024-08-15 MED ORDER — GABAPENTIN 100 MG PO CAPS: 100.0000 mg | ORAL_CAPSULE | Freq: Every day | ORAL | 1 refills | Status: DC

## 2024-08-23 ENCOUNTER — Other Ambulatory Visit

## 2024-08-23 DIAGNOSIS — E118 Type 2 diabetes mellitus with unspecified complications: Secondary | ICD-10-CM

## 2024-08-23 NOTE — Progress Notes (Signed)
 08/23/2024 Name: Marissa Oliver MRN: [id number] DOB: 1940/12/17  Chief Complaint  Patient presents with   Medication Management   Diabetes    Marissa Oliver is a 83 y.o. year old female who presented via telephone today for phone call visit.   They were referred to the pharmacist by their PCP for assistance in managing diabetes and medication access.    Subjective:  Care Team: Primary Care Provider: Bulah Alm RAMAN, PA-C    Medication Access/Adherence  Current Pharmacy:  [facility] - [place], [place] - [address] [facility] [address] to [facility] [address] [place] [address] Phone: [phone] Fax: [phone]   Patient reports affordability concerns with their medications: No  Patient reports access/transportation concerns to their pharmacy: No  Patient reports adherence concerns with their medications:  No     Diabetes:  Current medications: Ozempic  2mg  weekly on Sundays, Lantus  8 units Medications tried in the past: Farxiga , Levemir , Glyburide , Tresiba , Metformin , Janmet, Lantus   Current glucose readings:  Having issues with patient connecting to office today, reporting controlled readings   Patient denies hypoglycemic s/sx including dizziness, shakiness, sweating. Patient denies hyperglycemic symptoms including polyuria, polydipsia, polyphagia, nocturia, neuropathy, blurred vision.  Current meal patterns:  Tries to be mindful of carbs but states she could be better   Current medication access support: Lantus  through [employer], Ozempic  through [employer]  Aware that Ozempic  program with [employer] is ending for 2026    Objective:  Lab Results  Component Value Date   HGBA1C 6.9 (A) 03/05/2024    Lab Results  Component Value Date   CREATININE 0.78 06/15/2024   BUN 25 (H) 06/15/2024   NA 135 06/15/2024   K 4.0 06/15/2024   CL 104 06/15/2024   CO2 24 06/15/2024    Lab Results  Component Value Date    CHOL 127 07/21/2023   HDL 52 07/21/2023   LDLCALC 52 07/21/2023   TRIG 132 07/21/2023   CHOLHDL 2.4 07/21/2023    Medications Reviewed Today     Reviewed by [name], RPH (Pharmacist) on 08/23/24 at [phone]  Med List Status: <None>   Medication Order Taking? Sig Documenting Provider Last Dose Status Informant  aspirin  EC 81 MG tablet [id number] Yes Take 1 tablet (81 mg total) by mouth daily. Tysinger, Alm RAMAN, PA-C  Active            Med Note [name]   Thu Dec 08, 2023  9:01 AM) qod  Blood Glucose Monitoring Suppl DEVI [id number]  Test 1-2 times day. One touch ultra 2 Tysinger, Alm RAMAN, PA-C  Active   Continuous Glucose Sensor (FREESTYLE LIBRE 3 PLUS SENSOR) [place] [id number]  Use as directed to continuously monitor blood sugars. Change sensor every 15 days. Bulah Alm RAMAN, PA-C  Active   Fiber POWD [id number] Yes Take 1 Dose by mouth as needed. [provider]  Active   gabapentin  (NEURONTIN ) 100 MG capsule [id number] Yes Take 1 capsule (100 mg total) by mouth at bedtime. TysingerAlm RAMAN, PA-C  Active   Glucose Blood (BLOOD GLUCOSE TEST STRIPS) STRP [id number]  Test 1-2 times daily Bulah Alm RAMAN, PA-C  Active   insulin  glargine (LANTUS ) 100 unit/mL SOPN [id number] Yes Inject 10 Units into the skin daily.  Patient taking differently: Inject 8 Units into the skin daily.   Tysinger, Alm RAMAN, PA-C  Active            Med Note ABE, SABRINA  A   Mon Mar 05, 2024 10:48 AM)    Insulin  Pen Needle (B-D UF III MINI PEN NEEDLES) 31G X 5 MM MISC [id number]  USE ONCE DAILY Bulah Alm GORMAN [name]  Active   Lancet Device MISC [id number]  1-2 times daily Tysinger, Alm [place], PA-C  Active   Lancets Misc. MISC [id number]  1-2 times a day Bulah Alm [place], PA-C  Active   latanoprost (XALATAN) 0.005 % ophthalmic solution [id number] Yes SMARTSIG:In Eye(s) [provider]  Active   LINZESS  290 MCG CAPS capsule [id number] Yes Take 1 capsule (290 mcg total) by mouth daily before  breakfast. Take 1, Every morning before breakfast [place], PA-C  Active   lisinopril  (ZESTRIL ) 5 MG tablet [id number] Yes Take 1 tablet (5 mg total) by mouth daily. Tysinger, Alm GOR[place], PA-C  Active   nitrofurantoin , macrocrystal-monohydrate, (MACROBID ) 100 MG capsule [id number] Yes Take 100 mg by mouth daily. [provider]  Active   omeprazole  (PRILOSEC) 40 MG capsule [id number] Yes Take 1 capsule (40 mg total) by mouth every morning. Take 30 minutes before breakfast. [place], PA-C  Active   OneTouch Delica Lancets 33G MISC [id number]  1 each by Does not apply route in the morning and at bedtime. Dx:E11.9 Tysinger, Alm [place], PA-C  Active   polyethylene glycol powder (GLYCOLAX /MIRALAX ) 17 GM/SCOOP powder [id number] Yes Take 17 g by mouth daily. [provider]  Active   rosuvastatin  (CRESTOR ) 20 MG tablet [id number] Yes Take 1 tablet (20 mg total) by mouth daily. Tysinger, Alm [place], PA-C  Active   Semaglutide , 1 MG/DOSE, 4 MG/3ML SOPN [id number]  Inject 1 mg into the skin once a week. Tysinger, Alm [place], PA-C  Active   vitamin B-6 (PYRIDOXINE) 25 MG tablet [id number]  Take 1 tablet (25 mg total) by mouth daily. Tysinger, Alm [place], PA-C  Active               Assessment/Plan:   Diabetes: - Currently controlled  - Reviewed long term cardiovascular and renal outcomes of uncontrolled blood sugar - Reviewed goal A1c, goal fasting, and goal 2 hour post prandial glucose - Reviewed dietary modifications including low carb diet - Patient denies personal or family history of multiple endocrine neoplasia type 2, medullary thyroid  cancer; personal history of pancreatitis or gallbladder disease -Continue lantus  8 units daily and Ozempic  2mg . For future, will need to sent rx to pharmacy for Ozempic  to assess affordability at pharmacy for 2026. If not affordable, patient reports she would be willing to try metformin  again. Other possibility would be switching for lantus  to soliqua for  combo insulin /glp-1 -Contact us  sooner than next follow up for any questions or concerns.   Follow Up Plan: 1 month  [name], PharmD Clinical Pharmacist [phone]

## 2024-08-23 NOTE — Telephone Encounter (Signed)
 PAP Ozempic  2 mg received, pt informed by Jon

## 2024-09-05 ENCOUNTER — Telehealth: Payer: Self-pay

## 2024-09-05 NOTE — Telephone Encounter (Signed)
 Filled and mail out pt portion [employer] Linzess  and [employer] Lantus , pt portion was mail out provider portion was faxed today.

## 2024-09-07 DIAGNOSIS — E1165 Type 2 diabetes mellitus with hyperglycemia: Secondary | ICD-10-CM | POA: Diagnosis not present

## 2024-09-10 NOTE — Telephone Encounter (Signed)
 Spoke to pt, she has appt on 11/25 and  will just wait until then. If something changes she will call back for sooner appt

## 2024-09-11 ENCOUNTER — Ambulatory Visit: Payer: Self-pay | Admitting: *Deleted

## 2024-09-11 NOTE — Telephone Encounter (Signed)
 FYI Only or Action Required?: FYI only for provider: appointment scheduled on 11/13.  Patient was last seen in primary care on 05/29/2024 by Marissa Alm RAMAN, PA-C.  Called Nurse Triage reporting Hypoglycemia.  Symptoms began several days ago.  Interventions attempted: Prescription medications: Insulin  decrease and Dietary changes.  Symptoms are: unchanged.  Triage Disposition: Home Care  Patient/caregiver understands and will follow disposition?: Yes- patient requested appointment and she has been scheduled     Copied from CRM #[id number]. Topic: Clinical - Red Word Triage >> Sep 11, 2024 11:15 AM Darshell M wrote: Red Word that prompted transfer to Nurse Triage: Blood glucose dropping. Started at 65, then 65, then 67. She took a glucose tablet to push it to 115. She hasn't had this issue for a while. Dr. Bulah wants patient to come in for a visit. Reason for Disposition  Low blood sugar prevention, questions about  Answer Assessment - Initial Assessment Questions 1. SYMPTOMS: What symptoms are you concerned about?     Fluctuating glucose- going too low 2. ONSET:  When did the symptoms start?     2 days ago- during the night, yesterday it was fine 3. BLOOD GLUCOSE: What is your blood glucose level?      101- after glucose tablet 4. USUAL RANGE: What is your blood glucose level usually? (e.g., usual fasting morning value, usual evening value)     90 day 7.0 5. TYPE 1 or 2:  Do you know what type of diabetes you have?  (e.g., Type 1, Type 2, Gestational; doesn't know)      Type 2 6. INSULIN : Do you take insulin ? What type of insulin (s) do you use? What is the mode of delivery? (syringe, pen; injection or pump) When did you last give yourself an insulin  dose? (i.e., time or hours/minutes ago) How much did you give? (i.e., how many units)     Lantus - was on 8units- it was lowered to 3unit, Ozempic  7. DIABETES PILLS: Do you take any pills for your diabetes? If Yes,  ask: What is the name of the medicine(s) that you take for high blood sugar?     none 8. OTHER SYMPTOMS: Do you have any symptoms? (e.g., fever, frequent urination, difficulty breathing, vomiting)     no 9. LOW BLOOD GLUCOSE TREATMENT: What have you done so far to treat the low blood glucose level?     Glucose tablet 10. FOOD: When did you last eat or drink?       Breakfast- 7am 11. ALONE: Are you alone right now or is someone with you?        Patient lives alone  Protocols used: Diabetes - Low Blood Sugar-A-AH

## 2024-09-13 ENCOUNTER — Telehealth: Payer: Self-pay

## 2024-09-13 ENCOUNTER — Ambulatory Visit (INDEPENDENT_AMBULATORY_CARE_PROVIDER_SITE_OTHER): Admitting: Medical

## 2024-09-13 VITALS — BP 120/70 | HR 95 | Wt 146.2 lb

## 2024-09-13 DIAGNOSIS — Z794 Long term (current) use of insulin: Secondary | ICD-10-CM | POA: Diagnosis not present

## 2024-09-13 DIAGNOSIS — E559 Vitamin D deficiency, unspecified: Secondary | ICD-10-CM | POA: Diagnosis not present

## 2024-09-13 DIAGNOSIS — M810 Age-related osteoporosis without current pathological fracture: Secondary | ICD-10-CM | POA: Diagnosis not present

## 2024-09-13 DIAGNOSIS — E1169 Type 2 diabetes mellitus with other specified complication: Secondary | ICD-10-CM

## 2024-09-13 DIAGNOSIS — R0989 Other specified symptoms and signs involving the circulatory and respiratory systems: Secondary | ICD-10-CM

## 2024-09-13 DIAGNOSIS — Z7185 Encounter for immunization safety counseling: Secondary | ICD-10-CM | POA: Diagnosis not present

## 2024-09-13 DIAGNOSIS — R251 Tremor, unspecified: Secondary | ICD-10-CM

## 2024-09-13 DIAGNOSIS — Z23 Encounter for immunization: Secondary | ICD-10-CM

## 2024-09-13 DIAGNOSIS — E785 Hyperlipidemia, unspecified: Secondary | ICD-10-CM

## 2024-09-13 DIAGNOSIS — N3020 Other chronic cystitis without hematuria: Secondary | ICD-10-CM | POA: Diagnosis not present

## 2024-09-13 NOTE — Progress Notes (Signed)
   09/13/2024  Patient ID: Marissa Oliver, female   DOB: Aug 09, 1941, 83 y.o.   MRN: [id number]  Patient brought in her completed [employer] PAP renewal forms for Linzess  for 2026. Faxed in completed application to company, awaiting response.

## 2024-09-13 NOTE — Telephone Encounter (Signed)
 Pt brought pap abbive(linzess  to provider office [facility] Angela faxed to company.

## 2024-09-13 NOTE — Progress Notes (Signed)
 Subjective:  Marissa Oliver is a 83 y.o. female who presents for Chief Complaint  Patient presents with   Medical Management of Chronic Issues    Med check- low glucose levels      Marissa Oliver is an 83 year old female with diabetes who presents with concerns about medication management and recent hypoglycemic episodes.   She experienced a significant drop in blood glucose levels early Monday morning, with readings as low as 55 mg/dL. She managed this by consuming glucose tablets, which temporarily raised her levels, but they dropped again to 69 mg/dL and 65 mg/dL. This is the first time in two months she has experienced such low readings. Since then, her blood glucose has stabilized, with recent readings of 103 mg/dL and 887 mg/dL. She is currently on Ozempic  2 mg weekly and Lantus , which she has reduced to 3 units.  She just increased to 2mg  ozempic  in past 2 weeks.    She is concerned about upcoming changes in her medication coverage at the start of the year, as she was informed that Ozempic  might not be covered. Her daughter recently picked up her medication, and she is worried about the cost if she has to purchase it herself.  She is on Crestor  20 mg daily for cholesterol, aspirin  daily, Macrobid  for chronic urinary tract infection prevention, gabapentin  100 mg once daily, lisinopril  5 mg daily, and the highest dose of Linzess  (290 mcg) for constipation. Linzess  did not work the previous day but was effective the following morning. She notes that her urine and stools were 'real yellow,' which she attributes to stress based on her online research.  She mentions a past CT scan that revealed a fatty liver. She also had a growth on her eye removed last year, which has since returned. She experiences back tightness, especially after activities like vacuuming, and inquires about the potential use of a muscle relaxer.  She has a history of osteoporosis and is not currently on any treatment for it.  She experiences back pain and is considering options for managing her bone health. She previously received a Prolia  shot through a free program but is not currently on any osteoporosis medication. She takes vitamin D  regularly.  no other aggravating or relieving factors.    No other c/o.  Past Medical History:  Diagnosis Date   Atrophic vaginitis    estrace  cream rx'd by Dr. Ottelin   Colon polyp    tubular adenoma (colonoscopy q5 yrs)   Diabetes mellitus 2005   Diverticulosis    Dyslipidemia    GAD (generalized anxiety disorder)    Hemorrhoids    Hyperlipidemia    OAB (overactive bladder)    Osteoporosis    Psoriasis    mild, sees [facility]   Current Outpatient Medications on File Prior to Visit  Medication Sig Dispense Refill   aspirin  EC 81 MG tablet Take 1 tablet (81 mg total) by mouth daily. 90 tablet 3   gabapentin  (NEURONTIN ) 100 MG capsule Take 1 capsule (100 mg total) by mouth at bedtime. 90 capsule 1   insulin  glargine (LANTUS ) 100 unit/mL SOPN Inject 10 Units into the skin daily. (Patient taking differently: Inject 3 Units into the skin daily.)     latanoprost (XALATAN) 0.005 % ophthalmic solution SMARTSIG:In Eye(s)     LINZESS  290 MCG CAPS capsule Take 1 capsule (290 mcg total) by mouth daily before breakfast. Take 1, Every morning before breakfast 90 capsule 1   lisinopril  (ZESTRIL ) 5  MG tablet Take 1 tablet (5 mg total) by mouth daily. 90 tablet 1   nitrofurantoin , macrocrystal-monohydrate, (MACROBID ) 100 MG capsule Take 100 mg by mouth daily.     omeprazole  (PRILOSEC) 40 MG capsule Take 1 capsule (40 mg total) by mouth every morning. Take 30 minutes before breakfast. 90 capsule 3   polyethylene glycol powder (GLYCOLAX /MIRALAX ) 17 GM/SCOOP powder Take 17 g by mouth daily.     rosuvastatin  (CRESTOR ) 20 MG tablet Take 1 tablet (20 mg total) by mouth daily. 90 tablet 1   Semaglutide , 1 MG/DOSE, 4 MG/3ML SOPN Inject 1 mg into the skin once a week. 3 mL 3    vitamin B-6 (PYRIDOXINE) 25 MG tablet Take 1 tablet (25 mg total) by mouth daily. 90 tablet 0   Blood Glucose Monitoring Suppl DEVI Test 1-2 times day. One touch ultra 2 1 each 0   Continuous Glucose Sensor (FREESTYLE LIBRE 3 PLUS SENSOR) MISC Use as directed to continuously monitor blood sugars. Change sensor every 15 days. 6 each 3   Fiber POWD Take 1 Dose by mouth as needed.     Glucose Blood (BLOOD GLUCOSE TEST STRIPS) STRP Test 1-2 times daily 100 strip 1   Insulin  Pen Needle (B-D UF III MINI PEN NEEDLES) 31G X 5 MM MISC USE ONCE DAILY 300 each 1   Lancet Device MISC 1-2 times daily 1 each 0   Lancets Misc. MISC 1-2 times a day 100 each 1   OneTouch Delica Lancets 33G MISC 1 each by Does not apply route in the morning and at bedtime. Dx:E11.9 100 each 2   No current facility-administered medications on file prior to visit.     The following portions of the patient's history were reviewed and updated as appropriate: allergies, current medications, past family history, past medical history, past social history, past surgical history and problem list.  ROS Otherwise as in subjective above  Objective: BP 120/70   Pulse 95   Wt 146 lb 3.2 oz (66.3 kg)   SpO2 99%   BMI 28.55 kg/m   Wt Readings from Last 3 Encounters:  09/13/24 146 lb 3.2 oz (66.3 kg)  06/15/24 149 lb (67.6 kg)  05/29/24 153 lb 3.2 oz (69.5 kg)   General appearance: alert, no distress, well developed, well nourished Neck: supple, no lymphadenopathy, no thyromegaly, no masses Heart: RRR, normal S1, S2, no murmurs Lungs: CTA bilaterally, no wheezes, rhonchi, or rales Pulses: 2+ radial pulses, 1+ pedal pulses, decreased cap refill Ext: no edema  Diabetic Foot Exam - Simple   Simple Foot Form Diabetic Foot exam was performed with the following findings: Yes 09/13/2024  2:19 PM  Visual Inspection See comments: Yes Sensation Testing Intact to touch and monofilament testing bilaterally: Yes Pulse Check See  comments: Yes Comments 1+ pedal pulses, decreased cap refill      Assessment: Encounter Diagnoses  Name Primary?   Type 2 diabetes mellitus with other specified complication, with long-term current use of insulin  (HCC) Yes   Vaccine counseling    Vitamin D  deficiency    Osteoporosis, unspecified osteoporosis type, unspecified pathological fracture presence    Dyslipidemia associated with type 2 diabetes mellitus (HCC)    Need for pneumococcal 20-valent conjugate vaccination    Chronic cystitis    Tremor    Decreased pedal pulses      Plan: Type 2 diabetes mellitus with hypoglycemia and fatty liver Recent hypoglycemia likely due to increased Ozempic  dose. Ozempic  aids in blood sugar control, weight  loss, and fatty liver management. Insurance changes may affect availability. - Continue Ozempic  2 mg weekly for one month. - Maintain Lantus  at 3 units daily; discontinue if blood glucose levels drop to 70 mg/dL or lower. - Monitor for hypoglycemia and adjust Lantus  accordingly. - Discuss potential alternatives to Ozempic  if insurance changes occur.  Chronic Constipation Constipation potentially exacerbated by Ozempic . Balancing constipation management with hypoglycemia risk is necessary. - Continue Linzess  290 mcg daily, but consider lower dose given recent unusual stool - Monitor stool consistency and adjust Linzess  dose if necessary. - Consider reducing Ozempic  dose if constipation persists. -Gave some samples of 145mcg dose to try  Age-related osteoporosis Osteoporosis with increased fracture risk. Previous Prolia  use discontinued due to cost. Discussed potential use of Fosamax . - Encouraged weight-bearing exercises to strengthen bones. - I will review back over her prior treatments.  Continue vitamin D  supplement, calcium  supplement.  Consider other treatment options to reduce the rate of reabsorption of bone  Vitamin D  deficiency Currently taking vitamin D  supplements. -  Continue vitamin D  supplementation OTC  Dyslipidemia -continue Crestor  20mg  daily -continue Aspirin  81mg  daily  Vaccine recommendations: Counseled on the pneumococcal vaccine.   Pneumococcal vaccine Prevnar 20 given after consent obtained.  Chronic cystitis -continue Macrobid  100mg  daily for prevention per urology  Tremor - mild resting tremor, no worse in past year  Decreased cap refill -referral for updated ABI   Sabrie was seen today for medical management of chronic issues.  Diagnoses and all orders for this visit:  Type 2 diabetes mellitus with other specified complication, with long-term current use of insulin  (HCC) -     Cancel: Microalbumin/Creatinine Ratio, Urine -     Cancel: Urinalysis, Routine w reflex microscopic -     Hemoglobin A1c -     Comprehensive metabolic panel with GFR -     CBC -     VAS US  ABI WITH/WO TBI; Future -     Microalbumin/Creatinine Ratio, Urine; Future -     Urinalysis, Routine w reflex microscopic; Future  Vaccine counseling -     Pneumococcal conjugate vaccine 20-valent (Prevnar 20)  Vitamin D  deficiency -     CBC  Osteoporosis, unspecified osteoporosis type, unspecified pathological fracture presence -     CBC  Dyslipidemia associated with type 2 diabetes mellitus (HCC) -     Lipid panel -     Comprehensive metabolic panel with GFR -     VAS US  ABI WITH/WO TBI; Future  Need for pneumococcal 20-valent conjugate vaccination  Chronic cystitis  Tremor  Decreased pedal pulses -     VAS US  ABI WITH/WO TBI; Future   Spent > 45 minutes face to face with patient in discussion of symptoms, evaluation, plan and recommendations.     Follow up: pending labs

## 2024-09-14 LAB — URINALYSIS, ROUTINE W REFLEX MICROSCOPIC

## 2024-09-14 LAB — MICROALBUMIN / CREATININE URINE RATIO

## 2024-09-14 NOTE — Telephone Encounter (Signed)
 FYI: Spoke with patient. Explained she is to stay on the Ozempic  2mg  pens. The 1mg  pens are from a previous refill before the dose increase. Explained she can stick herself twice with 1mg  doses after she finishes out her 2mg  pens so they do not go to waste.  Patient expressed understanding.

## 2024-09-15 LAB — HEMOGLOBIN A1C
Est. average glucose Bld gHb Est-mCnc: 154 mg/dL
Hgb A1c MFr Bld: 7 % — ABNORMAL HIGH (ref 4.8–5.6)

## 2024-09-15 LAB — CBC
Hematocrit: 41 % (ref 34.0–46.6)
Hemoglobin: 13.3 g/dL (ref 11.1–15.9)
MCH: 29.8 pg (ref 26.6–33.0)
MCHC: 32.4 g/dL (ref 31.5–35.7)
MCV: 92 fL (ref 79–97)
Platelets: 200 x10E3/uL (ref 150–450)
RBC: 4.46 x10E6/uL (ref 3.77–5.28)
RDW: 13 % (ref 11.7–15.4)
WBC: 6.1 x10E3/uL (ref 3.4–10.8)

## 2024-09-15 LAB — LIPID PANEL
Chol/HDL Ratio: 2.6 ratio (ref 0.0–4.4)
Cholesterol, Total: 131 mg/dL (ref 100–199)
HDL: 51 mg/dL (ref >39)
LDL Chol Calc (NIH): 59 mg/dL (ref 0–99)
Triglycerides: 120 mg/dL (ref 0–149)
VLDL Cholesterol Cal: 21 mg/dL (ref 5–40)

## 2024-09-15 LAB — COMPREHENSIVE METABOLIC PANEL WITH GFR
ALT: 26 IU/L (ref 0–32)
AST: 26 IU/L (ref 0–40)
Albumin: 4.5 g/dL (ref 3.7–4.7)
Alkaline Phosphatase: 101 IU/L (ref 48–129)
BUN/Creatinine Ratio: 23 (ref 12–28)
BUN: 19 mg/dL (ref 8–27)
Bilirubin Total: 0.4 mg/dL (ref 0.0–1.2)
CO2: 19 mmol/L — ABNORMAL LOW (ref 20–29)
Calcium: 9.8 mg/dL (ref 8.7–10.3)
Chloride: 102 mmol/L (ref 96–106)
Creatinine, Ser: 0.82 mg/dL (ref 0.57–1.00)
Globulin, Total: 2.1 g/dL (ref 1.5–4.5)
Glucose: 110 mg/dL — ABNORMAL HIGH (ref 70–99)
Potassium: 3.9 mmol/L (ref 3.5–5.2)
Sodium: 138 mmol/L (ref 134–144)
Total Protein: 6.6 g/dL (ref 6.0–8.5)
eGFR: 70 mL/min/1.73 (ref >59)

## 2024-09-15 LAB — URINALYSIS, ROUTINE W REFLEX MICROSCOPIC

## 2024-09-15 LAB — MICROALBUMIN / CREATININE URINE RATIO

## 2024-09-16 ENCOUNTER — Ambulatory Visit: Payer: Self-pay | Admitting: Medical

## 2024-09-16 ENCOUNTER — Other Ambulatory Visit: Payer: Self-pay | Admitting: Medical

## 2024-09-16 DIAGNOSIS — E1169 Type 2 diabetes mellitus with other specified complication: Secondary | ICD-10-CM

## 2024-09-16 NOTE — Progress Notes (Signed)
 Results through MyChart  FYI Prior osteoporosis treatments have been Boniva , Fosamax , Prolia .  Prolia  was discontinued due to funding discontinuation and co-pay too expensive

## 2024-09-17 ENCOUNTER — Other Ambulatory Visit

## 2024-09-17 ENCOUNTER — Ambulatory Visit (HOSPITAL_COMMUNITY)
Admission: RE | Admit: 2024-09-17 | Discharge: 2024-09-17 | Disposition: A | Discharge status: Home / Self Care | Source: Ambulatory Visit | Attending: Medical | Admitting: Medical

## 2024-09-17 DIAGNOSIS — Z794 Long term (current) use of insulin: Secondary | ICD-10-CM | POA: Diagnosis not present

## 2024-09-17 DIAGNOSIS — R0989 Other specified symptoms and signs involving the circulatory and respiratory systems: Secondary | ICD-10-CM | POA: Insufficient documentation

## 2024-09-17 DIAGNOSIS — E1169 Type 2 diabetes mellitus with other specified complication: Secondary | ICD-10-CM | POA: Diagnosis not present

## 2024-09-17 DIAGNOSIS — E785 Hyperlipidemia, unspecified: Secondary | ICD-10-CM | POA: Insufficient documentation

## 2024-09-17 LAB — MICROSCOPIC EXAMINATION

## 2024-09-17 LAB — VAS US ABI WITH/WO TBI
Left ABI: 1.24
Right ABI: 1.21

## 2024-09-18 LAB — MICROALBUMIN / CREATININE URINE RATIO
Creatinine, Urine: 144.8 mg/dL
Microalb/Creat Ratio: 7 mg/g{creat} (ref 0–29)
Microalbumin, Urine: 9.7 ug/mL

## 2024-09-18 LAB — URINALYSIS, ROUTINE W REFLEX MICROSCOPIC
Glucose, UA: NEGATIVE
Nitrite, UA: NEGATIVE
RBC, UA: NEGATIVE
Specific Gravity, UA: 1.023 (ref 1.005–1.030)
Urobilinogen, Ur: 1 mg/dL (ref 0.2–1.0)
pH, UA: 7 (ref 5.0–7.5)

## 2024-09-18 LAB — MICROSCOPIC EXAMINATION
Casts: NONE SEEN
Renal Epithel, UA: NONE SEEN /LPF

## 2024-09-18 NOTE — Progress Notes (Signed)
 Microalbumin kidney marker was okay but she did have a couple red cells in her urine.    Send copy of labs to urology.  She does have a history of recurrent urinary tract infection and is on Macrobid  for suppression

## 2024-09-18 NOTE — Progress Notes (Signed)
 Results thru my chart

## 2024-09-19 DIAGNOSIS — H02824 Cysts of left upper eyelid: Secondary | ICD-10-CM | POA: Diagnosis not present

## 2024-09-21 NOTE — Telephone Encounter (Signed)
 Received approval letter [employer]) pt is approved thru 10/31/2025,approval letter index.

## 2024-09-25 ENCOUNTER — Ambulatory Visit: Payer: Self-pay | Admitting: Medical

## 2024-09-25 ENCOUNTER — Ambulatory Visit: Admitting: Medical

## 2024-10-19 ENCOUNTER — Telehealth: Payer: Self-pay

## 2024-10-19 NOTE — Progress Notes (Signed)
" ° °  10/19/2024  Patient ID: Marissa Oliver, female   DOB: 1941-09-17, 83 y.o.   MRN: [id number]  Faxed in completed application to [facility] for Trulicity 4.5mg  once weekly. This will replace Ozempic  if approved, since the Ozempic  PAP is ending, pending company decision.  [name], PharmD Clinical Pharmacist [phone]  "

## 2024-10-19 NOTE — Telephone Encounter (Signed)
 Call pt still have not received pap [employer](Lantus ) for re-enrollment pt ask to mail out pap.

## 2024-11-05 ENCOUNTER — Other Ambulatory Visit (HOSPITAL_COMMUNITY): Payer: Self-pay

## 2024-11-05 DIAGNOSIS — R200 Anesthesia of skin: Secondary | ICD-10-CM

## 2024-11-05 MED ORDER — GABAPENTIN 100 MG PO CAPS: 100.0000 mg | ORAL_CAPSULE | Freq: Every day | ORAL | 1 refills | Status: AC

## 2024-11-05 NOTE — Addendum Note (Signed)
 Addended by: Careem Yasui on: 11/05/2024 03:56 PM   Modules accepted: Orders

## 2024-11-05 NOTE — Telephone Encounter (Signed)
 Copied from CRM [id number]. Topic: Clinical - Medication Prior Auth >> Nov 05, 2024 12:22 PM Alfonso [name] wrote: Reason for CRM: patient needs a new RX and PA for a 90 day supply sent for gabapentin  (NEURONTIN ) 100 MG capsule. She is also concerned about rapid weight loss in the last week.

## 2024-11-05 NOTE — Telephone Encounter (Signed)
 Received pt portion [employer](Lantus )waiting on provider portion faxed to provider office today.

## 2024-11-06 ENCOUNTER — Telehealth: Payer: Self-pay

## 2024-11-06 NOTE — Progress Notes (Signed)
" ° °  11/06/2024  Patient ID: Marissa Oliver, female   DOB: [birth year], 84 y.o.   MRN: [id number]  Patient called in reporting that her CGM device keeps alerting for low BG, but she feels fine and when she pricks her finger, does not have anywhere near a low BG.  Recommend changing to a new sensor and patient will contact company to report discrepancy and request a replacement.  [name], PharmD Clinical Pharmacist [phone]  "

## 2024-11-07 MED ORDER — LANCETS MISC: 1.0000 | 0 refills | Status: AC

## 2024-11-07 MED ORDER — BLOOD GLUCOSE TEST VI STRP: ORAL_STRIP | 0 refills | Status: AC

## 2024-11-07 MED ORDER — BLOOD GLUCOSE MONITORING SUPPL DEVI: 0 refills | Status: AC

## 2024-11-07 MED ORDER — LANCET DEVICE MISC: 0 refills | Status: AC

## 2024-11-08 ENCOUNTER — Other Ambulatory Visit (INDEPENDENT_AMBULATORY_CARE_PROVIDER_SITE_OTHER): Payer: Self-pay

## 2024-11-08 DIAGNOSIS — Z794 Long term (current) use of insulin: Secondary | ICD-10-CM

## 2024-11-08 DIAGNOSIS — E1169 Type 2 diabetes mellitus with other specified complication: Secondary | ICD-10-CM

## 2024-11-08 NOTE — Progress Notes (Signed)
 "  11/08/2024 Name: Marissa Oliver MRN: [id number] DOB: 1941-09-28  Chief Complaint  Patient presents with   Medication Management   Diabetes    Marissa Oliver is a 84 y.o. year old female who presented via telephone today for phone call visit.   They were referred to the pharmacist by their PCP for assistance in managing diabetes and medication access.    Subjective:  Care Team: Primary Care Provider: Bulah Alm RAMAN, PA-C    Medication Access/Adherence  Current Pharmacy:  [facility] - [place], [place] - [facility] AT [facility] [address] [place] [address] Phone: [phone] Fax: [phone]   Patient reports affordability concerns with their medications: No  Patient reports access/transportation concerns to their pharmacy: No  Patient reports adherence concerns with their medications:  No     Diabetes:  Current medications: Ozempic  2mg  weekly on Sundays, Lantus  3 units Medications tried in the past: Farxiga , Levemir , Glyburide , Tresiba , Metformin , Janmet, Lantus   Current glucose readings:  Having issues with patient connecting to office today, reporting controlled readings Getting low BG alarms but checking with finger prick and not low (>100)   Patient denies hypoglycemic s/sx including dizziness, shakiness, sweating. Patient denies hyperglycemic symptoms including polyuria, polydipsia, polyphagia, nocturia, neuropathy, blurred vision.  Current meal patterns:  Tries to be mindful of carbs but states she could be better   Current medication access support: Lantus  through [employer], Trulicity through [employer]  Aware that Ozempic  program with [employer] is ending for 2026  Plan to transition to Trulicity once Ozempic  supply has finished   Objective:  Lab Results  Component Value Date   HGBA1C 7.0 (H) 09/13/2024    Lab Results  Component Value Date   CREATININE 0.82 09/13/2024   BUN  19 09/13/2024   NA 138 09/13/2024   K 3.9 09/13/2024   CL 102 09/13/2024   CO2 19 (L) 09/13/2024    Lab Results  Component Value Date   CHOL 131 09/13/2024   HDL 51 09/13/2024   LDLCALC 59 09/13/2024   TRIG 120 09/13/2024   CHOLHDL 2.6 09/13/2024    Medications Reviewed Today     Reviewed by [name], RPH (Pharmacist) on 11/08/24 at [phone]  Med List Status: <None>   Medication Order Taking? Sig Documenting Provider Last Dose Status Informant  aspirin  EC 81 MG tablet [id number]  Take 1 tablet (81 mg total) by mouth daily. Tysinger, Alm RAMAN, PA-C  Active            Med Note [name]   Thu Dec 08, 2023  9:01 AM) qod  Blood Glucose Monitoring Suppl DEVI [id number]  Test 1-2 times day. Pend on [employer], Alm RAMAN, PA-C  Active   Continuous Glucose Sensor (FREESTYLE LIBRE 3 PLUS SENSOR) [place] [id number]  Use as directed to continuously monitor blood sugars. Change sensor every 15 days. Tysinger, [name], PA-C  Active   Fiber POWD [id number]  Take 1 Dose by mouth as needed. [provider]  Active   gabapentin  (NEURONTIN ) 100 MG capsule [id number]  Take 1 capsule (100 mg total) by mouth at bedtime. Vita Morrow, MD  Active   Glucose Blood (BLOOD GLUCOSE TEST STRIPS) STRP [id number]  Test 1-2 times daily Bulah Alm RAMAN, PA-C  Active   insulin  glargine (LANTUS ) 100 unit/mL SOPN [id number]  Inject 10 Units into the skin daily.  Patient taking differently: Inject 3 Units into the skin daily.  Tysinger, Alm RAMAN, PA-C  Active            Med Note ABE, [facility]   Thu Sep 13, 2024  1:41 PM) 3 units  Insulin  Pen Needle (B-D UF III MINI PEN NEEDLES) 31G X 5 MM MISC [id number]  USE ONCE DAILY Bulah Alm RAMAN [name]  Active   Lancet Device MISC [id number]  1-2 times daily Bulah Alm RAMAN [name]  Active   Lancets MISC [id number]  1 each by Does not apply route as directed. Dispense based on patient and insurance preference. Use up to four times daily as directed. (FOR  ICD-10 E10.9, E11.9). Tysinger, Alm RAMAN, PA-C  Active   latanoprost (XALATAN) 0.005 % ophthalmic solution [id number]  SMARTSIG:In Eye(s) [provider]  Active   LINZESS  290 MCG CAPS capsule [id number]  Take 1 capsule (290 mcg total) by mouth daily before breakfast. Take 1, Every morning before breakfast [place], PA-C  Active   lisinopril  (ZESTRIL ) 5 MG tablet [id number]  Take 1 tablet (5 mg total) by mouth daily. Tysinger, Alm RAMAN, PA-C  Active   nitrofurantoin , macrocrystal-monohydrate, (MACROBID ) 100 MG capsule [id number]  Take 100 mg by mouth daily. [provider]  Active   omeprazole  (PRILOSEC) 40 MG capsule [id number]  Take 1 capsule (40 mg total) by mouth every morning. Take 30 minutes before breakfast. [place], PA-C  Active   polyethylene glycol powder (GLYCOLAX /MIRALAX ) 17 GM/SCOOP powder [id number]  Take 17 g by mouth daily. [provider]  Active   rosuvastatin  (CRESTOR ) 20 MG tablet [id number]  Take 1 tablet (20 mg total) by mouth daily. Tysinger, Alm RAMAN, PA-C  Active   Semaglutide , 1 MG/DOSE, 4 MG/3ML SOPN [id number]  Inject 1 mg into the skin once a week. Tysinger, Alm RAMAN, PA-C  Active   vitamin B-6 (PYRIDOXINE) 25 MG tablet [id number]  Take 1 tablet (25 mg total) by mouth daily. Tysinger, Alm RAMAN, PA-C  Active               Assessment/Plan:   Diabetes: - Currently controlled  - Reviewed long term cardiovascular and renal outcomes of uncontrolled blood sugar - Reviewed goal A1c, goal fasting, and goal 2 hour post prandial glucose - Reviewed dietary modifications including low carb diet - Patient denies personal or family history of multiple endocrine neoplasia type 2, medullary thyroid  cancer; personal history of pancreatitis or gallbladder disease -Continue lantus  and Ozempic  2mg . For future, transition to Trulicity once ozempic  supply runs out (1 week after last Ozempic  shot). -Contact us  sooner than next follow up for any questions or  concerns.   Follow Up Plan: 1 month  [name], PharmD Clinical Pharmacist [phone]   "

## 2024-11-09 NOTE — Telephone Encounter (Signed)
 Received provider portion pap [employer], but missing ICD-10 code and tax Id# faxed back for provider to filled in needed imf.

## 2024-11-13 ENCOUNTER — Ambulatory Visit: Admitting: Physician Assistant

## 2024-11-13 ENCOUNTER — Encounter: Payer: Self-pay | Admitting: Physician Assistant

## 2024-11-13 VITALS — BP 119/62 | HR 84 | Ht 60.0 in | Wt 145.0 lb

## 2024-11-13 DIAGNOSIS — R634 Abnormal weight loss: Secondary | ICD-10-CM | POA: Diagnosis not present

## 2024-11-13 DIAGNOSIS — K760 Fatty (change of) liver, not elsewhere classified: Secondary | ICD-10-CM

## 2024-11-13 DIAGNOSIS — K5909 Other constipation: Secondary | ICD-10-CM | POA: Diagnosis not present

## 2024-11-13 NOTE — Patient Instructions (Signed)
 Continue taking your Linzess  290 mcg once daily   Please follow up sooner if symptoms increase or worsen  Due to recent changes in healthcare laws, you may see the results of your imaging and laboratory studies on MyChart before your provider has had a chance to review them.  We understand that in some cases there may be results that are confusing or concerning to you. Not all laboratory results come back in the same time frame and the provider may be waiting for multiple results in order to interpret others.  Please give us  48 hours in order for your provider to thoroughly review all the results before contacting the office for clarification of your results.   Thank you for trusting me with your gastrointestinal care!   Ellouise Console, PA-C _______________________________________________________  If your blood pressure at your visit was 140/90 or greater, please contact your primary care physician to follow up on this.  _______________________________________________________  If you are age 51 or older, your body mass index should be between 23-30. Your Body mass index is 28.32 kg/m. If this is out of the aforementioned range listed, please consider follow up with your Primary Care Provider.  If you are age 30 or younger, your body mass index should be between 19-25. Your Body mass index is 28.32 kg/m. If this is out of the aformentioned range listed, please consider follow up with your Primary Care Provider.   ________________________________________________________  The [facility] providers would like to encourage you to use MYCHART to communicate with providers for non-urgent requests or questions.  Due to long hold times on the telephone, sending your provider a message by [facility] may be a faster and more efficient way to get a response.  Please allow 48 business hours for a response.  Please remember that this is for non-urgent requests.   _______________________________________________________

## 2024-11-13 NOTE — Progress Notes (Signed)
 "     Marissa Console, PA-C [address] [place], [place]  [address] Phone: [phone]   Primary Care Physician: Bulah Alm RAMAN, PA-C  Primary Gastroenterologist:  Marissa Console, PA-C / Elspeth Naval, MD   Chief Complaint: Follow-up abdominal pain       HPI:   Discussed the use of AI scribe software for clinical note transcription with the patient, who gave verbal consent to proceed.  I last saw patient 06/2024 to evaluate constipation, abdominal pain, and weight loss.  She was thought to have adverse side effects of Ozempic  GLP-1.  Treated with Linzess  290 once daily for constipation.  09/2024 Labs:  Normal CBC and CMP.  06/2024 abdominal pelvic CT with contrast:  No acute findings.  Colonic diverticulosis, but no diverticulitis.  Mild hepatic steatosis.  History of Present Illness Marissa Oliver is an 84 year old female with chronic constipation who presents for follow-up of constipation and recent weight loss.  Bowel Habit Abnormalities: - Chronic constipation requiring daily Linzess  for management - Irregular bowel movements without medication; previous regularity lost - Both 290 mcg and 190 mcg doses of Linzess  provide minimal difference in effect - Linzess  withheld on morning of visit due to concern for excessive effect - Intermittent bloating - Use of a 'reconstitution' has provided some improvement in bloating - No current abdominal pain - Stools appear yellow; no history of red or black stools  Abnormal Weight Loss: - Weight decreased from 164 lbs in February 2025 to 147 lbs currently - Weight stable over past two months - Attributes weight loss to Ozempic , which is being discontinued; transitioning to Trulicity - No changes in appetite or eating habits - Remains hungry; others have commented on her weight loss  Imaging and Laboratory Findings: - Recent abdominal and pelvic CT scan and blood work were normal - Imaging revealed mild fatty liver, which is a  concern for her   08/2010 last colonoscopy by Dr. Kristie: 11 small tubular adenoma and hyperplastic polyps removed.  No further colonoscopies due to advanced age.   She had previous hysterectomy.     Current Outpatient Medications  Medication Sig Dispense Refill   aspirin  EC 81 MG tablet Take 1 tablet (81 mg total) by mouth daily. 90 tablet 3   Blood Glucose Monitoring Suppl DEVI Test 1-2 times day. Pend on Insurance 1 each 0   Continuous Glucose Sensor (FREESTYLE LIBRE 3 PLUS SENSOR) MISC Use as directed to continuously monitor blood sugars. Change sensor every 15 days. 6 each 3   Dulaglutide (TRULICITY) 4.5 MG/0.5ML SOAJ Inject into the skin.     Fiber POWD Take 1 Dose by mouth as needed.     gabapentin  (NEURONTIN ) 100 MG capsule Take 1 capsule (100 mg total) by mouth at bedtime. 90 capsule 1   Glucose Blood (BLOOD GLUCOSE TEST STRIPS) STRP Test 1-2 times daily 100 strip 0   insulin  glargine (LANTUS ) 100 unit/mL SOPN Inject 10 Units into the skin daily. (Patient taking differently: Inject 3 Units into the skin daily.)     Insulin  Pen Needle (B-D UF III MINI PEN NEEDLES) 31G X 5 MM MISC USE ONCE DAILY 300 each 1   Lancet Device MISC 1-2 times daily 1 each 0   Lancets MISC 1 each by Does not apply route as directed. Dispense based on patient and insurance preference. Use up to four times daily as directed. (FOR ICD-10 E10.9, E11.9). 100 each 0   latanoprost (XALATAN) 0.005 % ophthalmic solution  SMARTSIG:In Eye(s)     LINZESS  290 MCG CAPS capsule Take 1 capsule (290 mcg total) by mouth daily before breakfast. Take 1, Every morning before breakfast 90 capsule 1   lisinopril  (ZESTRIL ) 5 MG tablet Take 1 tablet (5 mg total) by mouth daily. 90 tablet 1   nitrofurantoin , macrocrystal-monohydrate, (MACROBID ) 100 MG capsule Take 100 mg by mouth daily.     omeprazole  (PRILOSEC) 40 MG capsule Take 1 capsule (40 mg total) by mouth every morning. Take 30 minutes before breakfast. 90 capsule 3    polyethylene glycol powder (GLYCOLAX /MIRALAX ) 17 GM/SCOOP powder Take 17 g by mouth daily.     rosuvastatin  (CRESTOR ) 20 MG tablet Take 1 tablet (20 mg total) by mouth daily. 90 tablet 1   vitamin B-6 (PYRIDOXINE) 25 MG tablet Take 1 tablet (25 mg total) by mouth daily. 90 tablet 0   Semaglutide , 1 MG/DOSE, 4 MG/3ML SOPN Inject 1 mg into the skin once a week. (Patient not taking: Reported on 11/13/2024) 3 mL 3   No current facility-administered medications for this visit.    Allergies as of 11/13/2024 - Review Complete 11/13/2024  Allergen Reaction Noted   Farxiga  [dapagliflozin ]  02/02/2022    Past Medical History:  Diagnosis Date   Atrophic vaginitis    estrace  cream rx'd by Dr. Ottelin   Colon polyp    tubular adenoma (colonoscopy q5 yrs)   Diabetes mellitus 2005   Diverticulosis    Dyslipidemia    GAD (generalized anxiety disorder)    Hemorrhoids    Hyperlipidemia    OAB (overactive bladder)    Osteoporosis    Psoriasis    mild, sees [facility]    Past Surgical History:  Procedure Laterality Date   ABDOMINAL HYSTERECTOMY  11/01/1970   partial   APPENDECTOMY     CARDIOVASCULAR STRESS TEST  12/25/2004   EF 65%. OVERALL NORMAL STRESS CARDIOLITE. NO EVIDENCE  OF ISCHEMIA   COLONOSCOPY  08/01/2010   cologuard 01/2016 normal   CYSTOSCOPY  03/2023   At [facility]   PUBOVAGINAL SLING  04/01/1998   TUBAL LIGATION      Review of Systems:    All systems reviewed and negative except where noted in HPI.    Physical Exam:  BP 119/62   Pulse 84   Ht 5' (1.524 m)   Wt 145 lb (65.8 kg)   BMI 28.32 kg/m  No LMP recorded. Patient is postmenopausal.  General: Well-nourished, well-developed in no acute distress.  Lungs: Clear to auscultation bilaterally. Non-labored. Heart: Regular rate and rhythm, no murmurs rubs or gallops.  Abdomen: Bowel sounds are normal; Abdomen is Soft; No hepatosplenomegaly, masses or hernias;  No Abdominal Tenderness; No guarding or  rebound tenderness. Neuro: Alert and oriented x 3.  Grossly intact.  Psych: Alert and cooperative, normal mood and affect.   Imaging Studies: No results found.  Labs: CBC    Component Value Date/Time   WBC 6.1 09/13/2024 1454   WBC 6.6 06/15/2024 1142   RBC 4.46 09/13/2024 1454   RBC 4.45 06/15/2024 1142   HGB 13.3 09/13/2024 1454   HCT 41.0 09/13/2024 1454   PLT 200 09/13/2024 1454   MCV 92 09/13/2024 1454   MCH 29.8 09/13/2024 1454   MCH 29.7 05/18/2017 0842   MCHC 32.4 09/13/2024 1454   MCHC 34.3 06/15/2024 1142   RDW 13.0 09/13/2024 1454   LYMPHSABS 1.8 06/15/2024 1142   LYMPHSABS 1.9 01/10/2023 1203   MONOABS 0.4 06/15/2024 1142   EOSABS  0.1 06/15/2024 1142   EOSABS 0.1 01/10/2023 1203   BASOSABS 0.0 06/15/2024 1142   BASOSABS 0.0 01/10/2023 1203    CMP     Component Value Date/Time   NA 138 09/13/2024 1454   K 3.9 09/13/2024 1454   CL 102 09/13/2024 1454   CO2 19 (L) 09/13/2024 1454   GLUCOSE 110 (H) 09/13/2024 1454   GLUCOSE 146 (H) 06/15/2024 1142   BUN 19 09/13/2024 1454   CREATININE 0.82 09/13/2024 1454   CREATININE 0.70 05/18/2017 0842   CALCIUM  9.8 09/13/2024 1454   PROT 6.6 09/13/2024 1454   ALBUMIN 4.5 09/13/2024 1454   AST 26 09/13/2024 1454   ALT 26 09/13/2024 1454   ALKPHOS 101 09/13/2024 1454   BILITOT 0.4 09/13/2024 1454   GFRNONAA 76 12/02/2020 1138   GFRAA 88 12/02/2020 1138       Assessment and Plan:   Marissa Oliver is a 84 y.o. y/o female  returns for followup of:  Abdominal Pain Chronic constipation Weight loss: Stable and Improved History of Colon Polyps  Recent abdominal pelvic CT and labs were unrevealing.  GI symptoms most likely due adverse side effects of to GLP-1 Ozempic . Assessment & Plan Chronic constipation Chronic constipation with persistent bloating and incomplete relief on current therapy; no evidence of obstructive pathology or acute process. - Continue Linzess  290 mcg daily. - Take Linzess  upon  returning home if dose was missed this morning. - Reduce Linzess  dose if diarrhea develops after discontinuing semaglutide . - No further diagnostic testing indicated.  Unintentional weight loss Gradual, non-acute weight loss over the past year, now stabilized; most likely secondary to semaglutide  therapy, with no evidence of malignancy or other concerning pathology on recent imaging and laboratory studies. - Discontinue semaglutide  due to weight loss. - Discuss transition with primary care provider. - No further diagnostic testing indicated.  Mild hepatic steatosis Mild fatty liver identified on recent imaging, without evidence of cirrhosis or other liver pathology; currently not concerning and amenable to lifestyle modification. - Reassured regarding mild hepatic steatosis. - Advised that weight loss, low-fat diet, and exercise are beneficial for fatty liver. - No further diagnostic testing or intervention indicated.   Marissa Console, PA-C  Follow up as needed.   "

## 2024-11-14 NOTE — Progress Notes (Signed)
 Agree with assessment and plan as outlined.

## 2024-11-22 NOTE — Telephone Encounter (Signed)
 Received provider portion [employer]( Lantus ) provider information faxed to [employer] along pt portion and ins. Card.

## 2024-11-28 NOTE — Telephone Encounter (Signed)
 Received approval letter from [employer] (Lantus ) thru 10/31/2025,approval letter index.

## 2024-12-06 ENCOUNTER — Ambulatory Visit

## 2024-12-13 ENCOUNTER — Ambulatory Visit
